# Patient Record
Sex: Female | Born: 1970 | Race: White | Hispanic: No | Marital: Single | Smoking: Current every day smoker
Health system: Southern US, Community
[De-identification: ages and names within clinical notes are randomized; demographics above are authoritative.]

## PROBLEM LIST (undated history)

## (undated) DIAGNOSIS — K859 Acute pancreatitis without necrosis or infection, unspecified: Secondary | ICD-10-CM

## (undated) DIAGNOSIS — F329 Major depressive disorder, single episode, unspecified: Secondary | ICD-10-CM

## (undated) DIAGNOSIS — Z8619 Personal history of other infectious and parasitic diseases: Secondary | ICD-10-CM

## (undated) DIAGNOSIS — J449 Chronic obstructive pulmonary disease, unspecified: Secondary | ICD-10-CM

## (undated) DIAGNOSIS — R079 Chest pain, unspecified: Secondary | ICD-10-CM

## (undated) DIAGNOSIS — M1990 Unspecified osteoarthritis, unspecified site: Secondary | ICD-10-CM

## (undated) DIAGNOSIS — R519 Headache, unspecified: Secondary | ICD-10-CM

## (undated) DIAGNOSIS — D649 Anemia, unspecified: Secondary | ICD-10-CM

## (undated) DIAGNOSIS — R51 Headache: Secondary | ICD-10-CM

## (undated) DIAGNOSIS — R0689 Other abnormalities of breathing: Secondary | ICD-10-CM

## (undated) DIAGNOSIS — J45909 Unspecified asthma, uncomplicated: Secondary | ICD-10-CM

## (undated) DIAGNOSIS — K589 Irritable bowel syndrome without diarrhea: Secondary | ICD-10-CM

## (undated) DIAGNOSIS — F419 Anxiety disorder, unspecified: Secondary | ICD-10-CM

## (undated) DIAGNOSIS — F32A Depression, unspecified: Secondary | ICD-10-CM

## (undated) DIAGNOSIS — R002 Palpitations: Secondary | ICD-10-CM

## (undated) DIAGNOSIS — R0989 Other specified symptoms and signs involving the circulatory and respiratory systems: Secondary | ICD-10-CM

## (undated) HISTORY — PX: COLONOSCOPY: SHX5424

## (undated) HISTORY — DX: Major depressive disorder, single episode, unspecified: F32.9

## (undated) HISTORY — DX: Anxiety disorder, unspecified: F41.9

## (undated) HISTORY — DX: Other abnormalities of breathing: R06.89

## (undated) HISTORY — DX: Headache: R51

## (undated) HISTORY — DX: Chronic obstructive pulmonary disease, unspecified: J44.9

## (undated) HISTORY — DX: Personal history of other infectious and parasitic diseases: Z86.19

## (undated) HISTORY — PX: SKIN GRAFT: SHX250

## (undated) HISTORY — DX: Irritable bowel syndrome, unspecified: K58.9

## (undated) HISTORY — DX: Anemia, unspecified: D64.9

## (undated) HISTORY — DX: Headache, unspecified: R51.9

## (undated) HISTORY — DX: Chest pain, unspecified: R07.9

## (undated) HISTORY — DX: Other specified symptoms and signs involving the circulatory and respiratory systems: R09.89

## (undated) HISTORY — DX: Unspecified asthma, uncomplicated: J45.909

## (undated) HISTORY — DX: Depression, unspecified: F32.A

## (undated) HISTORY — DX: Unspecified osteoarthritis, unspecified site: M19.90

## (undated) HISTORY — DX: Acute pancreatitis without necrosis or infection, unspecified: K85.90

## (undated) HISTORY — PX: CARPAL TUNNEL RELEASE: SHX101

## (undated) HISTORY — DX: Palpitations: R00.2

---

## 2009-08-28 DIAGNOSIS — F411 Generalized anxiety disorder: Secondary | ICD-10-CM | POA: Insufficient documentation

## 2009-08-28 DIAGNOSIS — H532 Diplopia: Secondary | ICD-10-CM | POA: Insufficient documentation

## 2009-08-28 DIAGNOSIS — K861 Other chronic pancreatitis: Secondary | ICD-10-CM | POA: Insufficient documentation

## 2010-02-17 DIAGNOSIS — T3165 Burns involving 60-69% of body surface with 50-59% third degree burns: Secondary | ICD-10-CM | POA: Insufficient documentation

## 2013-09-01 DIAGNOSIS — F32A Depression, unspecified: Secondary | ICD-10-CM | POA: Insufficient documentation

## 2013-09-28 DIAGNOSIS — F17200 Nicotine dependence, unspecified, uncomplicated: Secondary | ICD-10-CM | POA: Insufficient documentation

## 2013-09-28 DIAGNOSIS — F4310 Post-traumatic stress disorder, unspecified: Secondary | ICD-10-CM | POA: Insufficient documentation

## 2013-10-30 DIAGNOSIS — F1421 Cocaine dependence, in remission: Secondary | ICD-10-CM | POA: Insufficient documentation

## 2013-10-30 DIAGNOSIS — F1020 Alcohol dependence, uncomplicated: Secondary | ICD-10-CM | POA: Insufficient documentation

## 2013-10-30 DIAGNOSIS — F1420 Cocaine dependence, uncomplicated: Secondary | ICD-10-CM | POA: Insufficient documentation

## 2013-10-30 DIAGNOSIS — Z87898 Personal history of other specified conditions: Secondary | ICD-10-CM | POA: Insufficient documentation

## 2014-08-08 DIAGNOSIS — IMO0002 Reserved for concepts with insufficient information to code with codable children: Secondary | ICD-10-CM | POA: Insufficient documentation

## 2014-08-08 DIAGNOSIS — Z0000 Encounter for general adult medical examination without abnormal findings: Secondary | ICD-10-CM | POA: Insufficient documentation

## 2015-05-21 ENCOUNTER — Ambulatory Visit (INDEPENDENT_AMBULATORY_CARE_PROVIDER_SITE_OTHER): Payer: Medicaid Other | Admitting: Podiatry

## 2015-05-21 ENCOUNTER — Encounter: Payer: Self-pay | Admitting: Podiatry

## 2015-05-21 VITALS — BP 67/47 | HR 87 | Resp 18

## 2015-05-21 DIAGNOSIS — M2040 Other hammer toe(s) (acquired), unspecified foot: Secondary | ICD-10-CM | POA: Diagnosis not present

## 2015-05-21 DIAGNOSIS — L84 Corns and callosities: Secondary | ICD-10-CM

## 2015-05-21 DIAGNOSIS — Q667 Congenital pes cavus: Secondary | ICD-10-CM | POA: Diagnosis not present

## 2015-05-21 DIAGNOSIS — M216X9 Other acquired deformities of unspecified foot: Secondary | ICD-10-CM
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
## 2015-06-16 ENCOUNTER — Ambulatory Visit (INDEPENDENT_AMBULATORY_CARE_PROVIDER_SITE_OTHER): Payer: Medicaid Other | Admitting: Podiatry

## 2015-06-16 ENCOUNTER — Encounter: Payer: Self-pay | Admitting: Podiatry

## 2015-06-16 VITALS — BP 129/83 | HR 100 | Resp 18

## 2015-06-16 DIAGNOSIS — L03031 Cellulitis of right toe: Secondary | ICD-10-CM | POA: Diagnosis not present

## 2015-06-16 DIAGNOSIS — L600 Ingrowing nail: Secondary | ICD-10-CM

## 2015-06-16 DIAGNOSIS — B351 Tinea unguium: Secondary | ICD-10-CM

## 2015-06-16 MED ORDER — HYDROCODONE-ACETAMINOPHEN 10-325 MG PO TABS: 1.0000 | ORAL_TABLET | Freq: Three times a day (TID) | ORAL | Status: DC | PRN

## 2015-06-16 MED ORDER — CEPHALEXIN 500 MG PO CAPS: 500.0000 mg | ORAL_CAPSULE | Freq: Two times a day (BID) | ORAL | Status: DC

## 2015-06-16 NOTE — Progress Notes (Signed)
P atient ID: Candace Howard, female   DOB: 1971/07/28, 45 y.o.   MRN: [id number]    This patient returns to the office with severe pain developing in her gig toe right foot.  She says the toenail is discolored and has been throbbing very bad.  She is experiencing severe pain walking.  There is mild redness but no drainage noted.  She presents for evaluation and treatment.  O  Dorsalis pedis and posterior tibial pulses are palpable B/L.  TG and CR WNL.  Her right hallux toenail is yellow and unattached to her nail plate.  Mild redness at proximal nail fold.    A.  Paronychia right hallux  Onychomycosis right hallux.  P.  Excision of nail and matrix right hallux toenail under local anesthesia. Neosporin/DSD.  I was unable to get full anesthesia on this patient.  I discovered she had pus under her nail as well as a membrane which was exquisitely painful despite 6cc of 2% lidocaine. Prescibed cephalexin and vicodin 7.5/300 mg for this patient.  RTC 1 week.

## 2015-06-25 ENCOUNTER — Encounter: Payer: Self-pay | Admitting: Podiatry

## 2015-06-25 ENCOUNTER — Ambulatory Visit: Payer: Medicaid Other | Admitting: Podiatry

## 2015-06-25 ENCOUNTER — Ambulatory Visit (INDEPENDENT_AMBULATORY_CARE_PROVIDER_SITE_OTHER): Payer: Medicaid Other | Admitting: Podiatry

## 2015-06-25 VITALS — BP 134/78 | HR 86 | Resp 12

## 2015-06-25 DIAGNOSIS — Z09 Encounter for follow-up examination after completed treatment for conditions other than malignant neoplasm: Secondary | ICD-10-CM

## 2015-06-25 NOTE — Progress Notes (Signed)
Patient ID: Candace PriestSharon Tuman, female   DOB: [birth year], 44 y.o.   MRN: [id number] This patient presents to the office following nail surgery for permanent removal right big toe.  She says her toe is extremely painful still.  It was difficult removing her bandage.  She says the toe looks good to her but is still painful.    Podiatric Exam: Vascular: dorsalis pedis and posterior tibial pulses are palpable bilateral. Capillary return is immediate. Temperature gradient is WNL. Skin turgor WNL  Sensorium: Normal Semmes Weinstein monofilament test. Normal tactile sensation bilaterally. Nail Exam: Examination of her right great toe reveals significant white tissue which is painful.  There is no evidence of redness or infection noted right hallux. Ulcer Exam: There is no evidence of ulcer or pre-ulcerative changes or infection. Orthopedic Exam: Muscle tone and strength are WNL. No limitations in general ROM. No crepitus or effusions noted.   S/p nail surgery right foot  P.  There is significanr masceration due to allergy to her bandage or due to her neosporin.  The toe is healing well aside from her skin masceration .  Told her to discontinue neosporin usage and air dry. RTC 2 weeks.

## 2015-07-04 ENCOUNTER — Telehealth: Payer: Self-pay | Admitting: *Deleted

## 2015-07-04 NOTE — Telephone Encounter (Signed)
Pt called stated, "I want my Oxycodone."  I informed pt that Oxycodone had not be prescribed by Dr. Stacie AcresMayer, and Vicodin that was prescribed after her procedure was a one time rx.  I instructed pt to begin [employer] soaks and apply antibiotic ointment, and if she could tolerate Ibuprofen take it as prescribed on the OTC bottle for the discomfort and inflammation.  Pt agreed.

## 2015-07-09 ENCOUNTER — Encounter: Payer: Self-pay | Admitting: Podiatry

## 2015-07-09 ENCOUNTER — Ambulatory Visit (INDEPENDENT_AMBULATORY_CARE_PROVIDER_SITE_OTHER): Payer: Medicaid Other | Admitting: Podiatry

## 2015-07-09 VITALS — BP 122/78 | HR 87 | Resp 18

## 2015-07-09 DIAGNOSIS — Z09 Encounter for follow-up examination after completed treatment for conditions other than malignant neoplasm: Secondary | ICD-10-CM

## 2015-07-09 NOTE — Progress Notes (Signed)
Subjective:     Patient ID: Candace PriestSharon Howard, female   DOB: [birth year], 44 y.o.   MRN: [id number]  HPIThis patient returns following nail surgery for removal of big toenail right foot.  She says her toe is occasionally painful but she has no drainage and is walking better.  She presents for evaluation and treatment.   Review of Systems     Objective:   Physical Exam GENERAL APPEARANCE: Alert, conversant. Appropriately groomed. No acute distress.  VASCULAR: Pedal pulses palpable at 2/4 DP and PT bilateral.  Capillary refill time is immediate to all digits,  Proximal to distal cooling it warm to warm.  Digital hair growth is present bilateral  NEUROLOGIC: sensation is intact epicritically and protectively to 5.07 monofilament at 5/5 sites bilateral.  Light touch is intact bilateral, vibratory sensation intact bilateral, achilles tendon reflex is intact bilateral.  MUSCULOSKELETAL: acceptable muscle strength, tone and stability bilateral.  Intrinsic muscluature intact bilateral.  Rectus appearance of foot and digits noted bilateral.   DERMATOLOGIC: skin color, texture, and turgor are within normal limits.  No preulcerative lesions or ulcers  are seen, no interdigital maceration noted.  No open lesions present.  . No drainage noted. NAILS  The right hallux nail bed has no drainage.  There is hard callus present with no signs of redness or drainage or infection.    Assessment:     S/p nail surgery right foot     Plan:     ROV  Told to vaseline the nail bed.

## 2015-07-16 ENCOUNTER — Ambulatory Visit: Payer: Medicaid Other | Admitting: Podiatry

## 2017-10-13 ENCOUNTER — Ambulatory Visit: Payer: Medicaid Other | Admitting: Cardiology

## 2017-10-17 ENCOUNTER — Ambulatory Visit: Payer: Medicaid Other | Admitting: Cardiology

## 2017-10-24 ENCOUNTER — Encounter: Payer: Self-pay | Admitting: Cardiology

## 2017-10-24 ENCOUNTER — Ambulatory Visit: Payer: Medicaid Other | Admitting: Cardiology

## 2017-10-24 DIAGNOSIS — Z87891 Personal history of nicotine dependence: Secondary | ICD-10-CM | POA: Insufficient documentation

## 2017-10-24 DIAGNOSIS — F17200 Nicotine dependence, unspecified, uncomplicated: Secondary | ICD-10-CM | POA: Insufficient documentation

## 2017-10-24 DIAGNOSIS — E785 Hyperlipidemia, unspecified: Secondary | ICD-10-CM | POA: Insufficient documentation

## 2017-10-24 DIAGNOSIS — R0609 Other forms of dyspnea: Secondary | ICD-10-CM | POA: Insufficient documentation

## 2017-10-24 DIAGNOSIS — R0789 Other chest pain: Secondary | ICD-10-CM | POA: Diagnosis not present

## 2017-10-24 NOTE — Patient Instructions (Signed)
Medication Instructions:  Your physician recommends that you continue on your current medications as directed. Please refer to the Current Medication list given to you today.  Labwork: None   Testing/Procedures: Your physician has requested that you have an echocardiogram. Echocardiography is a painless test that uses sound waves to create images of your heart. It provides your doctor with information about the size and shape of your heart and how well your heart's chambers and valves are working. This procedure takes approximately one hour. There are no restrictions for this procedure.  Your physician has requested that you have a stress echocardiogram. For further information please visit [email or web address]. Please follow instruction sheet as given.  EKG today in office.   Follow-Up: Your physician recommends that you schedule a follow-up appointment in: 1 month  Any Other Special Instructions Will Be Listed Below (If Applicable).  Please note that any paperwork needing to be filled out by the provider will need to be addressed at the front desk prior to seeing the provider. Please note that any paperwork FMLA, Disability or other documents regarding health condition is subject to a $25.00 charge that must be received prior to completion of paperwork in the form of a money order or check.    If you need a refill on your cardiac medications before your next appointment, please call your pharmacy.
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
## 2017-11-01 DIAGNOSIS — L84 Corns and callosities: Secondary | ICD-10-CM | POA: Insufficient documentation

## 2017-11-09 ENCOUNTER — Other Ambulatory Visit (HOSPITAL_BASED_OUTPATIENT_CLINIC_OR_DEPARTMENT_OTHER): Payer: Medicaid Other

## 2017-11-16 ENCOUNTER — Other Ambulatory Visit (HOSPITAL_BASED_OUTPATIENT_CLINIC_OR_DEPARTMENT_OTHER): Payer: Medicaid Other

## 2017-11-21 ENCOUNTER — Other Ambulatory Visit (HOSPITAL_BASED_OUTPATIENT_CLINIC_OR_DEPARTMENT_OTHER): Payer: Medicaid Other

## 2017-11-21 ENCOUNTER — Ambulatory Visit (HOSPITAL_BASED_OUTPATIENT_CLINIC_OR_DEPARTMENT_OTHER)
Admission: RE | Admit: 2017-11-21 | Discharge: 2017-11-21 | Disposition: A | Discharge status: Home / Self Care | Payer: Medicaid Other | Source: Ambulatory Visit | Attending: Cardiology | Admitting: Cardiology

## 2017-11-21 DIAGNOSIS — R0609 Other forms of dyspnea: Secondary | ICD-10-CM | POA: Insufficient documentation

## 2017-11-21 DIAGNOSIS — R0600 Dyspnea, unspecified: Secondary | ICD-10-CM | POA: Insufficient documentation

## 2017-11-21 DIAGNOSIS — Z720 Tobacco use: Secondary | ICD-10-CM | POA: Diagnosis not present

## 2017-11-21 DIAGNOSIS — I340 Nonrheumatic mitral (valve) insufficiency: Secondary | ICD-10-CM | POA: Insufficient documentation

## 2017-11-21 DIAGNOSIS — R0789 Other chest pain: Secondary | ICD-10-CM | POA: Insufficient documentation

## 2017-11-21 DIAGNOSIS — E785 Hyperlipidemia, unspecified: Secondary | ICD-10-CM | POA: Insufficient documentation

## 2017-11-21 NOTE — Progress Notes (Signed)
Echocardiogram 2D Echocardiogram has been performed.  Candace Howard, Candace Howard 11/21/2017, 10:37 AM

## 2017-11-21 NOTE — Progress Notes (Signed)
  Echocardiogram 2D Echocardiogram has been performed.  Candace BasemanReel, Yuridia Couts M 11/21/2017, 10:43 AM

## 2017-11-23 ENCOUNTER — Ambulatory Visit: Payer: Medicaid Other | Admitting: Cardiology

## 2017-11-28 ENCOUNTER — Other Ambulatory Visit (HOSPITAL_BASED_OUTPATIENT_CLINIC_OR_DEPARTMENT_OTHER): Payer: Medicaid Other

## 2017-11-28 ENCOUNTER — Ambulatory Visit (HOSPITAL_BASED_OUTPATIENT_CLINIC_OR_DEPARTMENT_OTHER): Payer: Medicaid Other

## 2017-12-05 ENCOUNTER — Encounter: Payer: Self-pay | Admitting: Cardiology

## 2017-12-05 ENCOUNTER — Ambulatory Visit (INDEPENDENT_AMBULATORY_CARE_PROVIDER_SITE_OTHER): Payer: Medicaid Other | Admitting: Cardiology

## 2017-12-05 VITALS — BP 110/70 | HR 66 | Ht 63.5 in | Wt 150.4 lb

## 2017-12-05 DIAGNOSIS — F17200 Nicotine dependence, unspecified, uncomplicated: Secondary | ICD-10-CM | POA: Diagnosis not present

## 2017-12-05 DIAGNOSIS — R0609 Other forms of dyspnea: Secondary | ICD-10-CM | POA: Diagnosis not present

## 2017-12-05 DIAGNOSIS — R0789 Other chest pain: Secondary | ICD-10-CM | POA: Diagnosis not present

## 2017-12-05 DIAGNOSIS — E785 Hyperlipidemia, unspecified: Secondary | ICD-10-CM | POA: Diagnosis not present

## 2017-12-05 NOTE — Patient Instructions (Signed)
Medication Instructions:  Your physician recommends that you continue on your current medications as directed. Please refer to the Current Medication list given to you today.  Labwork: Your physician recommends that you have lab work today: Lipid panel to check your cholesterol  Testing/Procedures: None ordered  Follow-Up: Your physician recommends that you schedule a follow-up appointment in: 1 month with Dr. Bing MatterKrasowski  Any Other Special Instructions Will Be Listed Below (If Applicable).     If you need a refill on your cardiac medications before your next appointment, please call your pharmacy.

## 2017-12-05 NOTE — Addendum Note (Signed)
Addended by: Arville CareHUNT, Yang Rack N on: 12/05/2017 10:19 AM   Modules accepted: Orders
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
## 2017-12-06 LAB — LIPID PANEL
CHOL/HDL RATIO: 4.6 ratio — AB (ref 0.0–4.4)
Cholesterol, Total: 165 mg/dL (ref 100–199)
HDL: 36 mg/dL — ABNORMAL LOW (ref >39)
LDL Calculated: 87 mg/dL (ref 0–99)
TRIGLYCERIDES: 208 mg/dL — AB (ref 0–149)
VLDL Cholesterol Cal: 42 mg/dL — ABNORMAL HIGH (ref 5–40)

## 2017-12-08 ENCOUNTER — Other Ambulatory Visit: Payer: Self-pay

## 2017-12-08 MED ORDER — ATORVASTATIN CALCIUM 40 MG PO TABS: 40.0000 mg | ORAL_TABLET | Freq: Every day | ORAL | 6 refills | Status: DC

## 2017-12-19 ENCOUNTER — Ambulatory Visit (HOSPITAL_BASED_OUTPATIENT_CLINIC_OR_DEPARTMENT_OTHER): Payer: Medicaid Other

## 2018-01-09 ENCOUNTER — Ambulatory Visit: Payer: Medicaid Other | Admitting: Cardiology

## 2018-01-09 ENCOUNTER — Other Ambulatory Visit (HOSPITAL_BASED_OUTPATIENT_CLINIC_OR_DEPARTMENT_OTHER): Payer: Medicaid Other

## 2018-05-19 DIAGNOSIS — Z01818 Encounter for other preprocedural examination: Secondary | ICD-10-CM | POA: Diagnosis not present

## 2018-07-04 DIAGNOSIS — L03115 Cellulitis of right lower limb: Secondary | ICD-10-CM | POA: Diagnosis not present

## 2018-07-04 DIAGNOSIS — L02611 Cutaneous abscess of right foot: Secondary | ICD-10-CM

## 2018-07-05 DIAGNOSIS — L03115 Cellulitis of right lower limb: Secondary | ICD-10-CM | POA: Diagnosis not present

## 2018-07-05 DIAGNOSIS — L02611 Cutaneous abscess of right foot: Secondary | ICD-10-CM | POA: Diagnosis not present

## 2018-07-06 ENCOUNTER — Encounter: Payer: Self-pay | Admitting: Sports Medicine

## 2018-07-06 DIAGNOSIS — L03119 Cellulitis of unspecified part of limb: Secondary | ICD-10-CM

## 2018-07-06 DIAGNOSIS — L0291 Cutaneous abscess, unspecified: Secondary | ICD-10-CM

## 2018-07-06 DIAGNOSIS — L03115 Cellulitis of right lower limb: Secondary | ICD-10-CM | POA: Diagnosis not present

## 2018-07-06 DIAGNOSIS — L02611 Cutaneous abscess of right foot: Secondary | ICD-10-CM | POA: Diagnosis not present

## 2018-07-07 DIAGNOSIS — L02611 Cutaneous abscess of right foot: Secondary | ICD-10-CM | POA: Diagnosis not present

## 2018-07-07 DIAGNOSIS — L03115 Cellulitis of right lower limb: Secondary | ICD-10-CM | POA: Diagnosis not present

## 2018-07-19 ENCOUNTER — Encounter: Payer: Self-pay | Admitting: Sports Medicine

## 2018-07-19 ENCOUNTER — Ambulatory Visit (INDEPENDENT_AMBULATORY_CARE_PROVIDER_SITE_OTHER): Payer: Medicaid Other | Admitting: Sports Medicine

## 2018-07-19 ENCOUNTER — Encounter: Payer: Self-pay | Admitting: *Deleted

## 2018-07-19 VITALS — BP 109/58 | HR 74 | Temp 97.8°F | Resp 16

## 2018-07-19 DIAGNOSIS — Z9889 Other specified postprocedural states: Secondary | ICD-10-CM

## 2018-07-19 DIAGNOSIS — M79671 Pain in right foot: Secondary | ICD-10-CM

## 2018-07-19 MED ORDER — TRAMADOL HCL 50 MG PO TABS: 50.0000 mg | ORAL_TABLET | Freq: Four times a day (QID) | ORAL | 0 refills | Status: AC | PRN

## 2018-07-19 NOTE — Progress Notes (Signed)
Subjective: Candace Howard is a 47 y.o. female patient seen today in office for POV #1 (DOS 07-06-18), S/P right foot incision and drainage for abscess.  Patient admits pain at surgical site 5 out of 10 that sharp in nature admits that the special postoperative boot/which shoe is hurting her ankle states that she did have an episode on Monday night of nausea and vomiting however this has resolved and reports that she has been going weekly to the wound care center for local wound care.  Patient states that she is out of her tramadol to help with her pain but she has been trying to use her diclofenac or Voltaren which is not helping as much as the tramadol does.  Patient denies  calf pain, denies headache, chest pain, shortness of breath, nausea, vomiting, fever, or chills currently. No other issues noted.   Patient Active Problem List   Diagnosis Date Noted  . Atypical chest pain 10/24/2017  . Dyslipidemia 10/24/2017  . Smoking 10/24/2017  . Dyspnea on exertion 10/24/2017    Current Outpatient Medications on File Prior to Visit  Medication Sig Dispense Refill  . albuterol (PROAIR HFA) 108 (90 Base) MCG/ACT inhaler Inhale 2 puffs into the lungs every 6 (six) hours as needed for wheezing or shortness of breath.    Marland Kitchen. atorvastatin (LIPITOR) 40 MG tablet Take 1 tablet (40 mg total) by mouth daily. 30 tablet 6  . clonazePAM (KLONOPIN) 1 MG tablet Take 1 mg by mouth daily.    Marland Kitchen. gabapentin (NEURONTIN) 300 MG capsule Take 300 mg by mouth 3 (three) times daily.    . hydrOXYzine (ATARAX/VISTARIL) 25 MG tablet Take 25 mg by mouth 3 (three) times daily.    Marland Kitchen. omeprazole (PRILOSEC) 40 MG capsule Take 40 mg by mouth daily.    . promethazine (PHENERGAN) 25 MG tablet Take 6.25 mg by mouth as needed for nausea or vomiting.    . sertraline (ZOLOFT) 100 MG tablet Take 1 tablet by mouth daily.  0  . SUBOXONE 8-2 MG FILM place 2.5 films UNDER THE TONGUE DAILY  0  . tiotropium (SPIRIVA) 18 MCG inhalation capsule Place 18  mcg daily into inhaler and inhale.     No current facility-administered medications on file prior to visit.     No Known Allergies  Objective: There were no vitals filed for this visit.  General: No acute distress, AAOx3  Right foot: Retention Prolene sutures intact with no gapping or dehiscence at surgical site there is a granular I&D ulceration site that appears to be much improved in size compared to immediate postop there is mild surrounding soft tissue bruising, mild swelling t right footo, no erythema, no warmth, no active drainage, no signs of infection noted, Capillary fill time <3 seconds in all digits, gross sensation present via light touch to right foot.  Mild guarding with range of motion to the right foot.  No reproducible pain at the ankle however subjectively worse pain with wedge postop shoe.  No pain with calf compression.   Assessment and Plan:  Problem List Items Addressed This Visit    None    Visit Diagnoses    Right foot pain    -  Primary   Post-operative state           -Patient seen and evaluated -Applied dry sterile dressing to surgical site right foot secured with ACE wrap and stockinet  -Advised patient to make sure to keep dressings clean, dry, and intact to the right  foot and allow wound care center to assist with dressing changes of which they are currently using a calcium AlgiSite dressing to the area -Advised patient to continue with post-op shoe on right foot: Switch shoe from wedge shoe to flat postop shoe due to patient having history of pain in the ankle  -Prescribed tramadol to take as needed for pain and advised patient to continue with diclofenac as needed for mild pain and inflammation -Advised patient to limit activity to necessity  -Advised patient to ice and elevate as necessary  -Will plan for  follow-up wound check at next office visit. In the meantime, patient to call office if any issues or problems arise.   Asencion Islam, DPM

## 2018-07-25 ENCOUNTER — Telehealth: Payer: Self-pay | Admitting: Sports Medicine

## 2018-07-25 NOTE — Telephone Encounter (Signed)
I would like to get a copy of my medical records. Please call me as soon as you have them ready and I will come pick them up. You can reach me at [phone].

## 2018-07-27 ENCOUNTER — Telehealth: Payer: Self-pay | Admitting: Sports Medicine

## 2018-07-27 NOTE — Telephone Encounter (Signed)
I saw Dr. Marylene LandStover on Wednesday and my right ankle is swollen up and I don't know what from. If someone can give me a call back and tell me what I need to do, I would greatly appreciate it. My number is [phone]. Thank you.

## 2018-07-28 ENCOUNTER — Encounter: Payer: Self-pay | Admitting: Sports Medicine

## 2018-07-28 ENCOUNTER — Telehealth: Payer: Self-pay | Admitting: *Deleted

## 2018-07-28 ENCOUNTER — Ambulatory Visit: Payer: Self-pay

## 2018-07-28 ENCOUNTER — Ambulatory Visit (INDEPENDENT_AMBULATORY_CARE_PROVIDER_SITE_OTHER): Payer: Medicaid Other | Admitting: Sports Medicine

## 2018-07-28 ENCOUNTER — Telehealth: Payer: Self-pay | Admitting: Sports Medicine

## 2018-07-28 DIAGNOSIS — L03115 Cellulitis of right lower limb: Secondary | ICD-10-CM | POA: Diagnosis not present

## 2018-07-28 DIAGNOSIS — Z9889 Other specified postprocedural states: Secondary | ICD-10-CM

## 2018-07-28 DIAGNOSIS — M25579 Pain in unspecified ankle and joints of unspecified foot: Secondary | ICD-10-CM

## 2018-07-28 MED ORDER — SULFAMETHOXAZOLE-TRIMETHOPRIM 800-160 MG PO TABS: 1.0000 | ORAL_TABLET | Freq: Two times a day (BID) | ORAL | 0 refills | Status: DC

## 2018-07-28 MED ORDER — DICLOFENAC SODIUM 75 MG PO TBEC
75.0000 mg | DELAYED_RELEASE_TABLET | Freq: Two times a day (BID) | ORAL | 0 refills | Status: DC
Start: 2018-07-28 — End: 2021-09-29

## 2018-07-28 NOTE — Telephone Encounter (Signed)
I spoke with Dr. Wynema BirchStover's - M. Juan QuamParry, CMA and she stated Dr. Marylene LandStover would want to see her today. I informed pt and she stated she would get someone to drop her off there today. I told pt if she could not get to the [facility] in [place] to go directly to the ED. Pt states understanding.

## 2018-07-28 NOTE — Progress Notes (Signed)
Subjective: Candace PriestSharon Howard is a 47 y.o. female patient seen today in office for right ankle pain states that on Wednesday she thinks she may have twisted the ankle but has noted significant swelling and redness and has been painful for the last day where pressure or anything touching it or wrapped around her ankle makes it worse states that icing helps.  Patient is also status post right foot incision and drainage for abscess performed on 07/06/2018.  Patient denies any pain to the surgical site and states that her dressing was just changed earlier today at the wound care center, denies calf pain, denies headache, chest pain, shortness of breath, nausea, vomiting, fever, or chills currently. No other issues noted.   Patient Active Problem List   Diagnosis Date Noted  . Atypical chest pain 10/24/2017  . Dyslipidemia 10/24/2017  . Smoking 10/24/2017  . Dyspnea on exertion 10/24/2017    Current Outpatient Medications on File Prior to Visit  Medication Sig Dispense Refill  . albuterol (PROAIR HFA) 108 (90 Base) MCG/ACT inhaler Inhale 2 puffs into the lungs every 6 (six) hours as needed for wheezing or shortness of breath.    Marland Kitchen. atorvastatin (LIPITOR) 40 MG tablet Take 1 tablet (40 mg total) by mouth daily. 30 tablet 6  . clonazePAM (KLONOPIN) 1 MG tablet Take 1 mg by mouth daily.    Marland Kitchen. gabapentin (NEURONTIN) 300 MG capsule Take 300 mg by mouth 3 (three) times daily.    . hydrOXYzine (ATARAX/VISTARIL) 25 MG tablet Take 25 mg by mouth 3 (three) times daily.    Marland Kitchen. omeprazole (PRILOSEC) 40 MG capsule Take 40 mg by mouth daily.    . promethazine (PHENERGAN) 25 MG tablet Take 6.25 mg by mouth as needed for nausea or vomiting.    . sertraline (ZOLOFT) 100 MG tablet Take 1 tablet by mouth daily.  0  . SUBOXONE 8-2 MG FILM place 2.5 films UNDER THE TONGUE DAILY  0  . tiotropium (SPIRIVA) 18 MCG inhalation capsule Place 18 mcg daily into inhaler and inhale.     No current facility-administered medications on  file prior to visit.     No Known Allergies  Objective: There were no vitals filed for this visit.  General: No acute distress, AAOx3  Right foot/ankle: Collagen dressing in place over wound bed plantar aspect of first metatarsal right foot, no erythema, no warmth, no active drainage, no signs of infection noted, Capillary fill time <3 seconds in all digits, gross sensation present via light touch to right foot.  Mild guarding with range of motion to the right foot and ankle there is local swelling redness and warmth to the anterior and lateral aspect of the right ankle no pain or tenderness to calf no acute signs of DVT mild guarding with range of motion at the ankle.   X-rays right ankle No acute findings  Assessment and Plan:  Problem List Items Addressed This Visit    None    Visit Diagnoses    Acute ankle pain, unspecified laterality    -  Primary   Relevant Medications   diclofenac (VOLTAREN) 75 MG EC tablet   Other Relevant Orders   DG Ankle Complete Right   Post-operative state       Relevant Orders   DG Ankle Complete Right   Cellulitis of right lower extremity       Relevant Medications   sulfamethoxazole-trimethoprim (BACTRIM DS,SEPTRA DS) 800-160 MG tablet       -Patient seen and evaluated -  Applied dry sterile dressing to surgical site right foot secured with ACE wrap and stockinet  -Advised patient to make sure to keep dressings clean, dry, and intact to the right foot and allow wound care center to assist with dressing changes of which they are currently using a calcium AlgiSite dressing to the area -Advised patient to continue with post-op shoe on right foot -Prescribed  diclofenac as needed for mild pain and inflammation and advised patient to continue to ice for her ankle and also prescribed Bactrim for the redness and warmth to her leg and advised patient if worsen to go to ER -Advised patient to limit activity to necessity  -Will plan for  follow-up wound  check at next office visit. In the meantime, patient to call office if any issues or problems arise.   Asencion Islam, DPM

## 2018-07-28 NOTE — Telephone Encounter (Signed)
Pt called states her right leg is red, swollen, hot and very tender, the redness is going up the front of her leg, does Dr. Marylene LandStover want to see her now.

## 2018-07-28 NOTE — Telephone Encounter (Signed)
error 

## 2018-07-31 NOTE — Telephone Encounter (Signed)
Patient was seen in office on Friday 07/28/18

## 2018-08-02 ENCOUNTER — Encounter: Payer: Medicaid Other | Admitting: Sports Medicine

## 2018-09-14 ENCOUNTER — Ambulatory Visit: Payer: Medicaid Other | Admitting: Sports Medicine

## 2018-09-15 ENCOUNTER — Ambulatory Visit: Payer: Medicaid Other | Admitting: Sports Medicine

## 2018-09-21 ENCOUNTER — Ambulatory Visit (INDEPENDENT_AMBULATORY_CARE_PROVIDER_SITE_OTHER): Payer: Medicaid Other | Admitting: Sports Medicine

## 2018-09-21 ENCOUNTER — Telehealth: Payer: Self-pay | Admitting: *Deleted

## 2018-09-21 ENCOUNTER — Encounter: Payer: Self-pay | Admitting: Sports Medicine

## 2018-09-21 DIAGNOSIS — M79671 Pain in right foot: Secondary | ICD-10-CM

## 2018-09-21 DIAGNOSIS — M25579 Pain in unspecified ankle and joints of unspecified foot: Secondary | ICD-10-CM

## 2018-09-21 DIAGNOSIS — L03032 Cellulitis of left toe: Secondary | ICD-10-CM | POA: Diagnosis not present

## 2018-09-21 DIAGNOSIS — M7989 Other specified soft tissue disorders: Secondary | ICD-10-CM

## 2018-09-21 DIAGNOSIS — M25571 Pain in right ankle and joints of right foot: Secondary | ICD-10-CM

## 2018-09-21 DIAGNOSIS — L84 Corns and callosities: Secondary | ICD-10-CM

## 2018-09-21 DIAGNOSIS — L03115 Cellulitis of right lower limb: Secondary | ICD-10-CM

## 2018-09-21 DIAGNOSIS — M79672 Pain in left foot: Secondary | ICD-10-CM

## 2018-09-21 NOTE — Telephone Encounter (Signed)
-----   Message from Asencion Islam, [place] sent at 09/21/2018  2:41 PM EDT ----- Regarding: [facility] Ultrasound at right lower leg and ankle Soft tissue mass at ankle that is concerning for the patient
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
## 2018-09-22 NOTE — Telephone Encounter (Signed)
I informed pt of the 09/26/2018 [facility] [place] appt.

## 2018-09-22 NOTE — Telephone Encounter (Signed)
[employer] - MEDICAID APPROVED THE [place] [id number], SERVICE ORDER: [id number], AUTHORIZATION  EFFECTIVE DATE:  09/22/2018, AUTHORIZATION END DATE: 10/22/2018.

## 2018-09-22 NOTE — Telephone Encounter (Signed)
[facility] scheduled 09/26/2018 arrive 2:30pm for 3:00pm [place]. Faxed orders and [employer] authorization to [employer].

## 2018-09-29 ENCOUNTER — Encounter: Payer: Self-pay | Admitting: Sports Medicine

## 2021-07-05 ENCOUNTER — Other Ambulatory Visit: Payer: Self-pay

## 2021-07-05 ENCOUNTER — Ambulatory Visit (HOSPITAL_COMMUNITY)
Admission: EM | Admit: 2021-07-05 | Discharge: 2021-07-05 | Discharge status: Against Medical Advice | Payer: Medicaid Other

## 2021-09-29 ENCOUNTER — Ambulatory Visit: Payer: Medicaid Other | Admitting: Sports Medicine

## 2021-09-29 ENCOUNTER — Encounter: Payer: Self-pay | Admitting: Sports Medicine

## 2021-09-29 ENCOUNTER — Other Ambulatory Visit: Payer: Self-pay

## 2021-09-29 ENCOUNTER — Ambulatory Visit: Payer: Medicaid Other

## 2021-09-29 DIAGNOSIS — I739 Peripheral vascular disease, unspecified: Secondary | ICD-10-CM

## 2021-09-29 DIAGNOSIS — L97909 Non-pressure chronic ulcer of unspecified part of unspecified lower leg with unspecified severity: Secondary | ICD-10-CM | POA: Insufficient documentation

## 2021-09-29 DIAGNOSIS — I889 Nonspecific lymphadenitis, unspecified: Secondary | ICD-10-CM | POA: Insufficient documentation

## 2021-09-29 DIAGNOSIS — L03119 Cellulitis of unspecified part of limb: Secondary | ICD-10-CM | POA: Insufficient documentation

## 2021-09-29 DIAGNOSIS — L97421 Non-pressure chronic ulcer of left heel and midfoot limited to breakdown of skin: Secondary | ICD-10-CM | POA: Diagnosis not present

## 2021-09-29 DIAGNOSIS — M79672 Pain in left foot: Secondary | ICD-10-CM

## 2021-09-29 DIAGNOSIS — L0291 Cutaneous abscess, unspecified: Secondary | ICD-10-CM | POA: Insufficient documentation

## 2021-09-29 DIAGNOSIS — M19049 Primary osteoarthritis, unspecified hand: Secondary | ICD-10-CM | POA: Insufficient documentation
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
## 2021-10-07 LAB — WOUND CULTURE

## 2021-10-14 ENCOUNTER — Ambulatory Visit: Payer: Medicaid Other | Admitting: Sports Medicine

## 2021-11-17 ENCOUNTER — Telehealth: Payer: Self-pay

## 2021-11-17 ENCOUNTER — Other Ambulatory Visit: Payer: Self-pay

## 2021-11-17 ENCOUNTER — Ambulatory Visit: Payer: Medicaid Other | Admitting: Sports Medicine

## 2021-11-17 DIAGNOSIS — L97421 Non-pressure chronic ulcer of left heel and midfoot limited to breakdown of skin: Secondary | ICD-10-CM

## 2021-11-17 DIAGNOSIS — I739 Peripheral vascular disease, unspecified: Secondary | ICD-10-CM | POA: Diagnosis not present

## 2021-11-17 DIAGNOSIS — M79672 Pain in left foot: Secondary | ICD-10-CM | POA: Diagnosis not present

## 2021-11-17 MED ORDER — PROMETHAZINE HCL 25 MG PO TABS: 25.0000 mg | ORAL_TABLET | Freq: Three times a day (TID) | ORAL | 0 refills | Status: DC | PRN

## 2021-11-17 MED ORDER — AMOXICILLIN-POT CLAVULANATE 875-125 MG PO TABS: 1.0000 | ORAL_TABLET | Freq: Two times a day (BID) | ORAL | 0 refills | Status: DC

## 2021-11-17 NOTE — Telephone Encounter (Signed)
Candace Howard from [facility] called stating they would not be able to take in the pt due to staff issues
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
## 2021-11-17 NOTE — Telephone Encounter (Signed)
Tried contacting pt but phone sounded disconnected, was not able to speak to pt.

## 2021-12-09 ENCOUNTER — Ambulatory Visit: Payer: Medicaid Other | Admitting: Sports Medicine

## 2021-12-24 ENCOUNTER — Emergency Department (HOSPITAL_COMMUNITY): Payer: Medicaid Other

## 2021-12-24 ENCOUNTER — Other Ambulatory Visit: Payer: Self-pay

## 2021-12-24 ENCOUNTER — Encounter (HOSPITAL_COMMUNITY): Payer: Self-pay

## 2021-12-24 ENCOUNTER — Emergency Department (HOSPITAL_COMMUNITY)
Admission: EM | Admit: 2021-12-24 | Discharge: 2021-12-26 | Disposition: A | Discharge status: Other Acute Inpatient Hospital | Payer: Medicaid Other | Attending: Emergency Medicine | Admitting: Emergency Medicine

## 2021-12-24 DIAGNOSIS — F333 Major depressive disorder, recurrent, severe with psychotic symptoms: Secondary | ICD-10-CM | POA: Diagnosis not present

## 2021-12-24 DIAGNOSIS — F419 Anxiety disorder, unspecified: Secondary | ICD-10-CM | POA: Insufficient documentation

## 2021-12-24 DIAGNOSIS — F4310 Post-traumatic stress disorder, unspecified: Secondary | ICD-10-CM | POA: Insufficient documentation

## 2021-12-24 DIAGNOSIS — R45851 Suicidal ideations: Secondary | ICD-10-CM

## 2021-12-24 DIAGNOSIS — Z20822 Contact with and (suspected) exposure to covid-19: Secondary | ICD-10-CM | POA: Diagnosis not present

## 2021-12-24 DIAGNOSIS — Y900 Blood alcohol level of less than 20 mg/100 ml: Secondary | ICD-10-CM | POA: Diagnosis not present

## 2021-12-24 NOTE — ED Provider Triage Note (Signed)
Emergency Medicine Provider Triage Evaluation Note  Candace Howard , a 51 y.o. female  was evaluated in triage.  Pt complains of suicidal ideations and visual hallucinations.  Patient states she is been struggling mentally and has a lot on her plate.  She has not used any drugs in the last 40 days.  She has been off her risperidone for the last 3 months.  She is complaining of left ankle pain where she had a previous infection.  Review of Systems  Positive:  Negative: See above   Physical Exam  BP 131/89 (BP Location: Left Arm)    Pulse (!) 104    Temp 98 F (36.7 C) (Oral)    Resp 20    Ht 5\' 4"  (1.626 m)    Wt 64.9 kg    SpO2 100%    BMI 24.55 kg/m  Gen:   Awake, no distress   Resp:  Normal effort  MSK:   Moves extremities without difficulty  Other:    Medical Decision Making  Medically screening exam initiated at 10:48 PM.  Appropriate orders placed.  Candace Howard was informed that the remainder of the evaluation will be completed by another provider, this initial triage assessment does not replace that evaluation, and the importance of remaining in the ED until their evaluation is complete.     Candace Howard, [place] 12/24/21 2249

## 2021-12-24 NOTE — ED Triage Notes (Signed)
Pt reports suicidal ideation with no plan. Says she has not been getting her Risperdal shots in several months.

## 2021-12-25 ENCOUNTER — Other Ambulatory Visit: Payer: Self-pay | Admitting: Psychiatry

## 2021-12-25 LAB — CBC
HCT: 40.9 % (ref 36.0–46.0)
Hemoglobin: 13.2 g/dL (ref 12.0–15.0)
MCH: 31.5 pg (ref 26.0–34.0)
MCHC: 32.3 g/dL (ref 30.0–36.0)
MCV: 97.6 fL (ref 80.0–100.0)
Platelets: 269 10*3/uL (ref 150–400)
RBC: 4.19 MIL/uL (ref 3.87–5.11)
RDW: 14 % (ref 11.5–15.5)
WBC: 8.4 10*3/uL (ref 4.0–10.5)
nRBC: 0 % (ref 0.0–0.2)

## 2021-12-25 LAB — COMPREHENSIVE METABOLIC PANEL
ALT: 16 U/L (ref 0–44)
AST: 19 U/L (ref 15–41)
Albumin: 4 g/dL (ref 3.5–5.0)
Alkaline Phosphatase: 92 U/L (ref 38–126)
Anion gap: 12 (ref 5–15)
BUN: 6 mg/dL (ref 6–20)
CO2: 26 mmol/L (ref 22–32)
Calcium: 8.7 mg/dL — ABNORMAL LOW (ref 8.9–10.3)
Chloride: 96 mmol/L — ABNORMAL LOW (ref 98–111)
Creatinine, Ser: 0.75 mg/dL (ref 0.44–1.00)
GFR, Estimated: >60 mL/min (ref >60)
Glucose, Bld: 126 mg/dL — ABNORMAL HIGH (ref 70–99)
Potassium: 4.3 mmol/L (ref 3.5–5.1)
Sodium: 134 mmol/L — ABNORMAL LOW (ref 135–145)
Total Bilirubin: 0.6 mg/dL (ref 0.3–1.2)
Total Protein: 8 g/dL (ref 6.5–8.1)

## 2021-12-25 LAB — LIPID PANEL
Cholesterol: 193 mg/dL (ref 0–200)
HDL: 48 mg/dL (ref >40)
LDL Cholesterol: 130 mg/dL — ABNORMAL HIGH (ref 0–99)
Total CHOL/HDL Ratio: 4 RATIO
Triglycerides: 76 mg/dL (ref <150)
VLDL: 15 mg/dL (ref 0–40)

## 2021-12-25 LAB — ETHANOL: Alcohol, Ethyl (B): <10 mg/dL (ref <10)

## 2021-12-25 LAB — SALICYLATE LEVEL: Salicylate Lvl: <7 mg/dL — ABNORMAL LOW (ref 7.0–30.0)

## 2021-12-25 LAB — RAPID URINE DRUG SCREEN, HOSP PERFORMED
Amphetamines: NOT DETECTED
Barbiturates: NOT DETECTED
Benzodiazepines: NOT DETECTED
Cocaine: NOT DETECTED
Opiates: NOT DETECTED
Tetrahydrocannabinol: NOT DETECTED

## 2021-12-25 LAB — HEMOGLOBIN A1C
Hgb A1c MFr Bld: 5.4 % (ref 4.8–5.6)
Mean Plasma Glucose: 108.28 mg/dL

## 2021-12-25 LAB — ACETAMINOPHEN LEVEL: Acetaminophen (Tylenol), Serum: <10 ug/mL — ABNORMAL LOW (ref 10–30)

## 2021-12-25 LAB — RESP PANEL BY RT-PCR (FLU A&B, COVID) ARPGX2
Influenza A by PCR: NEGATIVE
Influenza B by PCR: NEGATIVE
SARS Coronavirus 2 by RT PCR: NEGATIVE

## 2021-12-25 LAB — TSH: TSH: 0.636 u[IU]/mL (ref 0.350–4.500)

## 2021-12-25 LAB — HCG, QUANTITATIVE, PREGNANCY: hCG, Beta Chain, Quant, S: <1 m[IU]/mL (ref <5)

## 2021-12-25 MED ORDER — NICOTINE 21 MG/24HR TD PT24
21.0000 mg | MEDICATED_PATCH | Freq: Every day | TRANSDERMAL | Status: DC
  Administered 2021-12-25: 21 mg via TRANSDERMAL
  Filled 2021-12-25: qty 1

## 2021-12-25 MED ORDER — LORAZEPAM 1 MG PO TABS
1.0000 mg | ORAL_TABLET | Freq: Once | ORAL | Status: AC
Start: 2021-12-25 — End: 2021-12-25
  Administered 2021-12-25: 1 mg via ORAL
  Filled 2021-12-25: qty 1

## 2021-12-25 MED ORDER — BENZONATATE 100 MG PO CAPS
100.0000 mg | ORAL_CAPSULE | Freq: Three times a day (TID) | ORAL | Status: DC | PRN
  Administered 2021-12-25: 100 mg via ORAL
  Filled 2021-12-25: qty 1

## 2021-12-25 MED ORDER — ALBUTEROL SULFATE HFA 108 (90 BASE) MCG/ACT IN AERS
2.0000 | INHALATION_SPRAY | Freq: Once | RESPIRATORY_TRACT | Status: AC
  Administered 2021-12-25: 2 via RESPIRATORY_TRACT
  Filled 2021-12-25: qty 6.7

## 2021-12-25 NOTE — ED Provider Notes (Signed)
Emergency Medicine Observation Re-evaluation Note  Candace Howard is a 51 y.o. female, seen on rounds today.  Pt initially presented to the ED for complaints of Suicidal Currently, the patient is calm, cooperative.  Physical Exam  BP 115/88    Pulse 93    Temp 98 F (36.7 C) (Oral)    Resp 17    Ht 5\' 4"  (1.626 m)    Wt 64.9 kg    SpO2 99%    BMI 24.55 kg/m  Physical Exam General: No acute distress Cardiac: Regular rate Lungs: No respiratory distress Psych: Calm  ED Course / MDM  EKG:   I have reviewed the labs performed to date as well as medications administered while in observation.  Recent changes in the last 24 hours include no acute changes. Blood work results still pending. Still awaiting recommendations by psychiatry team Plan  Current plan is for await psychiatry evaluation.  Candace Howard is not under involuntary commitment.     Varney Biles, MD 12/25/21 1031

## 2021-12-25 NOTE — ED Notes (Signed)
Unable to draw enough blood for I-stat beta, ordered hcg quantitative but accidentally clicked off [employer]
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
## 2021-12-25 NOTE — BH Assessment (Addendum)
@  1044, Clinician requested Teressa Senter, RN, to set up the the TTS machine for patient's initial TTS assessment.

## 2021-12-25 NOTE — Progress Notes (Signed)
Pt was accepted to [facility] 12/25/21 at 2130; Bed Assignment 401-1  Pt meets inpatient criteria per Caryn Bee, DNP,  Attending Physician will be MD Bartholomew Crews  Report can be called to: Adult unit: [phone]  Pt can arrive after 2130  Care Team notified via secure chat: [facility] Malva Limes, RN, Granville Lewis, RN, Derwood Kaplan, MD, Edd Arbour, Maxie Barb, Teressa Senter, RN, and Reinaldo Meeker, FNP. Pt must sign the voluntary consent and fax that back to [phone], original to accompany at transport.  Kelton Pillar, LCSWA 12/25/2021 @ 5:50 PM

## 2021-12-25 NOTE — ED Notes (Signed)
Multiple unsuccessful attempts on blood work. Pt wants to hold off on being poked anymore.

## 2021-12-25 NOTE — ED Notes (Signed)
Attempted to call report to [facility], facility requesting report be called after 2300.

## 2021-12-26 ENCOUNTER — Encounter (HOSPITAL_COMMUNITY): Payer: Self-pay | Admitting: Psychiatry

## 2021-12-26 ENCOUNTER — Inpatient Hospital Stay (HOSPITAL_COMMUNITY)
Admission: AD | Admit: 2021-12-26 | Discharge: 2021-12-30 | DRG: 885 | Disposition: A | Discharge status: Home / Self Care | Payer: Medicaid Other | Source: Intra-hospital | Attending: Psychiatry | Admitting: Psychiatry

## 2021-12-26 ENCOUNTER — Other Ambulatory Visit: Payer: Self-pay

## 2021-12-26 DIAGNOSIS — F329 Major depressive disorder, single episode, unspecified: Secondary | ICD-10-CM | POA: Diagnosis present

## 2021-12-26 DIAGNOSIS — Z5986 Financial insecurity: Secondary | ICD-10-CM | POA: Diagnosis not present

## 2021-12-26 DIAGNOSIS — Z9151 Personal history of suicidal behavior: Secondary | ICD-10-CM | POA: Diagnosis not present

## 2021-12-26 DIAGNOSIS — F410 Panic disorder [episodic paroxysmal anxiety] without agoraphobia: Secondary | ICD-10-CM | POA: Diagnosis present

## 2021-12-26 DIAGNOSIS — Z79899 Other long term (current) drug therapy: Secondary | ICD-10-CM

## 2021-12-26 DIAGNOSIS — Z62810 Personal history of physical and sexual abuse in childhood: Secondary | ICD-10-CM | POA: Diagnosis present

## 2021-12-26 DIAGNOSIS — J45909 Unspecified asthma, uncomplicated: Secondary | ICD-10-CM | POA: Diagnosis present

## 2021-12-26 DIAGNOSIS — G629 Polyneuropathy, unspecified: Secondary | ICD-10-CM | POA: Diagnosis present

## 2021-12-26 DIAGNOSIS — F4310 Post-traumatic stress disorder, unspecified: Secondary | ICD-10-CM | POA: Diagnosis present

## 2021-12-26 DIAGNOSIS — R519 Headache, unspecified: Secondary | ICD-10-CM | POA: Diagnosis present

## 2021-12-26 DIAGNOSIS — R32 Unspecified urinary incontinence: Secondary | ICD-10-CM | POA: Diagnosis present

## 2021-12-26 DIAGNOSIS — Z20822 Contact with and (suspected) exposure to covid-19: Secondary | ICD-10-CM | POA: Diagnosis present

## 2021-12-26 DIAGNOSIS — Z5900 Homelessness unspecified: Secondary | ICD-10-CM

## 2021-12-26 DIAGNOSIS — F17210 Nicotine dependence, cigarettes, uncomplicated: Secondary | ICD-10-CM | POA: Diagnosis present

## 2021-12-26 DIAGNOSIS — Z885 Allergy status to narcotic agent status: Secondary | ICD-10-CM | POA: Diagnosis not present

## 2021-12-26 DIAGNOSIS — F333 Major depressive disorder, recurrent, severe with psychotic symptoms: Secondary | ICD-10-CM | POA: Diagnosis present

## 2021-12-26 DIAGNOSIS — G252 Other specified forms of tremor: Secondary | ICD-10-CM | POA: Diagnosis present

## 2021-12-26 DIAGNOSIS — F1411 Cocaine abuse, in remission: Secondary | ICD-10-CM | POA: Diagnosis present

## 2021-12-26 DIAGNOSIS — R45851 Suicidal ideations: Secondary | ICD-10-CM | POA: Diagnosis present

## 2021-12-26 DIAGNOSIS — R251 Tremor, unspecified: Secondary | ICD-10-CM | POA: Diagnosis present

## 2021-12-26 DIAGNOSIS — Z818 Family history of other mental and behavioral disorders: Secondary | ICD-10-CM | POA: Diagnosis not present

## 2021-12-26 DIAGNOSIS — M1990 Unspecified osteoarthritis, unspecified site: Secondary | ICD-10-CM | POA: Diagnosis present

## 2021-12-26 DIAGNOSIS — R9431 Abnormal electrocardiogram [ECG] [EKG]: Secondary | ICD-10-CM | POA: Diagnosis present

## 2021-12-26 DIAGNOSIS — F411 Generalized anxiety disorder: Secondary | ICD-10-CM | POA: Diagnosis present

## 2021-12-26 DIAGNOSIS — Z7951 Long term (current) use of inhaled steroids: Secondary | ICD-10-CM

## 2021-12-26 DIAGNOSIS — E871 Hypo-osmolality and hyponatremia: Secondary | ICD-10-CM | POA: Diagnosis present

## 2021-12-26 DIAGNOSIS — R059 Cough, unspecified: Secondary | ICD-10-CM

## 2021-12-26 MED ORDER — ENSURE ENLIVE PO LIQD
237.0000 mL | Freq: Two times a day (BID) | ORAL | Status: DC
  Administered 2021-12-26 – 2021-12-27 (×2): 237 mL via ORAL
  Filled 2021-12-26 (×7): qty 237

## 2021-12-26 MED ORDER — BENZONATATE 100 MG PO CAPS
100.0000 mg | ORAL_CAPSULE | Freq: Three times a day (TID) | ORAL | Status: DC | PRN
  Administered 2021-12-26 – 2021-12-28 (×4): 100 mg via ORAL
  Filled 2021-12-26 (×4): qty 1

## 2021-12-26 MED ORDER — RISPERIDONE 1 MG PO TABS
1.0000 mg | ORAL_TABLET | Freq: Every day | ORAL | Status: DC
  Administered 2021-12-26 – 2021-12-29 (×4): 1 mg via ORAL
  Filled 2021-12-26 (×7): qty 1

## 2021-12-26 MED ORDER — GABAPENTIN 300 MG PO CAPS
300.0000 mg | ORAL_CAPSULE | Freq: Every day | ORAL | Status: DC
  Administered 2021-12-26: 300 mg via ORAL
  Filled 2021-12-26 (×2): qty 1

## 2021-12-26 MED ORDER — ALBUTEROL SULFATE HFA 108 (90 BASE) MCG/ACT IN AERS
2.0000 | INHALATION_SPRAY | RESPIRATORY_TRACT | Status: DC | PRN
  Administered 2021-12-27 – 2021-12-28 (×3): 2 via RESPIRATORY_TRACT

## 2021-12-26 MED ORDER — MOMETASONE FURO-FORMOTEROL FUM 100-5 MCG/ACT IN AERO
2.0000 | INHALATION_SPRAY | Freq: Two times a day (BID) | RESPIRATORY_TRACT | Status: DC
  Administered 2021-12-26 – 2021-12-30 (×9): 2 via RESPIRATORY_TRACT
  Filled 2021-12-26 (×2): qty 8.8

## 2021-12-26 MED ORDER — ALBUTEROL SULFATE (2.5 MG/3ML) 0.083% IN NEBU
2.5000 mg | INHALATION_SOLUTION | Freq: Four times a day (QID) | RESPIRATORY_TRACT | Status: DC | PRN
  Administered 2021-12-26 – 2021-12-29 (×4): 2.5 mg via RESPIRATORY_TRACT
  Filled 2021-12-26 (×4): qty 3

## 2021-12-26 MED ORDER — NICOTINE 14 MG/24HR TD PT24
14.0000 mg | MEDICATED_PATCH | Freq: Every day | TRANSDERMAL | Status: DC
  Administered 2021-12-26 – 2021-12-30 (×5): 14 mg via TRANSDERMAL
  Filled 2021-12-26 (×6): qty 1

## 2021-12-26 MED ORDER — LOPERAMIDE HCL 2 MG PO CAPS
2.0000 mg | ORAL_CAPSULE | ORAL | Status: DC | PRN
  Administered 2021-12-26: 2 mg via ORAL
  Filled 2021-12-26: qty 1

## 2021-12-26 MED ORDER — BENZONATATE 100 MG PO CAPS
100.0000 mg | ORAL_CAPSULE | Freq: Once | ORAL | Status: AC
  Administered 2021-12-26: 100 mg via ORAL
  Filled 2021-12-26 (×2): qty 1

## 2021-12-26 MED ORDER — HYDROXYZINE HCL 25 MG PO TABS
25.0000 mg | ORAL_TABLET | Freq: Three times a day (TID) | ORAL | Status: DC | PRN
  Administered 2021-12-26 – 2021-12-30 (×9): 25 mg via ORAL
  Filled 2021-12-26 (×9): qty 1

## 2021-12-26 MED ORDER — ALBUTEROL SULFATE HFA 108 (90 BASE) MCG/ACT IN AERS
2.0000 | INHALATION_SPRAY | Freq: Once | RESPIRATORY_TRACT | Status: AC
  Administered 2021-12-26: 2 via RESPIRATORY_TRACT
  Filled 2021-12-26 (×2): qty 6.7
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
## 2021-12-26 NOTE — Progress Notes (Signed)
Pt still coughing. Provider gave verbal order for one-time dose of Albuterol inhaler 2 puffs. Medication administered. Will continue to monitor.
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
## 2021-12-26 NOTE — H&P (Addendum)
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 to assist with discharge planning and identification of hospital follow-up needs prior to discharge Estimated LOS: 5-7 days Discharge Concerns: Need to establish a safety plan; Medication compliance and effectiveness Discharge Goals: Return home with outpatient referrals for mental health follow-up including medication management/psychotherapy  Observation Level/Precautions:  Elopement, suicide, assault  Laboratory: See above  Psychotherapy:  Patient  will be encouraged attend groups  Medications:  See above treatment plan  Consultations:    Discharge Concerns:    Estimated LOS:  Other:     Physician Treatment Plan for Primary Diagnosis: MDD (major depressive disorder), recurrent, severe, with psychosis ([place]) Long Term Goal(s): Improvement in symptoms so as ready for discharge  Short Term Goals: Ability to identify changes in lifestyle to reduce recurrence of condition will improve, Ability to verbalize feelings will improve, Ability to disclose and discuss suicidal ideas, Ability to demonstrate self-control will improve, Ability to identify and develop effective coping behaviors will improve, Ability to maintain clinical measurements within normal limits will improve, Compliance with prescribed medications will improve, and Ability to identify triggers associated with substance abuse/mental health issues will improve  Physician Treatment Plan for Secondary Diagnosis: Principal Problem:   MDD (major depressive disorder), recurrent, severe, with psychosis ([place])  Long Term Goal(s): Improvement in symptoms so as ready for discharge  Short Term  Goals: Ability to identify changes in lifestyle to reduce recurrence of condition will improve, Ability to verbalize feelings will improve, Ability to disclose and discuss suicidal ideas, Ability to demonstrate self-control will improve, Ability to identify and develop effective coping behaviors will improve, Ability to maintain clinical measurements within normal limits will improve, Compliance with prescribed medications will improve, and Ability to identify triggers associated with substance abuse/mental health issues will improve  I certify that inpatient services furnished can reasonably be expected to improve the patient's condition.    [name], MD 1/7/20233:27 PM

## 2021-12-26 NOTE — Progress Notes (Signed)
Patient denies SI, HI and AVH this shift. Patient has been complaining of coughing, diarrhea and stress incontinence. Patient has participated in groups and has been compliant for medications.   Offer patient medication as prescribed, engage patient in 1:1 staff talks, assess patient for safety.   Continue to monitor as planned. Patient able to contract for safety.

## 2021-12-26 NOTE — Progress Notes (Signed)
Pt could be heard coughing down the hallway. Pt having bouts of stress urinary incontinence also with the force of the coughing. Provider notified and given one-time dose of Tessalon 100 mg. This was previously given in the ED to pt along with an inhaler.  Pt given meds and also mesh panties and incontinence pads. Pt had urinary accident at med window when she began coughing again.

## 2021-12-26 NOTE — Progress Notes (Signed)
Pt admitted voluntarily to [facility] inpatient adult unit due to experiencing suicidal ideation.  Pt reports becoming homeless a few days ago.  Pt stated she went to a AA meeting and was told she should get help.  She agreed and went to [facility] for help.  Pt feels she is on an emotional war path.  She feels something is messing with her but is unable to identify what that is.  She states it is at a point it is about to drive her crazy.  Pt reports she has been unemployed since November.  Pt denies current drug and alcohol use.  Pt states she has not been compliant with medications for several months. She states she has a problem taking her medicines on her own.  Unable to verbalize way.  Pt denied SI, HI.  Pt endorses AVH.  Pt sees a young lady's head and is unable to remember her auditory hallucinations.  Fifteen minute checks initiated for patient safety.  Pt safe on unit.

## 2021-12-26 NOTE — [facility] Suicide Risk Assessment (Signed)
[facility] INPATIENT:  Family/Significant Other Suicide Prevention Education  Suicide Prevention Education:  Patient Refusal for Family/Significant Other Suicide Prevention Education: The patient Candace Howard has refused to provide written consent for family/significant other to be provided Family/Significant Other Suicide Prevention Education during admission and/or prior to discharge.  Physician notified.  SPE completed with pt, as pt refused to consent to family contact. SPI pamphlet provided to pt and pt was encouraged to share information with support network, ask questions, and talk about any concerns relating to SPE. Pt denies access to guns/firearms and verbalized understanding of information provided. Mobile Crisis information also provided to pt.    Rona Ravens LCSW 12/26/2021, 2:23 PM

## 2021-12-26 NOTE — Progress Notes (Signed)
Patient has been in her room the majority of the night. She has been coughing a lot and received a neb treatment along with tessalon pearls to help her have a productive cough. She did sit in the dayroom briefly and ate snack and watched some tv. She was given a pitcher of water and encouraged to drink plenty of fluids. Support given and safety maintained on unit with 15 min checks.

## 2021-12-26 NOTE — Progress Notes (Signed)
Adult Psychoeducational Group Note  Date:  12/26/2021 Time:  8:26 PM  Group Topic/Focus:  Wrap-Up Group:   The focus of this group is to help patients review their daily goal of treatment and discuss progress on daily workbooks.  Participation Level:  Did Not Attend  Participation Quality:   Did Not Attend  Affect:   Did Not Attend  Cognitive:   Did Not Attend  Insight: None  Engagement in Group:   Did Not Attend  Modes of Intervention:   Did Not Attend  Additional Comments:  Pt did not attend evening wrap up group tonight.  Felipa Furnace 12/26/2021, 8:26 PM

## 2021-12-26 NOTE — Group Note (Unsigned)
Date:  12/26/2021 °Time:  9:45 AM ° °Group Topic/Focus:  °Orientation:   The focus of this group is to educate the patient on the purpose and policies of crisis stabilization and provide a format to answer questions about their admission.  The group details unit policies and expectations of patients while admitted. ° ° ° ° °Participation Level:  {[facility] PARTICIPATION LEVEL:22264} ° °Participation Quality:  {[facility] PARTICIPATION QUALITY:22265} ° °Affect:  {B[facility] AFFECT:22266} ° °Cognitive:  {[facility] COGNITIVE:22267} ° °Insight: {BHH Insight2:20797} ° °Engagement in Group:  {B[facility] ENGAGEMENT IN GROUP:22268} ° °Modes of Intervention:  {[facility] MODES OF INTERVENTION:22269} ° °Additional Comments:  *** ° °Candace Howard °12/26/2021, 9:45 AM ° °

## 2021-12-26 NOTE — [facility] Group Notes (Signed)
Goals Group 12/26/21    Group Focus: affirmation, clarity of thought, and goals/reality orientation Treatment Modality:  Psychoeducation Interventions utilized were assignment, group exercise, and support Purpose: To be able to understand and verbalize the reason for their admission to the hospital. To understand that the medication helps with their chemical imbalance but they also need to work on their choices in life. To be challenged to develop a list of 30 positives about themselves. Also introduce the concept that "feelings" are not reality.  Participation Level:  Active  Participation Quality:  Appropriate  Affect:  Appropriate  Cognitive:  Appropriate  Insight:  Improving  Engagement in Group:  Engaged  Additional Comments:  Rates energy at a 2/10  Candace Howard A
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
## 2021-12-26 NOTE — Tx Team (Addendum)
Initial Treatment Plan 12/26/2021 2:49 AM Candace Howard [id number]    PATIENT STRESSORS: Medication non-compliance homeless   PATIENT STRENGTHS: Ability for insight  Average or above average intelligence  Communication skills  General fund of knowledge  Motivation for treatment/growth    PATIENT IDENTIFIED PROBLEMS: depression  Suicidal ideation  Medication non-compliace                 DISCHARGE CRITERIA:  Ability to meet basic life and health needs Improved stabilization in mood, thinking, and/or behavior Motivation to continue treatment in a less acute level of care Need for constant or close observation no longer present Reduction of life-threatening or endangering symptoms to within safe limits Verbal commitment to aftercare and medication compliance  PRELIMINARY DISCHARGE PLAN: Outpatient therapy Return to previous living arrangement  PATIENT/FAMILY INVOLVEMENT: This treatment plan has been presented to and reviewed with the patient, Candace Howard, and/or family member.  The patient and family have been given the opportunity to ask questions and make suggestions.  Hoover Browns, RN 12/26/2021, 2:49 AM

## 2021-12-26 NOTE — [facility] Group Notes (Addendum)
Psychoeducational Group Note    Date:12/26/21 Time: 1300-1400    Purpose of Group: . The group focus' on teaching patients on how to identify their needs and their Life Skills:  A group where two lists are made. What people need and what are things that we do that are unhealthy. The lists are developed by the patients and it is explained that we often do the actions that are not healthy to get our list of needs met.  Goal:: to develop the coping skills needed to get their needs met  Participation Level:  Active  Participation Quality:  Appropriate  Affect:  Appropriate  Cognitive:  Oriented  Insight:  Improving  Engagement in Group:  Engaged  Additional Comments:  Rates her energy at a 3/10. Participated in the group.  Paulino Rily

## 2021-12-27 ENCOUNTER — Inpatient Hospital Stay (HOSPITAL_COMMUNITY): Payer: Medicaid Other

## 2021-12-27 LAB — BASIC METABOLIC PANEL
Anion gap: 9 (ref 5–15)
BUN: 11 mg/dL (ref 6–20)
CO2: 26 mmol/L (ref 22–32)
Calcium: 8.3 mg/dL — ABNORMAL LOW (ref 8.9–10.3)
Chloride: 103 mmol/L (ref 98–111)
Creatinine, Ser: 0.7 mg/dL (ref 0.44–1.00)
GFR, Estimated: >60 mL/min (ref >60)
Glucose, Bld: 140 mg/dL — ABNORMAL HIGH (ref 70–99)
Potassium: 4 mmol/L (ref 3.5–5.1)
Sodium: 138 mmol/L (ref 135–145)

## 2021-12-27 LAB — LIPID PANEL
Cholesterol: 182 mg/dL (ref 0–200)
HDL: 49 mg/dL (ref >40)
LDL Cholesterol: 110 mg/dL — ABNORMAL HIGH (ref 0–99)
Total CHOL/HDL Ratio: 3.7 RATIO
Triglycerides: 114 mg/dL (ref <150)
VLDL: 23 mg/dL (ref 0–40)

## 2021-12-27 MED ORDER — GABAPENTIN 300 MG PO CAPS
300.0000 mg | ORAL_CAPSULE | Freq: Three times a day (TID) | ORAL | Status: DC
  Administered 2021-12-27 – 2021-12-30 (×10): 300 mg via ORAL
  Filled 2021-12-27 (×12): qty 1

## 2021-12-27 MED ORDER — ESCITALOPRAM OXALATE 10 MG PO TABS
10.0000 mg | ORAL_TABLET | Freq: Every day | ORAL | Status: DC
  Administered 2021-12-27 – 2021-12-30 (×4): 10 mg via ORAL
  Filled 2021-12-27 (×5): qty 1

## 2021-12-27 MED ORDER — BOOST / RESOURCE BREEZE PO LIQD CUSTOM
1.0000 | Freq: Two times a day (BID) | ORAL | Status: DC
  Administered 2021-12-27 – 2021-12-30 (×6): 1 via ORAL
  Filled 2021-12-27 (×8): qty 1

## 2021-12-27 NOTE — Progress Notes (Addendum)
[facility], [place] MD Progress Note  12/27/2021 2:22 PM Candace Howard  MRN:  [id number]  Subjective:  Candace Howard is a 51 year old female with psychiatric history of depression and self reported history of bipolar disorder, PTSD, and anxiety and medical history of asthma and neuropathy who initially presented to [name] ED for complaints of suicidal ideation without a plan.  Chart review from last 24 hours-The patient's chart was reviewed and nursing notes were reviewed. Patient discussed in progression rounds with treatment team. MAR was reviewed and Pt is complaint with scheduled medications and required PRN medications Tessalon x2, Atarax x2, albuterol nebulizer x1 yesterday.  Evaluation on the unit today - Pt is seen and examined today. Pt states her mood is "ok".  She states she had her ups and downs but group therapy helped her. Pt slept well last night. Pt states her appetite is stable. Currently, Pt denies any suicidal ideation, homicidal ideation and, visual and auditory hallucinations. She endorses paranoia.  She states that she is worried about her cat, car, and lot of other things.  She states that she is not able to relax because of all these worries.  Discussed increasing gabapentin to 3 times daily to help with residual anxiety.  She verbalizes understanding.  She denies any first rank symptoms and ideas of reference.  She states she has tried Wellbutrin in the past.  Discussed that Wellbutrin can increase anxiety in already anxious patients.  After discussing risks and benefits, patient agrees to starting Lexapro to help with depression and anxiety.     Patient reports some foot pain due to bad callus.  Examination of the foot does not show any redness, discharge or ulcer.  She states her cough has improved but her breathing is shallow.  Discussed that we ordered chest X-ray which would be done today.  She agrees with the plan.  Pt denies any headache, nausea, vomiting, dizziness, chest pain, SOB,  abdominal pain, diarrhea, and constipation. Pt denies any medication side effects and has been tolerating it well. Pt denies any concerns.  She states she is thinking about going to [facility] but not sure if they would accept her.  Discussed that some facilities will not accept patients with pending legal charges. She denies high risk sexual behaviors prior to admission but given past substance abuse history was willing for STD testing while inpatient.    Principal Problem: MDD (major depressive disorder), recurrent, severe, with psychosis ([place]) Diagnosis: Principal Problem:   MDD (major depressive disorder), recurrent, severe, with psychosis ([place])  Total Time Spent in Direct Patient Care:  I personally spent 30 minutes on the unit in direct patient care. The direct patient care time included face-to-face time with the patient, reviewing the patient's chart, communicating with other professionals, and coordinating care. Greater than 50% of this time was spent in counseling or coordinating care with the patient regarding goals of hospitalization, psycho-education, and discharge planning needs.  Past Psychiatric History: see H&P  Past Medical History:  Past Medical History:  Diagnosis Date   Anemia    Chest pain    Depression    Difficulty breathing    Frequent headaches    IBS (irritable bowel syndrome)    Palpitations    Poor circulation    History reviewed. No pertinent surgical history. Family History:  Family History  Problem Relation Age of Onset   Heart disease Father    Hypertension Father    Diabetes Father    Diabetes Paternal Grandmother  COPD Mother    Glaucoma Mother    Family Psychiatric  History: see H&P  Social History:  Social History   Substance and Sexual Activity  Alcohol Use No   Alcohol/week: 0.0 standard drinks     Social History   Substance and Sexual Activity  Drug Use No    Social History   Socioeconomic History   Marital status: Single     Spouse name: Not on file   Number of children: Not on file   Years of education: Not on file   Highest education level: Not on file  Occupational History   Not on file  Tobacco Use   Smoking status: Every Day    Packs/day: 1.00    Years: 35.00    Pack years: 35.00    Types: Cigarettes   Smokeless tobacco: Never  Vaping Use   Vaping Use: Never used  Substance and Sexual Activity   Alcohol use: No    Alcohol/week: 0.0 standard drinks   Drug use: No   Sexual activity: Not Currently  Other Topics Concern   Not on file  Social History Narrative   Not on file   Social Determinants of Health   Financial Resource Strain: Not on file  Food Insecurity: Not on file  Transportation Needs: Not on file  Physical Activity: Not on file  Stress: Not on file  Social Connections: Not on file    Sleep: Good  Appetite:  Good  Current Medications: Current Facility-Administered Medications  Medication Dose Route Frequency Provider Last Rate Last Admin   albuterol (PROVENTIL) (2.5 MG/3ML) 0.083% nebulizer solution 2.5 mg  2.5 mg Nebulization Q6H PRN Nelda Marseille, Alysen Smylie E, MD   2.5 mg at 12/26/21 2143   albuterol (VENTOLIN HFA) 108 (90 Base) MCG/ACT inhaler 2 puff  2 puff Inhalation Q4H PRN Harlow Asa, MD   2 puff at 12/27/21 0811   benzonatate (TESSALON) capsule 100 mg  100 mg Oral TID PRN Harlow Asa, MD   100 mg at 12/27/21 0813   escitalopram (LEXAPRO) tablet 10 mg  10 mg Oral Daily [name], MD   10 mg at 12/27/21 1305   feeding supplement (BOOST / RESOURCE BREEZE) liquid 1 Container  1 Container Oral BID BM Jnai Snellgrove E, MD       gabapentin (NEURONTIN) capsule 300 mg  300 mg Oral TID [name], MD   300 mg at 12/27/21 1305   hydrOXYzine (ATARAX) tablet 25 mg  25 mg Oral TID PRN [name], MD   25 mg at 12/27/21 0810   loperamide (IMODIUM) capsule 2 mg  2 mg Oral PRN [name], MD   2 mg at 12/26/21 1659   mometasone-formoterol (DULERA) 100-5 MCG/ACT  inhaler 2 puff  2 puff Inhalation BID [name], MD   2 puff at 12/27/21 0812   nicotine (NICODERM CQ - dosed in mg/24 hours) patch 14 mg  14 mg Transdermal Daily [name], [name], NP   14 mg at 12/27/21 [id number]   risperiDONE (RISPERDAL) tablet 1 mg  1 mg Oral QHS [name], MD   1 mg at 12/26/21 2109    Lab Results:  Results for orders placed or performed during the hospital encounter of 12/26/21 (from the past 48 hour(s))  Basic metabolic panel     Status: Abnormal   Collection Time: 12/27/21  6:40 AM  Result Value Ref Range   Sodium 138 135 - 145 mmol/L   Potassium 4.0 3.5 - 5.1 mmol/L  Chloride 103 98 - 111 mmol/L   CO2 26 22 - 32 mmol/L   Glucose, Bld 140 (H) 70 - 99 mg/dL    Comment: Glucose reference range applies only to samples taken after fasting for at least 8 hours.   BUN 11 6 - 20 mg/dL   Creatinine, Ser 0.70 0.44 - 1.00 mg/dL   Calcium 8.3 (L) 8.9 - 10.3 mg/dL   GFR, Estimated >60 >60 mL/min    Comment: (NOTE) Calculated using the CKD-EPI Creatinine Equation (2021)    Anion gap 9 5 - 15    Comment: Performed at [facility], [place] [address], [place], [place] [address]  Lipid panel     Status: Abnormal   Collection Time: 12/27/21  6:40 AM  Result Value Ref Range   Cholesterol 182 0 - 200 mg/dL   Triglycerides 114 <150 mg/dL   HDL 49 >40 mg/dL   Total CHOL/HDL Ratio 3.7 RATIO   VLDL 23 0 - 40 mg/dL   LDL Cholesterol 110 (H) 0 - 99 mg/dL    Comment:        Total Cholesterol/HDL:CHD Risk Coronary Heart Disease Risk Table                     Men   Women  1/2 Average Risk   3.4   3.3  Average Risk       5.0   4.4  2 X Average Risk   9.6   7.1  3 X Average Risk  23.4   11.0        Use the calculated Patient Ratio above and the CHD Risk Table to determine the patient's CHD Risk.        ATP III CLASSIFICATION (LDL):  <100     mg/dL   Optimal  100-129  mg/dL   Near or Above                    Optimal  130-159  mg/dL   Borderline  160-189   mg/dL   High  >190     mg/dL   Very High Performed at [facility] [address], [place], [place] [address]     Blood Alcohol level:  Lab Results  Component Value Date   ETH <10 [id number]    Metabolic Disorder Labs: Lab Results  Component Value Date   HGBA1C 5.4 12/25/2021   MPG 108.28 12/25/2021   No results found for: PROLACTIN Lab Results  Component Value Date   CHOL 182 12/27/2021   TRIG 114 12/27/2021   HDL 49 12/27/2021   CHOLHDL 3.7 12/27/2021   VLDL 23 12/27/2021   LDLCALC 110 (H) 12/27/2021   LDLCALC 130 (H) 12/25/2021    Physical Findings:  Musculoskeletal: Strength & Muscle Tone: within normal limits Gait & Station: normal Patient leans: N/A  Psychiatric Specialty Exam:  Presentation  General Appearance: Disheveled  Eye Contact:Good  Speech:Clear and Coherent  Speech Volume:Normal   Mood and Affect  Mood:Anxious; Dysphoric  Affect:Depressed   Thought Process  Thought Processes:circumstantial and at times tangential  Descriptions of Associations:Intact  Orientation:Full (Time, Place and Person)  Thought Content:Rumination; Paranoid Ideation - denies AVH, ideas of reference or first rank symptoms, denies SI or HI  Hallucinations:Hallucinations: None  Ideas of Reference:Paranoia  Suicidal Thoughts:Suicidal Thoughts: No  Homicidal Thoughts:Homicidal Thoughts: No   Sensorium  Memory:Recent Fair; Remote Fair; Immediate Poor  Judgment:Poor  Insight:shallow   Executive Functions  Concentration:Fair  Attention Span:Fair  [place]  Language:Good   Psychomotor Activity  Psychomotor Activity:Psychomotor Activity: Normal   Assets  Assets:Desire for Improvement; Communication Skills   Sleep  Total time unrecorded  Physical Exam: Physical Exam Vitals reviewed.  HENT:     Head: Normocephalic.  Neurological:     General: No focal deficit present.     Mental  Status: She is alert.   Review of Systems  Constitutional:  Negative for fever.  Respiratory:  Positive for cough. Negative for shortness of breath.   Cardiovascular:  Negative for chest pain.  Gastrointestinal:  Negative for abdominal pain, nausea and vomiting.  Neurological:  Negative for dizziness and headaches.  Psychiatric/Behavioral:  Positive for depression and hallucinations. Negative for suicidal ideas. The patient is nervous/anxious. The patient does not have insomnia.   Blood pressure 98/72, pulse 77, temperature 98.5 F (36.9 C), temperature source Oral, resp. rate 18, height 5\' 3"  (1.6 m), weight 63 kg, last menstrual period 10/20/2021, SpO2 99 %. Body mass index is 24.59 kg/m.   Treatment Plan Summary:   Labs reviewed  New labs.  BMP within normal limits except glucose 140, calcium 8.3 Lipid panel normal limits except  LDL 110  Safety and Monitoring -- Observation Level : q15 minute checks   MDD, recurrent, severe with psychotic features (r/o schizoaffective d/o, r/o bipolar depressed severe with psychotic features) -Continue risperidone 1 mg nightly.  We will titrate if needed during this hospitalization. (R/b/se/a to med reviewed and she consents to med trial)  - Lipid panel WNL except for LDL 110, A1c 5.4, QTC 452ms -Start Lexapro 10 mg daily.(R/b/a/se discussed and patient agrees with medication trial). - Attempt to obtain collateral from records or family with her consent  Anxiety Neuropathy Increase gabapentin to 300 mg 3 times daily for anxiety and neuropathy.  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   Mild Hyponatremia  - resolved - Recheck BMP shows Na+ 138 on 12/27/21  Diarrhea -Continue Imodium 4 mg   Chronic urinary incontinence -urinalysis to rule out UTI-pending.   Abnormal EKG (r/o inferior and anterior infarct age indeterminate) - Repeat EKG pending - patient currently asymptomatic  Screening for STD 's -Order hepatitis panel, RPR, HIV, GC/chlamydia.   PRN's  -Continue Tylenol 650 mg every 6 hours as needed for pain or fever -Continue Hydroxyzine 25 mg TID PRN for Anxiety.  [name], MD PGy2 12/27/2021, 2:22 PM

## 2021-12-27 NOTE — Progress Notes (Signed)
°   12/27/21 0745  Psych Admission Type (Psych Patients Only)  Admission Status Voluntary  Psychosocial Assessment  Patient Complaints Anxiety  Eye Contact Fair  Facial Expression Anxious  Affect Depressed  Speech Logical/coherent  Interaction Assertive  Motor Activity Slow  Appearance/Hygiene Disheveled;In hospital gown  Behavior Characteristics Agitated;Anxious  Mood Anxious  Thought Process  Coherency WDL  Content WDL  Delusions None reported or observed  Perception WDL  Hallucination None reported or observed  Judgment WDL  Confusion None  Danger to Self  Current suicidal ideation? Denies  Danger to Others  Danger to Others None reported or observed

## 2021-12-27 NOTE — Progress Notes (Signed)
Adult Psychoeducational Group Note  Date:  12/27/2021 Time:  10:48 PM  Group Topic/Focus:  Wrap-Up Group:   The focus of this group is to help patients review their daily goal of treatment and discuss progress on daily workbooks.  Participation Level:  Active  Participation Quality:  Appropriate  Affect:  Appropriate  Cognitive:  Appropriate  Insight: Appropriate  Engagement in Group:  Engaged  Modes of Intervention:  Discussion  Additional Comments:  Pt stated her goal for today was to focus on her treatment plan. Pt stated she accomplished her goal today. Pt stated she talked with her doctor but did not get a chance to speak with her social worker about her care today. Pt rated her overall day a 7 out of 10. Pt stated she was able to contact her son today which improved her overall day. Pt stated she felt better about herself tonight. Pt stated she was able to attend all groups held today. Pt stated she was able to attend all meals. Pt stated she took all medications provided today. Pt stated her appetite was pretty good today. Pt rated her sleep last night was poor. Pt stated the goal tonight was to get some rest. Pt stated she had some physical pain tonight. Pt stated she had some mild pain in her left foot. Pt rated the mild pain in  her left foot a 3 on the pain level scale. Pt nurse was updated on the situation. Pt deny visual hallucinations and auditory issues tonight.  Pt denies thoughts of harming herself or others. Pt stated she would alert staff if anything changed.  Candy Sledge 12/27/2021, 10:48 PM

## 2021-12-27 NOTE — Group Note (Signed)
°  [facility] LCSW Group Therapy Note  Date/Time:  12/27/2021 10:00AM-11:00AM  Type of Therapy and Topic:  Group Therapy:  Self-Care after Hospitalization  Participation Level:  Active   Description of Group This process group involved patients discussing how they plan to take care of themselves in a better manner when they get home from the hospital.  The group started with patients listing one healthy and one unhealthy way they took care of themselves prior to hospitalization.  A discussion ensued about the differences in healthy and unhealthy coping skills.  Group members shared ideas about making changes when they return home so that they can stay well and in recovery.  The white board was used to list ideas so that patients can continue to see these ideas throughout the day.  Therapeutic Goals Patient will identify and describe one healthy and one unhealthy coping technique used prior to hospitalization Patient will participate in generating ideas about healthy self-care options when they return to the community Patients will be supportive of one another and receive said support from others Patient will identify one healthy self-care activity to add to his/her post-hospitalization life that can help in recovery  Summary of Patient Progress:  The patient expressed that prior to hospitalization some healthy self-care activity she engaged in was no longer using drugs, while unhealthy self-care activity included sinking in negative thoughts allowing them to dictate her life.  Patient's participation in group was beneficial.   Patient identified positive self talk as another self-care activity to add in her pursuit of recovery post-discharge.   Therapeutic Modalities Brief Solution-Focused Therapy Motivational Interviewing Psychoeducation      Veva Holes, Theresia Majors 12/27/2021  1:35 PM

## 2021-12-27 NOTE — [facility] Group Notes (Signed)
Adult Psychoeducational Group Not Date:  12/27/2021 Time:  0900-1045 Group Topic/Focus: PROGRESSIVE RELAXATION. A group where deep breathing is taught and tensing and relaxation muscle groups is used. Imagery is used as well.  Pts are asked to imagine 3 pillars that hold them up when they are not able to hold themselves up.  Participation Level:  Active  Participation Quality:  Appropriate  Affect:  Appropriate  Cognitive:  Oriented  Insight: Improving  Engagement in Group:  Engaged  Modes of Intervention:  Activity, Discussion, Education, and Support  Additional Comments:  Pt rates her energy at a 4/10. States what holds her up is [employer], her son, God and her firends  Paulino Rily

## 2021-12-27 NOTE — Progress Notes (Signed)
°   12/27/21 2112  Psych Admission Type (Psych Patients Only)  Admission Status Voluntary  Psychosocial Assessment  Patient Complaints Anxiety  Eye Contact Fair  Facial Expression Anxious;Animated  Affect Depressed  Speech Logical/coherent  Interaction Assertive  Motor Activity Slow  Appearance/Hygiene Disheveled;In hospital gown  Behavior Characteristics Cooperative;Appropriate to situation  Mood Anxious  Thought Process  Coherency WDL  Content WDL  Delusions None reported or observed  Perception WDL  Hallucination None reported or observed  Judgment Impaired  Confusion None  Danger to Self  Current suicidal ideation? Denies  Danger to Others  Danger to Others None reported or observed

## 2021-12-27 NOTE — [facility] Group Notes (Signed)
Psychoeducational Group Note  Date:  12/27/2021 Time:  1300-1400   Group Topic/Focus: This is a continuation of the group from Saturday. Pt's have been asked to formulate a list of 30 positives about themselves. This list is to be read 2 times a day for 30 days, looking in a mirror. Changing patterns of negative self talk. Also discussed is the fact that there have been some people who hurt [place] in the past. We keep that memory alive within [place]. Ways to cope with this are discused   Participation Level:  Active  Participation Quality:  Appropriate  Affect:  Appropriate  Cognitive:  Oriented  Insight: Improving  Engagement in Group:  Engaged  Modes of Intervention:  Activity, Discussion, Education, and Support  Additional Comments:  Pt rates her energy at a 5/10. Participated in the groups fully   Dione Housekeeper

## 2021-12-28 ENCOUNTER — Encounter (HOSPITAL_COMMUNITY): Payer: Self-pay

## 2021-12-28 LAB — HEPATITIS PANEL, ACUTE
HCV Ab: REACTIVE — AB
Hep A IgM: NONREACTIVE
Hep B C IgM: NONREACTIVE
Hepatitis B Surface Ag: NONREACTIVE

## 2021-12-28 LAB — HIV ANTIBODY (ROUTINE TESTING W REFLEX): HIV Screen 4th Generation wRfx: NONREACTIVE

## 2021-12-28 LAB — RPR: RPR Ser Ql: NONREACTIVE

## 2021-12-28 MED ORDER — HYDROXYZINE HCL 25 MG PO TABS
25.0000 mg | ORAL_TABLET | Freq: Once | ORAL | Status: AC
  Administered 2021-12-28: 25 mg via ORAL
  Filled 2021-12-28 (×2): qty 1

## 2021-12-28 NOTE — Group Note (Signed)
Recreation Therapy Group Note   Group Topic:Stress Management  Group Date: 12/28/2021 Start Time: 0930 End Time: 1000 Facilitators: Victorino Sparrow, LRT,CTRS Location: [address] Dayroom   Goal Area(s) Addresses:  Patient will actively participate in stress management techniques presented during session.  Patient will successfully identify benefit of practicing stress management post d/c.   Group Description: Guided Imagery. LRT provided education, instruction, and demonstration on practice of visualization via guided imagery. Patient was asked to participate in the technique introduced during session. LRT debriefed including topics of mindfulness, stress management and specific scenarios each patient could use these techniques. Patients were given suggestions of ways to access scripts post d/c and encouraged to explore Youtube and other apps available on smartphones, tablets, and computers.   Affect/Mood: Appropriate   Participation Level: Active   Participation Quality: Independent   Behavior: Appropriate   Speech/Thought Process: Focused   Insight: Good   Judgement: Good   Modes of Intervention: Script   Patient Response to Interventions:  Engaged   Education Outcome:  Acknowledges education and In group clarification offered    Clinical Observations/Individualized Feedback: Pt attended and participated in group despite numerous interruptions by staff.    Plan: Continue to engage patient in RT group sessions 2-3x/week.   Victorino Sparrow, LRT,CTRS 12/28/2021 12:28 PM

## 2021-12-28 NOTE — Progress Notes (Signed)
NUTRITION ASSESSMENT RD working remotely.   Pt identified as at risk on the Malnutrition Screen Tool  INTERVENTION: - continue Boost Breeze BID, each supplement provides 250 kcal and 9 grams of protein.  NUTRITION DIAGNOSIS: Unintentional weight loss related to sub-optimal intake as evidenced by pt report.   Goal: Pt to meet >/= 90% of their estimated nutrition needs.  Monitor:  PO intake  Assessment:  Patient with psych hx of depression, bipolar disorder, PTSD, and anxiety. She was admitted for MDD and reported SI.   Patient reported to ED staff that she recently became homeless. She reported poor appetite.   Weight on 1/7 was 139 lb and PTA the most recently documented weight was 150 lb on 12/05/17.  Boost Breeze was ordered BID on 1/8 and she has accepted this supplement 100% of the time offered.    51 y.o. female  Height: Ht Readings from Last 1 Encounters:  12/26/21 5\' 3"  (1.6 m)    Weight: Wt Readings from Last 1 Encounters:  12/26/21 63 kg    Weight Hx: Wt Readings from Last 10 Encounters:  12/26/21 63 kg  12/24/21 64.9 kg  12/05/17 68.2 kg  10/24/17 71.8 kg    BMI:  Body mass index is 24.59 kg/m. Pt meets criteria for normal weight/borderline overweight based on current BMI.  Estimated Nutritional Needs: Kcal: 25-30 kcal/kg Protein: > 1 gram protein/kg Fluid: 1 ml/kcal  Diet Order:  Diet Order             Diet regular Room service appropriate? Yes; Fluid consistency: Thin  Diet effective now                  Pt is also offered choice of unit snacks mid-morning and mid-afternoon.  Pt is eating as desired.   Lab results and medications reviewed.     13/05/18, MS, RD, LDN Inpatient Clinical Dietitian RD pager # available in [facility]  After hours/weekend pager # available in [facility]

## 2021-12-28 NOTE — Plan of Care (Signed)
°  Problem: Activity: Goal: Interest or engagement in leisure activities will improve Outcome: Progressing Goal: Imbalance in normal sleep/wake cycle will improve Outcome: Progressing   Problem: Coping: Goal: Coping ability will improve Outcome: Progressing   

## 2021-12-28 NOTE — BH IP Treatment Plan (Signed)
Interdisciplinary Treatment and Diagnostic Plan Update  12/28/2021 Time of Session: 10:45am Candace Howard MRN: [id number]  Principal Diagnosis: MDD (major depressive disorder), recurrent, severe, with psychosis ([place])  Secondary Diagnoses: Principal Problem:   MDD (major depressive disorder), recurrent, severe, with psychosis ([name])   Current Medications:  Current Facility-Administered Medications  Medication Dose Route Frequency Provider Last Rate Last Admin   albuterol (PROVENTIL) (2.5 MG/3ML) 0.083% nebulizer solution 2.5 mg  2.5 mg Nebulization Q6H PRN Nelda Marseille, Amy E, MD   2.5 mg at 12/27/21 2226   albuterol (VENTOLIN HFA) 108 (90 Base) MCG/ACT inhaler 2 puff  2 puff Inhalation Q4H PRN Harlow Asa, MD   2 puff at 12/28/21 0731   benzonatate (TESSALON) capsule 100 mg  100 mg Oral TID PRN Harlow Asa, MD   100 mg at 12/27/21 0813   escitalopram (LEXAPRO) tablet 10 mg  10 mg Oral Daily [name], MD   10 mg at 12/28/21 0731   feeding supplement (BOOST / RESOURCE BREEZE) liquid 1 Container  1 Container Oral BID BM Harlow Asa, MD   1 Container at 12/28/21 1013   gabapentin (NEURONTIN) capsule 300 mg  300 mg Oral TID [name], MD   300 mg at 12/28/21 0731   hydrOXYzine (ATARAX) tablet 25 mg  25 mg Oral TID PRN [name], MD   25 mg at 12/27/21 2112   loperamide (IMODIUM) capsule 2 mg  2 mg Oral PRN [name], MD   2 mg at 12/26/21 1659   mometasone-formoterol (DULERA) 100-5 MCG/ACT inhaler 2 puff  2 puff Inhalation BID [name], MD   2 puff at 12/28/21 0732   nicotine (NICODERM CQ - dosed in mg/24 hours) patch 14 mg  14 mg Transdermal Daily [name], [name], NP   14 mg at 12/28/21 0731   risperiDONE (RISPERDAL) tablet 1 mg  1 mg Oral QHS [name], MD   1 mg at 12/27/21 2112   PTA Medications: Medications Prior to Admission  Medication Sig Dispense Refill Last Dose   albuterol (VENTOLIN HFA) 108 (90 Base) MCG/ACT inhaler Inhale 2 puffs into the lungs  every 6 (six) hours as needed for wheezing or shortness of breath. (Patient not taking: Reported on 12/25/2021)      amoxicillin-clavulanate (AUGMENTIN) 875-125 MG tablet Take 1 tablet by mouth 2 (two) times daily. 28 tablet 0    budesonide-formoterol (SYMBICORT) 80-4.5 MCG/ACT inhaler 2 puffs daily.      gabapentin (NEURONTIN) 300 MG capsule Take 300 mg by mouth 3 (three) times daily.      lurasidone (LATUDA) 40 MG TABS tablet Take 40 mg by mouth daily with breakfast.      promethazine (PHENERGAN) 25 MG tablet Take 1 tablet (25 mg total) by mouth every 8 (eight) hours as needed for nausea or vomiting. 20 tablet 0     Patient Stressors:    Patient Strengths: Ability for insight  Average or above average intelligence  Communication skills  General fund of knowledge  Motivation for treatment/growth   Treatment Modalities: Medication Management, Group therapy, Case management,  1 to 1 session with clinician, Psychoeducation, Recreational therapy.   Physician Treatment Plan for Primary Diagnosis: MDD (major depressive disorder), recurrent, severe, with psychosis ([place]) Long Term Goal(s): Improvement in symptoms so as ready for discharge   Short Term Goals: Ability to identify changes in lifestyle to reduce recurrence of condition will improve Ability to verbalize feelings will improve Ability to disclose and discuss suicidal ideas Ability to  demonstrate self-control will improve Ability to identify and develop effective coping behaviors will improve Ability to maintain clinical measurements within normal limits will improve Compliance with prescribed medications will improve Ability to identify triggers associated with substance abuse/mental health issues will improve  Medication Management: Evaluate patient's response, side effects, and tolerance of medication regimen.  Therapeutic Interventions: 1 to 1 sessions, Unit Group sessions and Medication administration.  Evaluation of  Outcomes: Not Met  Physician Treatment Plan for Secondary Diagnosis: Principal Problem:   MDD (major depressive disorder), recurrent, severe, with psychosis ([name])  Long Term Goal(s): Improvement in symptoms so as ready for discharge   Short Term Goals: Ability to identify changes in lifestyle to reduce recurrence of condition will improve Ability to verbalize feelings will improve Ability to disclose and discuss suicidal ideas Ability to demonstrate self-control will improve Ability to identify and develop effective coping behaviors will improve Ability to maintain clinical measurements within normal limits will improve Compliance with prescribed medications will improve Ability to identify triggers associated with substance abuse/mental health issues will improve     Medication Management: Evaluate patient's response, side effects, and tolerance of medication regimen.  Therapeutic Interventions: 1 to 1 sessions, Unit Group sessions and Medication administration.  Evaluation of Outcomes: Not Met   RN Treatment Plan for Primary Diagnosis: MDD (major depressive disorder), recurrent, severe, with psychosis ([name]) Long Term Goal(s): Knowledge of disease and therapeutic regimen to maintain health will improve  Short Term Goals: Ability to remain free from injury will improve, Ability to verbalize frustration and anger appropriately will improve, Ability to demonstrate self-control, Ability to verbalize feelings will improve, Ability to identify and develop effective coping behaviors will improve, and Compliance with prescribed medications will improve  Medication Management: RN will administer medications as ordered by provider, will assess and evaluate patient's response and provide education to patient for prescribed medication. RN will report any adverse and/or side effects to prescribing provider.  Therapeutic Interventions: 1 on 1 counseling sessions, Psychoeducation, Medication  administration, Evaluate responses to treatment, Monitor vital signs and CBGs as ordered, Perform/monitor CIWA, COWS, AIMS and Fall Risk screenings as ordered, Perform wound care treatments as ordered.  Evaluation of Outcomes: Not Met   LCSW Treatment Plan for Primary Diagnosis: MDD (major depressive disorder), recurrent, severe, with psychosis ([place]) Long Term Goal(s): Safe transition to appropriate next level of care at discharge, Engage patient in therapeutic group addressing interpersonal concerns.  Short Term Goals: Engage patient in aftercare planning with referrals and resources, Increase social support, Increase ability to appropriately verbalize feelings, Increase emotional regulation, Identify triggers associated with mental health/substance abuse issues, and Increase skills for wellness and recovery  Therapeutic Interventions: Assess for all discharge needs, 1 to 1 time with Social worker, Explore available resources and support systems, Assess for adequacy in community support network, Educate family and significant other(s) on suicide prevention, Complete Psychosocial Assessment, Interpersonal group therapy.  Evaluation of Outcomes: Not Met   Progress in Treatment: Attending groups: Yes. Participating in groups: Yes. Taking medication as prescribed: Yes. Toleration medication: Yes. Family/Significant other contact made: No, will contact:  pt declined consents Patient understands diagnosis: Yes. Discussing patient identified problems/goals with staff: Yes. Medical problems stabilized or resolved: Yes. Denies suicidal/homicidal ideation: Yes. Issues/concerns per patient self-inventory: No.  New problem(s) identified: No, Describe:  none  New Short Term/Long Term Goal(s): detox, medication management for mood stabilization; elimination of SI thoughts; development of comprehensive mental wellness/sobriety plan  Patient Goals:  Did not attend  Discharge Plan  or Barriers:  Patient recently admitted. CSW will continue to follow and assess for appropriate referrals and possible discharge planning.    Reason for Continuation of Hospitalization: Anxiety Depression Medication stabilization Suicidal ideation  Estimated Length of Stay: 3-5 days   Scribe for Treatment Team: Vassie Moselle, LCSW 12/28/2021 10:48 AM

## 2021-12-28 NOTE — [facility] Counselor (Signed)
CSW spoke with the Pt who states that she is not sure what will happen with her housing when she leaves.  She states that she still has a home that she is paying rent for but she is concerned that she will lose her Disability Benefits in the future and will be unable to pay her rent.  She states that she is concerned about losing her Disability benefits because she was working and did not tell the Disability agency about it.  She states that she was also spending a lot of money on pills and got behind on her bills.  The Pt reports that she has been in contact with her landlord and is attempting to work out payment arrangements so she can stay in her home.  The Pt accepted the [employer] and a homeless packet from the CSW.  The Pt reports that if she loses her Disability Benefits then she will also lose her Medicaid in June of 2023.  CSW informed the Pt that she would schedule her appointments for therapy, medication management, and PCP at agencies that accept individuals with both insurance and no insurance.  The Pt states that she will need these appointment in the [place] area because she lives closer to [place] then she does to [place].  CSW will arrange these appointments prior to discharge.

## 2021-12-28 NOTE — Progress Notes (Addendum)
[facility] MD Progress Note  12/28/2021 2:33 PM Candace Howard  MRN:  [id number]  Subjective:  Candace Howard is a 51 year old female with psychiatric history of depression and self reported history of bipolar disorder, PTSD, and anxiety and medical history of asthma and neuropathy who initially presented to [name] ED for complaints of suicidal ideation without a plan.  The patient's chart was reviewed and nursing notes were reviewed. Over the past 24 hrs, there were no documented behavioral issues, no PRN medications given for agitation.The patient's case was discussed in multidisciplinary team meeting.   Evaluation on the unit today  On assessment today the patient exhibits a relatively logical thought process and is superficially goal-directed.  She reports feeling "anxiety" and feeling "weird".  She is ruminative about her psychosocial stressors that contributed to her admission. She reports not hearing any auditory hallucinations but does report some paranoia, saying that people are after me, specifically "Satan".  She is found looking away at other points of conversation in pensive manner. She becomes particularly tearful and her voice raises when she begins discussing her disability check.  She is reportedly under investigation for disability fraud, given that she was working at the same time she was receiving disability checks.  She denies suicidal and homicidal thoughts.  She denies first rank symptoms or ideas of reference. She voices no physical complaints and reports stable sleep and appetite.She states her cough is improved and she denies current CP or SOB during exam.  Principal Problem: MDD (major depressive disorder), recurrent, severe, with psychosis (Ramirez[place]Perez) Diagnosis: Principal Problem:   MDD (major depressive disorder), recurrent, severe, with psychosis ([place])  Total Time Spent in Direct Patient Care:  I personally spent 30 minutes on the unit in direct patient care. The direct patient  care time included face-to-face time with the patient, reviewing the patient's chart, communicating with other professionals, and coordinating care. Greater than 50% of this time was spent in counseling or coordinating care with the patient regarding goals of hospitalization, psycho-education, and discharge planning needs.  Past Psychiatric History: see H&P  Past Medical History:  Past Medical History:  Diagnosis Date   Anemia    Chest pain    Depression    Difficulty breathing    Frequent headaches    IBS (irritable bowel syndrome)    Palpitations    Poor circulation    Family History:  Family History  Problem Relation Age of Onset   Heart disease Father    Hypertension Father    Diabetes Father    Diabetes Paternal Grandmother    COPD Mother    Glaucoma Mother    Family Psychiatric  History: see H&P  Social History:  Social History   Substance and Sexual Activity  Alcohol Use No   Alcohol/week: 0.0 standard drinks     Social History   Substance and Sexual Activity  Drug Use No    Social History   Socioeconomic History   Marital status: Single    Spouse name: Not on file   Number of children: Not on file   Years of education: Not on file   Highest education level: Not on file  Occupational History   Not on file  Tobacco Use   Smoking status: Every Day    Packs/day: 1.00    Years: 35.00    Pack years: 35.00    Types: Cigarettes   Smokeless tobacco: Never  Vaping Use   Vaping Use: Never used  Substance and Sexual Activity  Alcohol use: No    Alcohol/week: 0.0 standard drinks   Drug use: No   Sexual activity: Not Currently  Other Topics Concern   Not on file  Social History Narrative   Not on file   Social Determinants of Health   Financial Resource Strain: Not on file  Food Insecurity: Not on file  Transportation Needs: Not on file  Physical Activity: Not on file  Stress: Not on file  Social Connections: Not on file    Sleep:  Good  Appetite:  Good  Current Medications: Current Facility-Administered Medications  Medication Dose Route Frequency Provider Last Rate Last Admin   albuterol (PROVENTIL) (2.5 MG/3ML) 0.083% nebulizer solution 2.5 mg  2.5 mg Nebulization Q6H PRN Nelda Marseille, Itzayanna Kaster E, MD   2.5 mg at 12/27/21 2226   albuterol (VENTOLIN HFA) 108 (90 Base) MCG/ACT inhaler 2 puff  2 puff Inhalation Q4H PRN Harlow Asa, MD   2 puff at 12/28/21 0731   benzonatate (TESSALON) capsule 100 mg  100 mg Oral TID PRN Harlow Asa, MD   100 mg at 12/27/21 0813   escitalopram (LEXAPRO) tablet 10 mg  10 mg Oral Daily [name], MD   10 mg at 12/28/21 0731   feeding supplement (BOOST / RESOURCE BREEZE) liquid 1 Container  1 Container Oral BID BM Harlow Asa, MD   1 Container at 12/28/21 1358   gabapentin (NEURONTIN) capsule 300 mg  300 mg Oral TID [name], MD   300 mg at 12/28/21 1142   hydrOXYzine (ATARAX) tablet 25 mg  25 mg Oral TID PRN [name], MD   25 mg at 12/27/21 2112   loperamide (IMODIUM) capsule 2 mg  2 mg Oral PRN [name], MD   2 mg at 12/26/21 1659   mometasone-formoterol (DULERA) 100-5 MCG/ACT inhaler 2 puff  2 puff Inhalation BID [name], MD   2 puff at 12/28/21 0732   nicotine (NICODERM CQ - dosed in mg/24 hours) patch 14 mg  14 mg Transdermal Daily [name], [name], NP   14 mg at 12/28/21 0731   risperiDONE (RISPERDAL) tablet 1 mg  1 mg Oral QHS [name], MD   1 mg at 12/27/21 2112    Lab Results:  Results for orders placed or performed during the hospital encounter of 12/26/21 (from the past 48 hour(s))  Basic metabolic panel     Status: Abnormal   Collection Time: 12/27/21  6:40 AM  Result Value Ref Range   Sodium 138 135 - 145 mmol/L   Potassium 4.0 3.5 - 5.1 mmol/L   Chloride 103 98 - 111 mmol/L   CO2 26 22 - 32 mmol/L   Glucose, Bld 140 (H) 70 - 99 mg/dL    Comment: Glucose reference range applies only to samples taken after fasting for at least 8 hours.    BUN 11 6 - 20 mg/dL   Creatinine, Ser 0.70 0.44 - 1.00 mg/dL   Calcium 8.3 (L) 8.9 - 10.3 mg/dL   GFR, Estimated >60 >60 mL/min    Comment: (NOTE) Calculated using the CKD-EPI Creatinine Equation (2021)    Anion gap 9 5 - 15    Comment: Performed at [facility], [place] [address], [place], [place] [address]  Lipid panel     Status: Abnormal   Collection Time: 12/27/21  6:40 AM  Result Value Ref Range   Cholesterol 182 0 - 200 mg/dL   Triglycerides 114 <150 mg/dL   HDL 49 >  40 mg/dL   Total CHOL/HDL Ratio 3.7 RATIO   VLDL 23 0 - 40 mg/dL   LDL Cholesterol 110 (H) 0 - 99 mg/dL    Comment:        Total Cholesterol/HDL:CHD Risk Coronary Heart Disease Risk Table                     Men   Women  1/2 Average Risk   3.4   3.3  Average Risk       5.0   4.4  2 X Average Risk   9.6   7.1  3 X Average Risk  23.4   11.0        Use the calculated Patient Ratio above and the CHD Risk Table to determine the patient's CHD Risk.        ATP III CLASSIFICATION (LDL):  <100     mg/dL   Optimal  100-129  mg/dL   Near or Above                    Optimal  130-159  mg/dL   Borderline  160-189  mg/dL   High  >190     mg/dL   Very High Performed at [facility] [address] [place], [place] [address]   HIV Antibody (routine testing w rflx)     Status: None   Collection Time: 12/27/21  6:23 PM  Result Value Ref Range   HIV Screen 4th Generation wRfx Non Reactive Non Reactive    Comment: Performed at [facility], [place] [address], [place], [place] [address]  Hepatitis panel, acute     Status: Abnormal   Collection Time: 12/27/21  6:23 PM  Result Value Ref Range   Hepatitis B Surface Ag NON REACTIVE NON REACTIVE   HCV Ab Reactive (A) NON REACTIVE    Comment: (NOTE) The CDC recommends that a Reactive HCV antibody result be followed up  with a HCV Nucleic Acid Amplification test.     Hep A IgM NON REACTIVE NON REACTIVE   Hep B C IgM NON REACTIVE NON  REACTIVE    Comment: Performed at [facility], [place] [address], [place], [place] [address]  RPR     Status: None   Collection Time: 12/27/21  6:23 PM  Result Value Ref Range   RPR Ser Ql NON REACTIVE NON REACTIVE    Comment: Performed at [facility], [place] [address], [place], [place] [address]    Blood Alcohol level:  Lab Results  Component Value Date   ETH <10 99991111    Metabolic Disorder Labs: Lab Results  Component Value Date   HGBA1C 5.4 12/25/2021   MPG 108.28 12/25/2021   No results found for: PROLACTIN Lab Results  Component Value Date   CHOL 182 12/27/2021   TRIG 114 12/27/2021   HDL 49 12/27/2021   CHOLHDL 3.7 12/27/2021   VLDL 23 12/27/2021   LDLCALC 110 (H) 12/27/2021   LDLCALC 130 (H) 12/25/2021    Physical Findings:  Musculoskeletal: Strength & Muscle Tone: within normal limits Gait & Station: normal Patient leans: N/A  Psychiatric Specialty Exam:  Presentation  General Appearance: casually dressed, fair hygiene  Eye Contact:  poor Speech: mumbling quality, normal rate Speech Volume: elevates when upset  Mood and Affect  Mood:Anxious; Dysphoric  Affect:Depressed   Thought Process  Thought Processes: relatively logical and superficially goal directed  Descriptions of Associations:Intact  Orientation:Full (  Time, Place and Person)  Thought Content:Ruminations; Paranoid Ideation - denies AVH, ideas of reference or first rank symptoms, denies SI or HI  Hallucinations:Hallucinations: None  Ideas of Reference:Denied  Suicidal Thoughts:Suicidal Thoughts: No  Homicidal Thoughts:Homicidal Thoughts: No   Sensorium  Memory:Recent Fair; Remote Fair; Immediate Poor  Judgment:Poor  Insight:shallow   Executive Functions  Concentration:Fair  Attention Span:Fair  [place]  Language:Good   Psychomotor Activity  Psychomotor Activity:Psychomotor Activity: Normal   Assets  Assets:Desire  for Improvement; Communication Skills   Sleep  Total time unrecorded   Physical Exam Vitals reviewed.  HENT:     Head: Normocephalic.  Pulmonary:     Effort: Pulmonary effort is normal.  Neurological:     General: No focal deficit present.     Mental Status: She is alert.   Review of Systems  Constitutional:  Negative for fever.  Respiratory:  Positive for cough. Negative for shortness of breath.   Cardiovascular:  Negative for chest pain.  Gastrointestinal:  Negative for diarrhea, nausea and vomiting.  Patient reports chronic chest pain that is unchanged in character Blood pressure 117/82, pulse 74, temperature 98.3 F (36.8 C), temperature source Oral, resp. rate 18, height 5\' 3"  (1.6 m), weight 63 kg, last menstrual period 10/20/2021, SpO2 97 %. Body mass index is 24.59 kg/m.   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 Asthma/cough Chronic urinary incontinence Abnormal EKG Hyponatremia - resolved Diarrhea - resolved   Safety and Monitoring -- VOLUNTARY admission to inpatient psychiatric unit for safety, stabilization and treatment -- Daily contact with patient to assess and evaluate symptoms and progress in treatment -- Patient's case to be discussed in multi-disciplinary team meeting -- Observation Level : q15 minute checks -- Vital signs:  q12 hours -- Precautions: suicide   Psychiatric Diagnoses:  MDD, recurrent, severe with psychotic features (r/o schizoaffective d/o, r/o bipolar depressed severe with psychotic features) -Continue risperidone 1 mg nightly for paranoia and AH.  We will titrate if needed during this hospitalization. 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   Mild Hyponatremia  - resolved - Recheck BMP shows Na+ 138 on 12/27/21  Diarrhea - resolved -Continue Imodium 4 mg   Chronic urinary incontinence -urinalysis to rule out UTI, pending   Abnormal EKG (r/o inferior and anterior infarct age indeterminate) - Repeat EKG re-ordered  Screening for STD 's -HCV Ab positive -Follow up HCV RNA quant RPR and HIV negative GC/chlamydia pending   PRN's  -Continue Tylenol 650 mg every 6 hours as needed for pain or fever -Continue Hydroxyzine 25  mg TID PRN for Anxiety.  Corky Sox, MD PGY-1

## 2021-12-28 NOTE — Group Note (Signed)
[facility] LCSW Group Therapy Note ° ° °Group Date: 12/28/2021 °Start Time: 1300 °End Time: 1400 ° °Type of Group and Topic: Psychoeducational Group: Discharge Planning ° °Participation Level: Active ° °Description of Group ° °Discharge planning group reviews patient's anticipated discharge plans and assists patients to anticipate and address any barriers to wellness/recovery in the community. Suicide prevention education is reviewed with patients in group. ° °Therapeutic Goals ° °1. Patients will state their anticipated discharge plan and mental health aftercare ° °2. Patients will identify potential barriers to wellness in the community setting ° °3. Patients will engage in problem solving, solution focused discussion of ways to anticipate and address barriers to wellness/recovery ° °Summary of Patient Progress: The Pt attended group and remained there the entire time.  The Pt discussed their Anxiety about discharging from the hospital, concerns they have about returning home, and what they can do to calm their Anxiety and create positive changes in their life.  The Pt was respectful of their peers and remained on topic during the discussion.  ° °Therapeutic Modalities:   °Cognitive Behavioral Therapy °Brief Therapy °Feelings Identification  ° ° °Joao Mccurdy M Eshani Maestre, LCSWA °

## 2021-12-28 NOTE — Progress Notes (Signed)
Progress note  Pt found in bed; compliant with medication administration. Pt presented with sob of this morning. Medication was provided which alleviated this. Pt presents anxious, sad, sullen, and worried. Pt was pleasant though. Pt's hygiene seemed to improve throughout the day, changing from scrubs to their own clothes. Pt still expresses worrying with outside stressors. Pt denies si/hi/ah/vh and verbally agrees to approach staff if these become apparent or before harming themselves/others while at [facility].  A: Pt provided support and encouragement. Pt given medication per protocol and standing orders. Q13m safety checks implemented and continued.  R: Pt safe on the unit. Will continue to monitor.

## 2021-12-28 NOTE — Progress Notes (Signed)
°   12/28/21 2120  Psych Admission Type (Psych Patients Only)  Admission Status Voluntary  Psychosocial Assessment  Patient Complaints Anxiety;Worrying  Eye Contact Fair  Facial Expression Pained  Affect Preoccupied;Appropriate to circumstance  Speech Logical/coherent  Interaction Assertive  Motor Activity Slow  Appearance/Hygiene In hospital gown  Behavior Characteristics Cooperative;Appropriate to situation  Mood Anxious;Pleasant  Thought Process  Coherency Concrete thinking  Content WDL  Delusions None reported or observed  Perception WDL  Hallucination None reported or observed  Judgment Poor  Confusion None  Danger to Self  Current suicidal ideation? Denies  Danger to Others  Danger to Others None reported or observed

## 2021-12-28 NOTE — Progress Notes (Signed)
Pt. Attended the evening A.A. meeting and was appropriate. She shared some of her struggles with sobriety with the group.

## 2021-12-28 NOTE — Progress Notes (Signed)
Pt was approached and educated on EKG. Pt declined at this time because of anxiety surrounding mounting costs and current anxiety level. X1 PRN anxiety medication order obtained and given. Pt resting now. Will continue to monitor.

## 2021-12-28 NOTE — [facility] Counselor (Signed)
CSW provided the Pt with a packet that contains housing and shelter resources, free and reduced price food resources, clothing resources, a Actor, Suicide Prevention information, and a list of [address] in the [place], [place], and [place].

## 2021-12-29 DIAGNOSIS — G629 Polyneuropathy, unspecified: Secondary | ICD-10-CM | POA: Insufficient documentation

## 2021-12-29 DIAGNOSIS — R251 Tremor, unspecified: Secondary | ICD-10-CM | POA: Diagnosis present

## 2021-12-29 NOTE — Progress Notes (Signed)
Progress note  Pt found in bed; compliant with medication administration. Pt still presents anxious. Pt didn't have c/o of sob today. Pt has been seen in groups and resting in between. Pt expresses blaming self with their life and choices in the past. Pt is pleasant. Pt denies si/hi/ah/vh and verbally agrees to approach staff if these become apparent or before harming themselves/others while at [facility].  A: Pt provided support and encouragement. Pt given medication per protocol and standing orders. Q20m safety checks implemented and continued.  R: Pt safe on the unit. Will continue to monitor.
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
## 2021-12-29 NOTE — Group Note (Signed)
Recreation Therapy Group Note   Group Topic:Animal Assisted Therapy   Group Date: 12/29/2021 Start Time: 1430 End Time: 1510 Facilitators: Victorino Sparrow, LRT,CTRS Location: [address] Dayroom   Animal-Assisted Activity (AAA) Program Checklist/Progress Note Patient Eligibility Criteria Checklist & Daily Group note for Rec Tx Intervention  AAA/T Program Assumption of Risk Form signed by Patient/ or Parent Legal Guardian YES  Patient understands their participation is voluntary YES  Group Description: Patients provided opportunity to interact with trained and credentialed [employer] Therapy dog and the community volunteer/dog handler. Patients practiced appropriate animal interaction and were educated on dog safety outside of the hospital in common community settings. Patients were allowed to use dog toys and other items to practice commands, engage the dog in play, and/or complete routine aspects of animal care.   Education: Contractor, Pensions consultant, Communication & Social Skills    Affect/Mood: N/A   Participation Level: Did not attend    Clinical Observations/Individualized Feedback:     Plan: Continue to engage patient in RT group sessions 2-3x/week.   Victorino Sparrow, Glennis Brink 12/29/2021 3:57 PM

## 2021-12-29 NOTE — [facility] Group Notes (Signed)
[facility] Group Notes:  (Nursing/MHT/Case Management/Adjunct)  Date:  12/29/2021  Time:  4:19 PM  Type of Therapy:  Group Therapy  Participation Level:  Active  Participation Quality:  Appropriate  Affect:  Appropriate  Cognitive:  Appropriate  Insight:  Appropriate  Engagement in Group:  Engaged  Modes of Intervention:  Discussion  Summary of Progress/Problems: Patient was able to verbalize her need to learn how to use her coping skills in order to have a better sense of well-being.   Reymundo Poll 12/29/2021, 4:19 PM

## 2021-12-29 NOTE — Plan of Care (Signed)
°  Problem: Health Behavior/Discharge Planning: Goal: Ability to make decisions will improve Outcome: Progressing Goal: Compliance with therapeutic regimen will improve Outcome: Progressing   Problem: Role Relationship: Goal: Will demonstrate positive changes in social behaviors and relationships Outcome: Progressing   

## 2021-12-29 NOTE — Progress Notes (Signed)
°   12/29/21 2147  Psych Admission Type (Psych Patients Only)  Admission Status Voluntary  Psychosocial Assessment  Patient Complaints Anxiety;Depression  Eye Contact Fair  Facial Expression Pained  Affect Anxious;Depressed  Speech Logical/coherent  Interaction Assertive  Motor Activity Slow  Appearance/Hygiene In scrubs  Behavior Characteristics Cooperative;Appropriate to situation  Mood Depressed;Anxious  Thought Process  Coherency Concrete thinking  Content Blaming self  Delusions None reported or observed  Perception WDL  Hallucination None reported or observed  Judgment Poor  Confusion None  Danger to Self  Current suicidal ideation? Denies  Danger to Others  Danger to Others None reported or observed

## 2021-12-30 DIAGNOSIS — F333 Major depressive disorder, recurrent, severe with psychotic symptoms: Principal | ICD-10-CM

## 2021-12-30 MED ORDER — HYDROXYZINE HCL 25 MG PO TABS: 25.0000 mg | ORAL_TABLET | Freq: Three times a day (TID) | ORAL | 0 refills | Status: DC | PRN

## 2021-12-30 MED ORDER — NICOTINE 14 MG/24HR TD PT24: 14.0000 mg | MEDICATED_PATCH | Freq: Every day | TRANSDERMAL | 0 refills | Status: DC

## 2021-12-30 MED ORDER — RISPERIDONE 1 MG PO TABS: 1.0000 mg | ORAL_TABLET | Freq: Every day | ORAL | 1 refills | Status: DC

## 2021-12-30 MED ORDER — ESCITALOPRAM OXALATE 10 MG PO TABS: 10.0000 mg | ORAL_TABLET | Freq: Every day | ORAL | 1 refills | Status: DC

## 2021-12-30 NOTE — Progress Notes (Signed)
°  [facility] Adult Case Management Discharge Plan :  Will you be returning to the same living situation after discharge:  Yes,  Home  At discharge, do you have transportation home?: Yes,  Safe Transport to own car Do you have the ability to pay for your medications: Yes,  Medicaid   Release of information consent forms completed and in the chart;  Patient's signature needed at discharge.  Patient to Follow up at:  Follow-up Information     [facility] Follow up.   Specialty: Urgent Care Why: You may go to this provider for therapy and medication management services during walk in hours:  Monday through Wednesday, from 7:45 am to 11:00 am.  Services are provided on a first come, first served basis. Contact information: [address] [place] [address] [phone]        [facility]. Go on 01/20/2022.   Why: You have an appointment to establish care for primary care services on 01/20/22 at 10:45 am.  This appointment will be held in person. Contact information: [address] [place] [address] [phone]        [facility]. Schedule an appointment as soon as possible for a visit.   Contact information: [address] [place], [address] [place] [address] [phone]                Next level of care provider has access to [facility] Link:yes  Safety Planning and Suicide Prevention discussed: Yes,  with patient      Has patient been referred to the Quitline?: Patient refused referral  Patient has been referred for addiction treatment: N/A  Aram Beecham, LCSWA 12/30/2021, 10:30 AM
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
## 2021-12-30 NOTE — Group Note (Signed)
Date:  12/30/2021 Time:  9:42 AM  Group Topic/Focus:  Orientation:   The focus of this group is to educate the patient on the purpose and policies of crisis stabilization and provide a format to answer questions about their admission.  The group details unit policies and expectations of patients while admitted.    Participation Level:  Active  Participation Quality:  Appropriate  Affect:  Appropriate  Cognitive:  Appropriate  Insight: Appropriate  Engagement in Group:  Engaged  Modes of Intervention:  Discussion  Additional Comments:  Pt was engaged in group.  Jaquita Rector 12/30/2021, 9:42 AM

## 2021-12-30 NOTE — Progress Notes (Signed)
Adult Psychoeducational Group Note  Date:  12/30/2021 Time:  12:26 AM  Group Topic/Focus:  Wrap-Up Group:   The focus of this group is to help patients review their daily goal of treatment and discuss progress on daily workbooks.  Participation Level:  Active  Participation Quality:  Appropriate  Affect:  Appropriate  Cognitive:  Appropriate  Insight: Appropriate  Engagement in Group:  Engaged  Modes of Intervention:  Discussion  Additional Comments:  patient said her day was 8. Her goal to think positive she somewhat achieved her goal her coping skills praying. Patient said she need help someone to care for her.  Candace Howard Long 12/30/2021, 12:26 AM

## 2021-12-30 NOTE — Group Note (Unsigned)
Date:  12/30/2021 °Time:  9:34 AM ° °Group Topic/Focus:  °Orientation:   The focus of this group is to educate the patient on the purpose and policies of crisis stabilization and provide a format to answer questions about their admission.  The group details unit policies and expectations of patients while admitted. ° ° ° ° °Participation Level:  {[facility] PARTICIPATION LEVEL:22264} ° °Participation Quality:  {[facility] PARTICIPATION QUALITY:22265} ° °Affect:  {[facility] AFFECT:22266} ° °Cognitive:  {[facility] COGNITIVE:22267} ° °Insight: {BHH Insight2:20797} ° °Engagement in Group:  {[facility] ENGAGEMENT IN GROUP:22268} ° °Modes of Intervention:  {[facility] MODES OF INTERVENTION:22269} ° °Additional Comments:  *** ° °Candace Howard °12/30/2021, 9:34 AM ° °

## 2021-12-30 NOTE — Progress Notes (Signed)
D: Pt A & O X 4. Denies SI, HI, AVH and pain at this time. D/C home as ordered. Picked up in lobby by Benedetto Goad to transport to [employer] for her vehicle. A: D/C instructions reviewed with pt including electronic prescriptions,  and follow up appointments; compliance encouraged. All belongings from locker 6 returned to pt at time of departure. Scheduled and PRN medications given with verbal education and effects monitored. Safety checks maintained without incident till time of d/c.  R: Pt receptive to care. Compliant with medications when offered. Denies adverse drug reactions when assessed. Verbalized understanding related to d/c instructions. Signed belonging sheet in agreement with items received from locker. Ambulatory with a steady gait. Appears to be in no physical distress at time of departure.
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
## 2021-12-31 LAB — HCV RNA QUANT
HCV Quantitative Log: 5.143 log10 IU/mL (ref >1.70)
HCV Quantitative: 139000 IU/mL (ref >50)

## 2022-01-01 NOTE — Progress Notes (Signed)
Hep C RNA of 139K. Attempted to contact patient x1 today, with no success. Will continue to try.   Carlyn Reichert, MD PGY-1

## 2022-01-04 NOTE — Progress Notes (Signed)
Another attempt made to reach patient at cell phone listed in chart, with no success.   Carlyn Reichert, MD PGY-1

## 2022-01-05 NOTE — Plan of Care (Signed)
I called the patient on 01/05/22 and left a message with her son for her to return my call. The patient called back on 01/05/22, and I made her aware of her positive Hep C viral quant. I discussed the need to see her PCP for treatment/management of Hep C and discussed the need for blood borne precautions and safe sex practices given the contagious nature of the disease. The Health Dept form was filled out and to be faxed by [facility] notifying them of her positive Hep C status.   Bartholomew Crews, MD, Celene Skeen

## 2022-01-11 ENCOUNTER — Other Ambulatory Visit: Payer: Self-pay

## 2022-01-11 ENCOUNTER — Encounter (HOSPITAL_COMMUNITY): Payer: Self-pay | Admitting: Licensed Clinical Social Worker

## 2022-01-11 ENCOUNTER — Ambulatory Visit (INDEPENDENT_AMBULATORY_CARE_PROVIDER_SITE_OTHER): Payer: Medicaid Other | Admitting: Licensed Clinical Social Worker

## 2022-01-11 ENCOUNTER — Ambulatory Visit (INDEPENDENT_AMBULATORY_CARE_PROVIDER_SITE_OTHER): Payer: Medicaid Other | Admitting: Psychiatry

## 2022-01-11 ENCOUNTER — Encounter (HOSPITAL_COMMUNITY): Payer: Self-pay | Admitting: Psychiatry

## 2022-01-11 VITALS — BP 113/76 | HR 79 | Ht 63.0 in | Wt 146.0 lb

## 2022-01-11 DIAGNOSIS — F411 Generalized anxiety disorder: Secondary | ICD-10-CM | POA: Diagnosis not present

## 2022-01-11 DIAGNOSIS — F17200 Nicotine dependence, unspecified, uncomplicated: Secondary | ICD-10-CM

## 2022-01-11 DIAGNOSIS — F3160 Bipolar disorder, current episode mixed, unspecified: Secondary | ICD-10-CM

## 2022-01-11 DIAGNOSIS — F259 Schizoaffective disorder, unspecified: Secondary | ICD-10-CM | POA: Insufficient documentation

## 2022-01-11 MED ORDER — HYDROXYZINE HCL 25 MG PO TABS
25.0000 mg | ORAL_TABLET | Freq: Three times a day (TID) | ORAL | 3 refills | Status: DC | PRN
  Filled 2022-01-11: qty 90, 30d supply, fill #0

## 2022-01-11 MED ORDER — RISPERIDONE 2 MG PO TABS
2.0000 mg | ORAL_TABLET | Freq: Every day | ORAL | 3 refills | Status: DC
  Filled 2022-01-11 – 2022-01-20 (×2): qty 30, 30d supply, fill #0
  Filled 2022-02-28: qty 30, 30d supply, fill #1
  Filled 2022-03-28: qty 30, 30d supply, fill #2

## 2022-01-11 MED ORDER — ESCITALOPRAM OXALATE 10 MG PO TABS
10.0000 mg | ORAL_TABLET | Freq: Every day | ORAL | 3 refills | Status: DC
  Filled 2022-01-11 – 2022-01-20 (×2): qty 30, 30d supply, fill #0
  Filled 2022-02-28: qty 30, 30d supply, fill #1
  Filled 2022-03-28: qty 30, 30d supply, fill #2

## 2022-01-11 MED ORDER — GABAPENTIN 400 MG PO CAPS
400.0000 mg | ORAL_CAPSULE | Freq: Three times a day (TID) | ORAL | 3 refills | Status: DC
  Filled 2022-01-11: qty 90, 30d supply, fill #0
  Filled 2022-02-15: qty 90, 30d supply, fill #1
  Filled 2022-03-28: qty 90, 30d supply, fill #2

## 2022-01-11 MED ORDER — NICOTINE 14 MG/24HR TD PT24
14.0000 mg | MEDICATED_PATCH | Freq: Every day | TRANSDERMAL | 3 refills | Status: DC
  Filled 2022-01-11 – 2022-03-03 (×2): qty 28, 28d supply, fill #0
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
## 2022-01-20 ENCOUNTER — Telehealth: Payer: Self-pay | Admitting: Physician Assistant

## 2022-01-20 ENCOUNTER — Ambulatory Visit: Payer: Medicaid Other | Attending: Physician Assistant | Admitting: Physician Assistant

## 2022-01-20 ENCOUNTER — Other Ambulatory Visit: Payer: Self-pay

## 2022-01-20 ENCOUNTER — Encounter: Payer: Self-pay | Admitting: Physician Assistant

## 2022-01-20 ENCOUNTER — Telehealth: Payer: Self-pay

## 2022-01-20 VITALS — BP 122/78 | HR 75 | Resp 16 | Wt 151.4 lb

## 2022-01-20 DIAGNOSIS — M79673 Pain in unspecified foot: Secondary | ICD-10-CM | POA: Insufficient documentation

## 2022-01-20 DIAGNOSIS — Z09 Encounter for follow-up examination after completed treatment for conditions other than malignant neoplasm: Secondary | ICD-10-CM

## 2022-01-20 DIAGNOSIS — K146 Glossodynia: Secondary | ICD-10-CM | POA: Diagnosis not present

## 2022-01-20 DIAGNOSIS — R251 Tremor, unspecified: Secondary | ICD-10-CM | POA: Diagnosis not present

## 2022-01-20 DIAGNOSIS — Z7182 Exercise counseling: Secondary | ICD-10-CM | POA: Diagnosis not present

## 2022-01-20 DIAGNOSIS — E871 Hypo-osmolality and hyponatremia: Secondary | ICD-10-CM | POA: Insufficient documentation

## 2022-01-20 DIAGNOSIS — M549 Dorsalgia, unspecified: Secondary | ICD-10-CM | POA: Insufficient documentation

## 2022-01-20 DIAGNOSIS — F1021 Alcohol dependence, in remission: Secondary | ICD-10-CM

## 2022-01-20 DIAGNOSIS — B1920 Unspecified viral hepatitis C without hepatic coma: Secondary | ICD-10-CM | POA: Diagnosis not present

## 2022-01-20 DIAGNOSIS — G252 Other specified forms of tremor: Secondary | ICD-10-CM | POA: Insufficient documentation

## 2022-01-20 NOTE — Telephone Encounter (Signed)
Returned pt call to see what follow up questions she has. Pt states she feels that she is going through menopause. Made pt aware that we will need to get blood work for that to confirm pt states she has an appt on 5/9 and will wait till then

## 2022-01-20 NOTE — Progress Notes (Signed)
Patient ID: Candace Howard, female   DOB: [birth year], 51 y.o.   MRN: [id number]    Candace Howard, is a 51 y.o. female  [id number]  [id number]  DOB - Feb 19, 1971  Chief Complaint  Patient presents with   Hospitalization Follow-up   Back Pain   Foot Pain    left   tongue pain       Subjective:   Candace Howard is a 51 y.o. female here today for a follow up visit and to establish care.   After hospitalization 1/7-1/10/2022 for SI and labile/unstable moods.  She is doing much better.  Says she is picking up prescriptions today.  Had psych and SW f/up since discharge.  No SI/HI.  She says she is attending AA and NA and has a lot of support.  Her son is with her today.  No new issues or concerns.  She has not scheduled f/up with ID regarding +Hep C titer or neurology regarding tremor  Hospital Course:   On admission to the psychiatric hospital, the above patient was noted to be tangential and circumstantial in her thought process as well as depressed and labile in her mood. She experienced suicidal thoughts and auditory hallucinations in the emergency department before admission. She had been receiving risperdal consta for some time previously (though not recently). She was started on Risperdal 1 mg QHS in addition to Lexapro 10 mg for depressive symptoms. Her previous gabapentin 300 mg TID was restarted for anxiety and neuropathy. She tolerated these medications well. On discharge she denied experiencing auditory hallucinations for several days and had denied SI consistently. She still exhibited some mood lability, but her thought process was more logical and linear.    It was noticed that the patient had subconscious R leg kicking that disappeared when her attention was focused on it. She reports this has been going on for a year. Given that the patient has experienced bowel and bladder incontinence over the past year and was noted to have a fine intention tremor, there was some concern for  a nonemergent  underlying neurological condition. She was reffered to outptient neurology.   Principal Problem: MDD (major depressive disorder), recurrent, severe, with psychosis ([place]) Discharge Diagnoses: Principal Problem:   MDD (major depressive disorder), recurrent, severe, with psychosis ([place]) Active Problems:   Anxiety state   Tremor  Past Psychiatric History: Reported history of bipolar, depression, PTSD, anxiety She was on Latuda and risperidone.  Also received Perseris LAI.  She has been off medication for many months and missed appointments. History of suicidal attempt long time ago by overdosing. She has been hospitalized for psychiatric reasons multiple times when she was young and was last hospitalized at [facility] 10 years ago.  Patient has No headache, No chest pain, No abdominal pain - No Nausea, No new weakness tingling or numbness, No Cough - SOB.  No problems updated.  ALLERGIES: Allergies  Allergen Reactions   Hydrocodone     Other reaction(s): Unknown/See Comments "Feel really high"    PAST MEDICAL HISTORY: Past Medical History:  Diagnosis Date   Anemia    Chest pain    Depression    Difficulty breathing    Frequent headaches    IBS (irritable bowel syndrome)    Palpitations    Poor circulation     MEDICATIONS AT HOME: Prior to Admission medications   Medication Sig Start Date End Date Taking? Authorizing Provider  budesonide-formoterol (SYMBICORT) 80-4.5 MCG/ACT inhaler 2 puffs daily. 06/15/21   [provider]  escitalopram (LEXAPRO) 10 MG tablet Take 1 tablet (10 mg total) by mouth daily. 01/11/22   Salley Slaughter, NP  gabapentin (NEURONTIN) 400 MG capsule Take 1 capsule (400 mg total) by mouth 3 (three) times daily. 01/11/22   Salley Slaughter, NP  hydrOXYzine (ATARAX) 25 MG tablet Take 1 tablet (25 mg total) by mouth 3 (three) times daily as needed for anxiety. 01/11/22   Eulis Canner E, NP  nicotine (NICODERM CQ - DOSED IN  MG/24 HOURS) 14 mg/24hr patch Place 1 patch (14 mg total) onto the skin daily. 01/11/22   Salley Slaughter, NP  risperiDONE (RISPERDAL) 2 MG tablet Take 1 tablet (2 mg total) by mouth at bedtime. 01/11/22   Salley Slaughter, NP    ROS: Neg HEENT Neg resp Neg cardiac Neg GI Neg GU Neg MS   Objective:   Vitals:   01/20/22 1050  BP: 122/78  Pulse: 75  Resp: 16  SpO2: 97%  Weight: 151 lb 6.4 oz (68.7 kg)   Exam General appearance : Awake, alert, not in any distress. Speech Clear. Not toxic looking HEENT: Atraumatic and Normocephalic Neck: Supple, no JVD. No cervical lymphadenopathy.  Chest: Good air entry bilaterally, CTAB.  No rales/rhonchi/wheezing CVS: S1 S2 regular, no murmurs.  Extremities: B/L Lower Ext shows no edema, both legs are warm to touch Neurology: Awake alert, and oriented X 3, CN II-XII intact, Non focal Skin: No Rash  Data Review Lab Results  Component Value Date   HGBA1C 5.4 12/25/2021    Assessment & Plan   1. Hepatitis C virus infection without hepatic coma, unspecified chronicity Normal LFT - Ambulatory referral to Infectious Disease  2. Hyponatremia Drink adequate water - Basic metabolic panel  3. Tremor - Ambulatory referral to Neurology  4. Alcohol dependence in remission [facility]) with memory deficits [place].org is the website for narcotics anonymous [place] (website) or 915-536-0166 is the information for alcoholics anonymous Both are free and immediately available for help with alcohol and drug use - Ambulatory referral to Neurology   5. Hospital discharge follow-up Will have our LCSW reach out to check for gaps in care     Patient have been counseled extensively about nutrition and exercise. Other issues discussed during this visit include: low cholesterol diet, weight control and daily exercise, foot care, annual eye examinations at Ophthalmology, importance of adherence with medications and regular follow-up. We also  discussed long term complications of uncontrolled diabetes and hypertension.   Return for 2 months to establish care with one of our PCP.  The patient was given clear instructions to go to ER or return to medical center if symptoms don't improve, worsen or new problems develop. The patient verbalized understanding. The patient was told to call to get lab results if they haven't heard anything in the next week.      Freeman Caldron, PA-C [facility], [place]   01/20/2022, 11:09 AM

## 2022-01-20 NOTE — Telephone Encounter (Signed)
Copied from CRM [id number]. Topic: General - Other >> Jan 20, 2022  3:51 PM Pawlus, Maxine Glenn A wrote: Reason for CRM: Pt called in with some follow up questions regarding her visit today, pt stated she wanted to address a few more things and requested a call back.

## 2022-01-21 LAB — BASIC METABOLIC PANEL
BUN/Creatinine Ratio: 9 (ref 9–23)
BUN: 6 mg/dL (ref 6–24)
CO2: 22 mmol/L (ref 20–29)
Calcium: 8.8 mg/dL (ref 8.7–10.2)
Chloride: 102 mmol/L (ref 96–106)
Creatinine, Ser: 0.67 mg/dL (ref 0.57–1.00)
Glucose: 122 mg/dL — ABNORMAL HIGH (ref 70–99)
Potassium: 4.4 mmol/L (ref 3.5–5.2)
Sodium: 143 mmol/L (ref 134–144)
eGFR: 108 mL/min/{1.73_m2} (ref >59)

## 2022-01-26 DIAGNOSIS — B182 Chronic viral hepatitis C: Secondary | ICD-10-CM | POA: Insufficient documentation

## 2022-01-28 ENCOUNTER — Telehealth: Payer: Self-pay

## 2022-01-28 ENCOUNTER — Other Ambulatory Visit: Payer: Self-pay

## 2022-01-28 ENCOUNTER — Encounter: Payer: Self-pay | Admitting: Internal Medicine

## 2022-01-28 ENCOUNTER — Other Ambulatory Visit (HOSPITAL_COMMUNITY): Payer: Self-pay

## 2022-01-28 ENCOUNTER — Ambulatory Visit (INDEPENDENT_AMBULATORY_CARE_PROVIDER_SITE_OTHER): Payer: Medicaid Other | Admitting: Internal Medicine

## 2022-01-28 DIAGNOSIS — B182 Chronic viral hepatitis C: Secondary | ICD-10-CM
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
## 2022-01-28 NOTE — Telephone Encounter (Signed)
[facility] Patient Advocate Encounter  Insurance verification completed.    The patient is insured through  Medicaid  and has a 4.00 copay.  Medication will need a PA.  We will continue to follow to see if copay assistance is needed.  Ethon Wymer, CPhT [facility] Patient [facility] Phone: [phone] Fax:  [phone]  

## 2022-01-28 NOTE — Patient Instructions (Addendum)
Emergency Assistance  [employer] [address] [place], [place] Phone: [phone] Website: CompleteWords.pl  [facility] [address], [address], [place], [place] Phone: [phone] Website: http://greensborohousingcoalition.com  [facility] [address] [place], [place] [address] Phone: [phone] Website: http://greensborourbanministry.org  [place] [employer] - [employer] 2-1-1 any time 24 hours a day, 365 days a year to link to vital services in your community. This service is free and multilingual. Phone: 211 Website: http://www.nc211.[facility] Department of Social Services [address] [place], [place] [address] Phone: [phone] Website: http://[email or web address]/government/socservices

## 2022-02-03 ENCOUNTER — Telehealth: Payer: Self-pay | Admitting: Neurology

## 2022-02-03 ENCOUNTER — Encounter: Payer: Self-pay | Admitting: Neurology

## 2022-02-03 ENCOUNTER — Ambulatory Visit: Payer: Medicaid Other | Admitting: Neurology

## 2022-02-03 VITALS — BP 102/66 | HR 82 | Ht 63.0 in | Wt 152.4 lb

## 2022-02-03 DIAGNOSIS — R251 Tremor, unspecified: Secondary | ICD-10-CM | POA: Diagnosis not present

## 2022-02-03 LAB — LIVER FIBROSIS, FIBROTEST-ACTITEST
ALT: 45 U/L — ABNORMAL HIGH (ref 6–29)
Alpha-2-Macroglobulin: 187 mg/dL (ref 106–279)
Apolipoprotein A1: 170 mg/dL (ref 101–198)
Bilirubin: 0.3 mg/dL (ref 0.2–1.2)
Fibrosis Score: 0.05
GGT: 22 U/L (ref 3–55)
Haptoglobin: 254 mg/dL — ABNORMAL HIGH (ref 43–212)
Necroinflammat ACT Score: 0.19
Reference ID: 4229990

## 2022-02-03 LAB — PROTIME-INR
INR: 1
Prothrombin Time: 10 s (ref 9.0–11.5)

## 2022-02-03 LAB — HEPATITIS C GENOTYPE

## 2022-02-03 NOTE — Progress Notes (Signed)
Subjective:    Patient ID: Candace Howard is a 51 y.o. female.  HPI    Candace Foley, MD, PhD [facility] [address], [address] [address] [place], [place] [address]   Dear Candace Howard,   I saw your patient, Candace Howard, upon your kind request in my neurologic clinic today for initial consultation of her tremor.  The patient is accompanied by her son, Candace Howard, today. As you know Candace Howard is a 51 year old right-handed woman with an underlying medical history of irritable bowel syndrome, palpitations, asthma, smoking, alcohol and substance use disorder (none in 3 months, per patient), hepatitis C, anemia, and mildly overweight state, who reports that she was noted to have a hand tremor when she was hospitalized at behavioral health last month.  She reports that she herself had not noticed any tremor.  She has occasional leg movements but reports that she feels restless and stressed and has control over her leg movements when she really puts her mind to it.  She has not been bothered by her hand tremor, denies any loss of consciousness or involuntary shaking or convulsion.  She reports that within the past month her tremor has actually improved.  I reviewed your office note from 01/20/2022.  I reviewed her discharge summary from 12/30/2021 as well. She admits that she does not drink a whole lot of water.  She reports drinking quite a bit of caffeine in the form of coffee, about 4 to 5 cups/day and soda, approximately 4 to 5 cans/day.  She is working on smoking cessation but finds it very difficult, she does endorse stress.  She lives alone.  She has 1 son.  She also reports intermittent blurry vision and difficulty concentrating, needing help with simple paperwork, feels too overwhelming.  Her Past Medical History Is Significant For: Past Medical History:  Diagnosis Date   Anemia    Chest pain    Depression    Difficulty breathing    Frequent headaches    IBS (irritable  bowel syndrome)    Palpitations    Poor circulation     Her Past Surgical History Is Significant For: History reviewed. No pertinent surgical history.  Her Family History Is Significant For: Family History  Problem Relation Age of Onset   Heart disease Father    Hypertension Father    Diabetes Father    Diabetes Paternal Grandmother    COPD Mother    Glaucoma Mother     Her Social History Is Significant For: Social History   Socioeconomic History   Marital status: Single    Spouse name: Not on file   Number of children: Not on file   Years of education: Not on file   Highest education level: Not on file  Occupational History   Not on file  Tobacco Use   Smoking status: Every Day    Packs/day: 1.00    Years: 35.00    Pack years: 35.00    Types: Cigarettes   Smokeless tobacco: Never  Vaping Use   Vaping Use: Never used  Substance and Sexual Activity   Alcohol use: Not Currently    Comment: 2 months sober.   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   Sexual activity: Not Currently  Other Topics Concern   Not on file  Social History Narrative   Not on file   Social Determinants of Health   Financial Resource Strain: High Risk   Difficulty  of Paying Living Expenses: Hard  Food Insecurity: No Food Insecurity   Worried About Running Out of Food in the Last Year: Never true   Ran Out of Food in the Last Year: Never true  Transportation Needs: No Transportation Needs   Lack of Transportation (Medical): No   Lack of Transportation (Non-Medical): No  Physical Activity: Inactive   Days of Exercise per Week: 0 days   Minutes of Exercise per Session: 0 min  Stress: Stress Concern Present   Feeling of Stress : Very much  Social Connections: Socially Isolated   Frequency of Communication with Friends and Family: More than three times a week   Frequency of Social Gatherings with Friends and Family: Three times a week   Attends Religious  Services: Never   Active Member of Clubs or Organizations: No   Attends BankerClub or Organization Meetings: Never   Marital Status: Never married    Her Allergies Are:  Allergies  Allergen Reactions   Hydrocodone     Other reaction(s): Unknown/See Comments "Feel really high"  :   Her Current Medications Are:  Outpatient Encounter Medications as of 02/03/2022  Medication Sig   albuterol (ACCUNEB) 0.63 MG/3ML nebulizer solution Take 1 ampule by nebulization. Patient unsure of dose   ALBUTEROL IN Inhale into the lungs.   budesonide-formoterol (SYMBICORT) 80-4.5 MCG/ACT inhaler 2 puffs daily.   escitalopram (LEXAPRO) 10 MG tablet Take 1 tablet (10 mg total) by mouth daily.   gabapentin (NEURONTIN) 400 MG capsule Take 1 capsule (400 mg total) by mouth 3 (three) times daily.   hydrOXYzine (ATARAX) 25 MG tablet Take 1 tablet (25 mg total) by mouth 3 (three) times daily as needed for anxiety.   nicotine (NICODERM CQ - DOSED IN MG/24 HOURS) 14 mg/24hr patch Place 1 patch (14 mg total) onto the skin daily.   risperiDONE (RISPERDAL) 2 MG tablet Take 1 tablet (2 mg total) by mouth at bedtime.   No facility-administered encounter medications on file as of 02/03/2022.  :   Review of Systems:  Out of a complete 14 point review of systems, all are reviewed and negative with the exception of these symptoms as listed below:   Review of Systems  Neurological:        Pt is here for tremors . Pt states she is having tremors in  both hands . Pt states she is having blurry vision in her eyes for 20 minutes. Pt states she is having headaches also.    Objective:  Neurological Exam  Physical Exam Physical Examination:   Vitals:   02/03/22 1104  BP: 102/66  Pulse: 82    General Examination: The patient is a very pleasant 51 y.o. female in no acute distress. She appears deconditioned.  She is mildly anxious.   HEENT: Normocephalic, atraumatic, pupils are equal, round and reactive to light,  extraocular tracking is good without limitation to gaze excursion or nystagmus noted. Hearing is grossly intact. Face is symmetric with normal facial animation. Speech is clear with no dysarthria noted. There is no hypophonia. There is no lip, neck/head, jaw or voice tremor. Neck is supple with full range of passive and active motion. There are no carotid bruits on auscultation. Oropharynx exam reveals: Moderate mouth dryness, dentures on top and several missing teeth on the bottom, she reports that her partial dentures for the bottom have broken.  Tongue protrudes centrally and palate elevates symmetrically.  Chest: Clear to auscultation without wheezing, rhonchi or crackles noted.  Heart: S1+S2+0,  regular and normal without murmurs, rubs or gallops noted.   Abdomen: Soft, non-tender and non-distended with normal bowel sounds appreciated on auscultation.  Extremities: There is no pitting edema in the distal lower extremities bilaterally.   Skin: Warm and dry with multiple areas of skin grafting in both arms and feet.    Musculoskeletal: exam reveals no obvious joint deformities, with the exception of arthritic changes in both hands.   Neurologically:  Mental status: The patient is awake, alert and oriented in all 4 spheres. Her immediate and remote memory, attention, language skills and fund of knowledge are appropriate. There is no evidence of aphasia, agnosia, apraxia or anomia. Speech is clear with normal prosody and enunciation. Thought process is linear. Mood is normal and affect is normal.  Cranial nerves II - XII are as described above under HEENT exam.  Motor exam: Normal bulk, strength and tone is noted. There is no resting tremor.  She has a very slight bilateral upper extremity postural tremor, no significant action tremor, no intention tremor.  On Archimedes spiral drawing she has mild insecurity with both hands, no obvious trembling.  Handwriting is legible, not tremulous, not  micrographic.   Romberg is negative. Reflexes are 1+ in the upper extremities and trace in the knees, absent in both ankles.  Toes are downgoing bilaterally.  Fine motor skills and coordination examination: Normal finger taps and hand movements, normal rapid alternating patting without decrement.  She has normal foot taps without decrement in amplitude.   Cerebellar testing: No dysmetria or intention tremor. There is no truncal or gait ataxia. Normal finger-to-nose and heel-to-shin bilaterally. Sensory exam: intact to light touch in the upper and lower extremities.  Gait, station and balance: She stands without difficulty and denies vertiginous symptoms or lightheadedness.  She walks without difficulty, no walking aid.  Preserved arm swing noted, no shuffling.   Assessment and Plan:  In summary, Concettina Leth is a very pleasant 51 y.o.-year old female with an underlying medical history of irritable bowel syndrome, palpitations, asthma, smoking, alcohol and substance use disorder (none in 3 months, per patient), hepatitis C, anemia, and mildly overweight state, who presents for evaluation of her bilateral hand tremor.  On examination, she has no obvious parkinsonism, overall a very mild postural tremor of both hands.  She reports that her tremor has improved in the recent past.  She was advised that tremors can be caused by multiple underlying issues including triggers like caffeine, stress, certain medications including psychotropic medications.  She is advised to continue to monitor her tremor.  I did not suggest any new medications for her today but did ask her to work on further lifestyle modification.  She is encouraged to reduce her caffeine intake and increase her water intake.  She is advised to continue to work on stress reduction and follow-up with her providers.  To rule out a structural cause for her tremor, I suggested we proceed with a brain MRI with and without contrast.  She is agreeable to  this.  She is advised to continue to work on smoking cessation.  So long as her brain MRI is without alarming findings and with stable or chronic findings, she can follow-up in this clinic as needed and is encouraged to follow-up with you as scheduled and her other providers.  She is scheduled to see infectious diseases for hepatitis C next month.  I answered all their questions today and the patient and her son were in agreement. Thank you very  much for allowing me to participate in the care of this nice patient. If I can be of any further assistance to you please do not hesitate to call me at [phone].  Sincerely,   Candace Foley, MD, PhD

## 2022-02-03 NOTE — Patient Instructions (Addendum)
You have a rather mild tremor of both hands.  I do not see any signs or symptoms of parkinson's like disease or what we call parkinsonism.   For your tremor, I would not recommend any new medication for fear of side effects (especially sleepiness) or medication interactions, especially, since your tremor improved in the last month.   Please remember, that any kind of tremor may be exacerbated by anxiety, anger, nervousness, excitement, dehydration, sleep deprivation, by caffeine, and low blood sugar values or blood sugar fluctuations. Some medications can exacerbate tremors, this includes risperdal and lexapro.   Please reduce your caffeine intake gradually and increase your water intake.   As discussed, we will do a brain scan, called MRI and call you with the test results. We will have to schedule you for this on a separate date. This test requires authorization from your insurance, and we will take care of the insurance process. So long as your brain MRI shows stable and non-acute changes and no structural cause for your tremor, you can follow up in this clinic as needed.

## 2022-02-03 NOTE — Telephone Encounter (Signed)
medicaid order sent to [facility], [facility] they will reach out to the patient to schedule.  

## 2022-02-05 NOTE — Telephone Encounter (Signed)
I spoke with pt and scheduled appt for 02/22/22.

## 2022-02-11 ENCOUNTER — Ambulatory Visit: Payer: Medicaid Other | Admitting: Sports Medicine

## 2022-02-14 ENCOUNTER — Other Ambulatory Visit: Payer: Medicaid Other

## 2022-02-15 ENCOUNTER — Other Ambulatory Visit: Payer: Self-pay

## 2022-02-16 ENCOUNTER — Other Ambulatory Visit: Payer: Self-pay

## 2022-02-22 ENCOUNTER — Institutional Professional Consult (permissible substitution): Payer: Medicaid Other | Admitting: Clinical

## 2022-02-23 ENCOUNTER — Ambulatory Visit: Payer: Medicaid Other | Admitting: Neurology

## 2022-02-25 ENCOUNTER — Ambulatory Visit (INDEPENDENT_AMBULATORY_CARE_PROVIDER_SITE_OTHER): Payer: Medicaid Other

## 2022-02-25 ENCOUNTER — Telehealth: Payer: Self-pay

## 2022-02-25 ENCOUNTER — Encounter: Payer: Self-pay | Admitting: Internal Medicine

## 2022-02-25 ENCOUNTER — Other Ambulatory Visit: Payer: Self-pay

## 2022-02-25 ENCOUNTER — Other Ambulatory Visit (HOSPITAL_COMMUNITY): Payer: Self-pay

## 2022-02-25 ENCOUNTER — Ambulatory Visit: Payer: Medicaid Other | Admitting: Sports Medicine

## 2022-02-25 ENCOUNTER — Other Ambulatory Visit: Payer: Self-pay | Admitting: Pharmacist

## 2022-02-25 ENCOUNTER — Ambulatory Visit: Payer: Medicaid Other | Admitting: Internal Medicine

## 2022-02-25 DIAGNOSIS — B359 Dermatophytosis, unspecified: Secondary | ICD-10-CM

## 2022-02-25 DIAGNOSIS — I739 Peripheral vascular disease, unspecified: Secondary | ICD-10-CM

## 2022-02-25 DIAGNOSIS — L97421 Non-pressure chronic ulcer of left heel and midfoot limited to breakdown of skin: Secondary | ICD-10-CM

## 2022-02-25 DIAGNOSIS — B182 Chronic viral hepatitis C: Secondary | ICD-10-CM | POA: Diagnosis present

## 2022-02-25 DIAGNOSIS — M2042 Other hammer toe(s) (acquired), left foot: Secondary | ICD-10-CM | POA: Diagnosis not present

## 2022-02-25 DIAGNOSIS — M79672 Pain in left foot: Secondary | ICD-10-CM

## 2022-02-25 DIAGNOSIS — L84 Corns and callosities: Secondary | ICD-10-CM

## 2022-02-25 DIAGNOSIS — M79675 Pain in left toe(s): Secondary | ICD-10-CM

## 2022-02-25 MED ORDER — CLOTRIMAZOLE 1 % EX SOLN: 1.0000 "application " | Freq: Two times a day (BID) | CUTANEOUS | 5 refills | Status: DC

## 2022-02-25 MED ORDER — SOFOSBUVIR-VELPATASVIR 400-100 MG PO TABS
1.0000 | ORAL_TABLET | Freq: Every day | ORAL | 2 refills | Status: DC
  Filled 2022-02-25: qty 56, 56d supply, fill #0
  Filled 2022-02-26: qty 28, 28d supply, fill #0
  Filled 2022-03-18: qty 28, 28d supply, fill #1
  Filled 2022-04-13: qty 28, 28d supply, fill #2

## 2022-02-25 MED ORDER — MONTELUKAST SODIUM 10 MG PO TABS
ORAL_TABLET | ORAL | 3 refills | Status: DC
  Filled 2022-02-25: qty 30, 30d supply, fill #0
  Filled 2022-03-28: qty 30, 30d supply, fill #1

## 2022-02-25 MED ORDER — IPRATROPIUM-ALBUTEROL 0.5-2.5 (3) MG/3ML IN SOLN
RESPIRATORY_TRACT | 0 refills | Status: DC
  Filled 2022-02-25: qty 120, 30d supply, fill #0

## 2022-02-25 NOTE — Progress Notes (Signed)
In error - Epclusa rx already sent to [facility] by Dr. Orvan Falconer ?

## 2022-02-25 NOTE — Assessment & Plan Note (Signed)
She is ready to start therapy.  I will put her on Epclusa 1 daily she has met with our pharmacy staff and been given instructions about how to take it and adherence tips.  We have given her 2 pillboxes to use to organize all of her medications.  I asked her to set her cell phone alarm to remind her to take Epclusa each evening.  She will follow-up in 5 weeks. ?

## 2022-02-25 NOTE — Telephone Encounter (Signed)
[facility] Patient Advocate Encounter ?  ?Received notification from [facility] Medicaid that prior authorization for Eplusa is required. ?  ?PA submitted on 02/25/22 ?Key [id number] W ?Status is pending ?   ?[facility] will continue to follow. ? ? ?Clearance Coots, CPhT ?Specialty Pharmacy Patient Advocate ?[facility] ?Phone: [phone] ?Fax:  [phone]  ?

## 2022-02-25 NOTE — Telephone Encounter (Signed)
[facility] Patient Advocate Encounter ? ?Prior Authorization for Epclusa (Generic) has been approved.   ? ?PA# [id number] ?Effective dates: 02/25/22 through 05/20/22 ? ?Patients co-pay is $4.00.  ? ?Prescription can be filled at [facility] ? ?[facility] will continue to follow. ? ?Clearance Coots, CPhT ?Specialty Pharmacy Patient Advocate ?[facility] ?Phone: [phone] ?Fax:  [phone]  ?
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
## 2022-02-26 ENCOUNTER — Telehealth: Payer: Self-pay

## 2022-02-26 ENCOUNTER — Other Ambulatory Visit (HOSPITAL_COMMUNITY): Payer: Self-pay

## 2022-02-26 ENCOUNTER — Other Ambulatory Visit: Payer: Self-pay

## 2022-02-26 NOTE — Telephone Encounter (Signed)
Patient was contacted to counsel on Epclusa for HCV. Educated to take one tablet daily, with or without food, but taking with food may help reduce stomach upset. Reiterated the importance of adhering to regimen and showing up to future appointments in order to ensure resolution of infection. Patient aware this medicine may cause fatigue or headache. Per patient, she has not yet received her medication from [facility], but she did receive a notification that a prescription was ready for her. Requested patient contact [facility] if there are any issues obtaining medication.  ? ?Valeda Malm, Pharm.D. ?PGY-1 Pharmacy Resident ?02/26/2022 10:46 AM ?

## 2022-02-26 NOTE — Telephone Encounter (Signed)
Fleet Contras went ahead and counseled patient on Epclusa this morning (see telephone note). Thanks! Marchelle Folks

## 2022-03-01 ENCOUNTER — Other Ambulatory Visit: Payer: Self-pay

## 2022-03-01 ENCOUNTER — Telehealth: Payer: Self-pay

## 2022-03-01 ENCOUNTER — Telehealth: Payer: Self-pay | Admitting: *Deleted

## 2022-03-01 NOTE — Telephone Encounter (Signed)
Candace Howard w/ [facility] is requesting a prior authorization for Vas [place] ABIwith w/o ABI,VAS [place] lower extremity venous reflux. Please contact him for scheduling when [phone]. ?

## 2022-03-01 NOTE — Telephone Encounter (Signed)
[facility] Patient Advocate Encounter ? ?Patient's medications have been couriered to [facility] from [facility] and will be picked up 02/26/22. ? ?Harlis Champoux , CPhT ?Specialty Pharmacy Patient Advocate ?[facility] ?Phone: [phone] ?Fax:  [phone]  ?

## 2022-03-02 ENCOUNTER — Other Ambulatory Visit: Payer: Self-pay

## 2022-03-02 ENCOUNTER — Telehealth: Payer: Self-pay

## 2022-03-02 MED ORDER — ALBUTEROL SULFATE (2.5 MG/3ML) 0.083% IN NEBU
INHALATION_SOLUTION | RESPIRATORY_TRACT | 3 refills | Status: DC
  Filled 2022-03-02: qty 270, 23d supply, fill #0
  Filled 2022-12-31: qty 270, 23d supply, fill #1

## 2022-03-02 MED ORDER — BUDESONIDE-FORMOTEROL FUMARATE 160-4.5 MCG/ACT IN AERO
INHALATION_SPRAY | RESPIRATORY_TRACT | 3 refills | Status: DC
  Filled 2022-03-02: qty 10.2, 30d supply, fill #0
  Filled 2022-06-17: qty 10.2, 30d supply, fill #1
  Filled 2022-10-14: qty 10.2, 30d supply, fill #2
  Filled 2022-12-31: qty 10.2, 30d supply, fill #3

## 2022-03-02 MED ORDER — DOXYCYCLINE HYCLATE 100 MG PO CAPS
ORAL_CAPSULE | ORAL | 0 refills | Status: DC
  Filled 2022-03-02: qty 20, 10d supply, fill #0

## 2022-03-02 MED ORDER — PREDNISONE 20 MG PO TABS
ORAL_TABLET | ORAL | 0 refills | Status: DC
  Filled 2022-03-02: qty 10, 5d supply, fill #0

## 2022-03-02 NOTE — Telephone Encounter (Signed)
error 

## 2022-03-03 ENCOUNTER — Other Ambulatory Visit: Payer: Self-pay

## 2022-03-03 ENCOUNTER — Ambulatory Visit
Admission: RE | Admit: 2022-03-03 | Discharge: 2022-03-03 | Disposition: A | Discharge status: Home / Self Care | Payer: Medicaid Other | Source: Ambulatory Visit | Attending: Neurology | Admitting: Neurology

## 2022-03-03 ENCOUNTER — Other Ambulatory Visit: Payer: Self-pay | Admitting: Neurology

## 2022-03-03 DIAGNOSIS — R251 Tremor, unspecified: Secondary | ICD-10-CM | POA: Diagnosis not present

## 2022-03-04 ENCOUNTER — Other Ambulatory Visit: Payer: Self-pay

## 2022-03-04 ENCOUNTER — Telehealth (HOSPITAL_COMMUNITY): Payer: Self-pay | Admitting: Psychiatry

## 2022-03-04 NOTE — Telephone Encounter (Signed)
Patient changing psychologist and therapy services due to residency [facility]. Pt provided with resources. Pt signed ROI for records to be sent to new provider.   Pt will call with name of provider for [place] to send records.    Please bridge medications until she sees new psychiatrist.

## 2022-03-09 ENCOUNTER — Telehealth: Payer: Self-pay | Admitting: *Deleted

## 2022-03-09 NOTE — Telephone Encounter (Signed)
Spoke with patient gave MRI results . Pt expressed understanding and thanked me for calling  

## 2022-03-09 NOTE — Telephone Encounter (Signed)
Called pt & LVM with office number asking for call back.  

## 2022-03-09 NOTE — Telephone Encounter (Signed)
-----   Message from Saima Athar, MD sent at 03/08/2022 10:45 AM EDT ----- ?Please call patient and advise her that her recent brain MRI without contrast did not show any obvious cause for her tremors.  I originally ordered a brain MRI with contrast but apparently they were not able to access any veins.  At this juncture, we can have her follow-up in this clinic as needed. ?

## 2022-03-09 NOTE — Telephone Encounter (Signed)
-----   Message from Huston Foley, MD sent at 03/08/2022 10:45 AM EDT ----- ?Please call patient and advise her that her recent brain MRI without contrast did not show any obvious cause for her tremors.  I originally ordered a brain MRI with contrast but apparently they were not able to access any veins.  At this juncture, we can have her follow-up in this clinic as needed. ?

## 2022-03-15 ENCOUNTER — Other Ambulatory Visit (HOSPITAL_COMMUNITY): Payer: Self-pay

## 2022-03-16 ENCOUNTER — Other Ambulatory Visit: Payer: Self-pay

## 2022-03-16 ENCOUNTER — Ambulatory Visit (HOSPITAL_COMMUNITY): Payer: Self-pay | Admitting: Licensed Clinical Social Worker

## 2022-03-16 MED ORDER — OMEPRAZOLE 40 MG PO CPDR
DELAYED_RELEASE_CAPSULE | ORAL | 3 refills | Status: DC
  Filled 2022-03-16: qty 30, 30d supply, fill #0

## 2022-03-16 MED ORDER — FLUTICASONE PROPIONATE 50 MCG/ACT NA SUSP
NASAL | 3 refills | Status: DC
  Filled 2022-03-16: qty 16, 30d supply, fill #0

## 2022-03-16 MED ORDER — PROMETHAZINE-DM 6.25-15 MG/5ML PO SYRP
ORAL_SOLUTION | ORAL | 0 refills | Status: DC
  Filled 2022-03-16: qty 140, 14d supply, fill #0

## 2022-03-18 ENCOUNTER — Other Ambulatory Visit (HOSPITAL_COMMUNITY): Payer: Self-pay

## 2022-03-19 ENCOUNTER — Other Ambulatory Visit: Payer: Self-pay

## 2022-03-19 MED ORDER — PREDNISONE 10 MG PO TABS
ORAL_TABLET | ORAL | 0 refills | Status: DC
  Filled 2022-03-19: qty 30, 12d supply, fill #0

## 2022-03-22 ENCOUNTER — Other Ambulatory Visit (HOSPITAL_COMMUNITY): Payer: Self-pay

## 2022-03-23 ENCOUNTER — Telehealth: Payer: Self-pay | Admitting: *Deleted

## 2022-03-23 NOTE — Telephone Encounter (Signed)
This patient studies [id number] does not required a prior authorization, ref#[id number],contact: Ashley L. ?Contacted Para March w/ [facility] to schedule appointment. ?

## 2022-03-24 ENCOUNTER — Encounter (HOSPITAL_COMMUNITY): Payer: Self-pay | Admitting: Psychiatry

## 2022-03-24 ENCOUNTER — Telehealth (HOSPITAL_COMMUNITY): Payer: Self-pay | Admitting: Psychiatry

## 2022-03-29 ENCOUNTER — Ambulatory Visit (INDEPENDENT_AMBULATORY_CARE_PROVIDER_SITE_OTHER)
Admission: RE | Admit: 2022-03-29 | Discharge: 2022-03-29 | Disposition: A | Discharge status: Home / Self Care | Payer: Medicaid Other | Source: Ambulatory Visit | Attending: Sports Medicine | Admitting: Sports Medicine

## 2022-03-29 ENCOUNTER — Ambulatory Visit (HOSPITAL_COMMUNITY)
Admission: RE | Admit: 2022-03-29 | Discharge: 2022-03-29 | Disposition: A | Discharge status: Home / Self Care | Payer: Medicaid Other | Source: Ambulatory Visit | Attending: Sports Medicine | Admitting: Sports Medicine

## 2022-03-29 ENCOUNTER — Other Ambulatory Visit: Payer: Self-pay

## 2022-03-29 DIAGNOSIS — I739 Peripheral vascular disease, unspecified: Secondary | ICD-10-CM | POA: Insufficient documentation

## 2022-03-29 MED ORDER — IMIQUIMOD 5 % EX CREA
TOPICAL_CREAM | CUTANEOUS | 1 refills | Status: DC
  Filled 2022-03-29: qty 12, 28d supply, fill #0

## 2022-03-29 MED ORDER — FLUCONAZOLE 150 MG PO TABS
ORAL_TABLET | ORAL | 0 refills | Status: DC
  Filled 2022-03-29: qty 3, 9d supply, fill #0

## 2022-03-30 ENCOUNTER — Other Ambulatory Visit: Payer: Self-pay

## 2022-03-30 ENCOUNTER — Ambulatory Visit: Payer: Self-pay

## 2022-03-30 ENCOUNTER — Ambulatory Visit (HOSPITAL_COMMUNITY): Payer: Self-pay | Admitting: Licensed Clinical Social Worker

## 2022-03-30 NOTE — Telephone Encounter (Signed)
?  Chief Complaint: Difficulty breathing - followed by pulm. For recent pneumonia. ?Symptoms: Fatigue, Craving sugar, blurry vision, frequent urination, night sweats, hunger ?Frequency:  since prednisone was started for SOB ?Pertinent Negatives: Patient denies  ?Disposition: [] ED /[] Urgent Care (no appt availability in office) / [] Appointment(In office/virtual)/ []  [facility]/ [] Home Care/ [] Refused Recommended Disposition /[] [facility]/ [x]  Follow-up with PCP ?Additional Notes: Pt has been on a course of prednisone and antibiotics for pneumonia. Pt states that SOB is resolving. January of this year A1C was. Per pt A1C from HD yesterday was 7.2.  Pt has new pt appointment  on 04/27/2022, but would like to be seen earlier. She is a bit worried about elevated A1C. ? ? ? ?Reason for Disposition ? Nursing judgment ? ?Protocols used: No Guideline or Reference Available-[facility] ? ?

## 2022-03-30 NOTE — Telephone Encounter (Signed)
Called pt back - unable to get an earlier appt with [facility] - Scheduled with Maurene Capes for tomorrow morning.  ?  ?

## 2022-03-30 NOTE — Progress Notes (Signed)
Called and spoke with patient giving results/ dr Wynema Birch recommendations of the Vascular study,verbalized understanding and said that she will call back and let us know if she would like the recommendations to vascular specialist.

## 2022-03-31 ENCOUNTER — Ambulatory Visit: Payer: Medicaid Other | Admitting: Physician Assistant

## 2022-04-01 ENCOUNTER — Telehealth: Payer: Self-pay | Admitting: Sports Medicine

## 2022-04-01 ENCOUNTER — Other Ambulatory Visit: Payer: Self-pay | Admitting: Sports Medicine

## 2022-04-01 ENCOUNTER — Ambulatory Visit: Payer: Medicaid Other | Admitting: Internal Medicine

## 2022-04-01 ENCOUNTER — Ambulatory Visit (INDEPENDENT_AMBULATORY_CARE_PROVIDER_SITE_OTHER): Payer: Medicaid Other | Admitting: Physician Assistant

## 2022-04-01 ENCOUNTER — Other Ambulatory Visit: Payer: Self-pay

## 2022-04-01 ENCOUNTER — Encounter: Payer: Self-pay | Admitting: Physician Assistant

## 2022-04-01 ENCOUNTER — Encounter: Payer: Self-pay | Admitting: Internal Medicine

## 2022-04-01 VITALS — BP 133/79 | HR 88 | Temp 98.0°F | Resp 18 | Ht 64.0 in | Wt 161.0 lb

## 2022-04-01 DIAGNOSIS — Z716 Tobacco abuse counseling: Secondary | ICD-10-CM | POA: Diagnosis not present

## 2022-04-01 DIAGNOSIS — B182 Chronic viral hepatitis C: Secondary | ICD-10-CM

## 2022-04-01 DIAGNOSIS — I739 Peripheral vascular disease, unspecified: Secondary | ICD-10-CM

## 2022-04-01 DIAGNOSIS — E119 Type 2 diabetes mellitus without complications: Secondary | ICD-10-CM

## 2022-04-01 MED ORDER — ACCU-CHEK GUIDE VI STRP
1.0000 | ORAL_STRIP | Freq: Two times a day (BID) | 12 refills | Status: DC
  Filled 2022-04-01: qty 100, 50d supply, fill #0

## 2022-04-01 MED ORDER — ACCU-CHEK SOFTCLIX LANCETS MISC
12 refills | Status: AC
  Filled 2022-04-01: qty 100, 50d supply, fill #0
  Filled 2022-04-01: qty 100, fill #0

## 2022-04-01 MED ORDER — METFORMIN HCL ER 500 MG PO TB24
500.0000 mg | ORAL_TABLET | Freq: Every day | ORAL | 1 refills | Status: DC
  Filled 2022-04-01: qty 30, 30d supply, fill #0

## 2022-04-01 MED ORDER — ACCU-CHEK GUIDE W/DEVICE KIT
1.0000 [IU] | PACK | Freq: Every day | 0 refills | Status: DC
  Filled 2022-04-01: qty 1, 30d supply, fill #0
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
## 2022-04-01 NOTE — Telephone Encounter (Signed)
Pt states she called the vein and vascular office this morning and was told that the order was not in for her to make an appointment. Can someone check in to this for her? She did not get a name as to who she spoke with.  ? ?Please advise. ?

## 2022-04-01 NOTE — Progress Notes (Signed)
Patient has eaten today and taken medication today. ?Patient denies pain at this time. ?[facility]. ? ?

## 2022-04-01 NOTE — Progress Notes (Signed)
? ?Established Patient Office Visit ? ?Subjective:  ?Patient ID: Candace Howard, female    DOB: 1971/05/10  Age: 51 y.o. MRN: [id number] ? ?CC:  ?Chief Complaint  ?Patient presents with  ? Diabetes  ? ? ?HPI ?Candace Howard reports that she [facility] at the beginning of the week and was told that her A1c was 7.1.  States that she has not previously been treated for diabetes in the past.  States that she was placed on steroids for several weeks of the last month and is unsure if the elevation in A1c is due to that.  States that she has gained 10 pounds since becoming sober. ? ?States that she has been taking risperidone for the past year. ? ?States that she is working with her behavioral health provider and is currently using nicotine patch to work on stopping smoking. ? ?States that she was smoking 30 to 40 cigarettes a day, states that she is currently smoking around 10. ? ?States that [facility] with Dr. Joya Gaskins ? ? ?Past Medical History:  ?Diagnosis Date  ? Anemia   ? Chest pain   ? Depression   ? Difficulty breathing   ? Frequent headaches   ? IBS (irritable bowel syndrome)   ? Palpitations   ? Poor circulation   ? ? ?History reviewed. No pertinent surgical history. ? ?Family History  ?Problem Relation Age of Onset  ? Heart disease Father   ? Hypertension Father   ? Diabetes Father   ? Diabetes Paternal Grandmother   ? COPD Mother   ? Glaucoma Mother   ? ? ?Social History  ? ?Socioeconomic History  ? Marital status: Single  ?  Spouse name: Not on file  ? Number of children: Not on file  ? Years of education: Not on file  ? Highest education level: Not on file  ?Occupational History  ? Not on file  ?Tobacco Use  ? Smoking status: Every Day  ?  Packs/day: 1.00  ?  Years: 35.00  ?  Pack years: 35.00  ?  Types: Cigarettes  ? Smokeless tobacco: Never  ?Vaping Use  ? Vaping Use: Never used  ?Substance and  Sexual Activity  ? Alcohol use: Not Currently  ?  Comment: 2 months sober.  ? Drug use: Not Currently  ?  Comment: 2 months sober from crack cocaine.  ? Sexual activity: Not Currently  ?Other Topics Concern  ? Not on file  ?Social History Narrative  ? Not on file  ? ?Social Determinants of Health  ? ?Financial Resource Strain: High Risk  ? Difficulty of Paying Living Expenses: Hard  ?Food Insecurity: No Food Insecurity  ? Worried About Charity fundraiser in the Last Year: Never true  ? Ran Out of Food in the Last Year: Never true  ?Transportation Needs: No Transportation Needs  ? Lack of Transportation (Medical): No  ? Lack of Transportation (Non-Medical): No  ?Physical Activity: Inactive  ? Days of Exercise per Week: 0 days  ? Minutes of Exercise per Session: 0 min  ?Stress: Stress Concern Present  ? Feeling of Stress : Very much  ?Social Connections: Socially Isolated  ? Frequency of Communication with Friends and Family: More than three times a week  ? Frequency of Social Gatherings with Friends and Family: Three times a week  ? Attends Religious Services: Never  ? Active  Member of Clubs or Organizations: No  ? Attends Archivist Meetings: Never  ? Marital Status: Never married  ?Intimate Partner Violence: Not At Risk  ? Fear of Current or Ex-Partner: No  ? Emotionally Abused: No  ? Physically Abused: No  ? Sexually Abused: No  ? ? ?Outpatient Medications Prior to Visit  ?Medication Sig Dispense Refill  ? albuterol (ACCUNEB) 0.63 MG/3ML nebulizer solution Take 1 ampule by nebulization. Patient unsure of dose    ? albuterol (PROVENTIL) (2.5 MG/3ML) 0.083% nebulizer solution use 20ml (1 vial) every 6 hours as needed 360 mL 3  ? ALBUTEROL IN Inhale into the lungs.    ? budesonide-formoterol (SYMBICORT) 160-4.5 MCG/ACT inhaler Take 2 inhalations 2 times a day 10.2 g 3  ? budesonide-formoterol (SYMBICORT) 80-4.5 MCG/ACT inhaler 2 puffs daily.    ? clotrimazole (LOTRIMIN) 1 % external solution Apply 1  application. topically 2 (two) times daily. In between toes 60 mL 5  ? escitalopram (LEXAPRO) 10 MG tablet Take 1 tablet (10 mg total) by mouth daily. 30 tablet 3  ? fluticasone (FLONASE) 50 MCG/ACT nasal spray Take 1 spray each nostril 2 times a day 16 g 3  ? gabapentin (NEURONTIN) 400 MG capsule Take 1 capsule (400 mg total) by mouth 3 (three) times daily. 90 capsule 3  ? hydrOXYzine (ATARAX) 25 MG tablet Take 1 tablet (25 mg total) by mouth 3 (three) times daily as needed for anxiety. 90 tablet 3  ? imiquimod (ALDARA) 5 % cream Apply 1 application three times a week externally 12 each 1  ? ipratropium-albuterol (DUONEB) 0.5-2.5 (3) MG/3ML SOLN 2 inhaler 2 times a day 120 mL 0  ? montelukast (SINGULAIR) 10 MG tablet Take 1 tablet by mouth tablet once a day 30 tablet 3  ? nicotine (NICODERM CQ - DOSED IN MG/24 HOURS) 14 mg/24hr patch Place 1 patch (14 mg total) onto the skin daily. 28 patch 3  ? omeprazole (PRILOSEC) 40 MG capsule Take 1 capsule by mouth at bedtime 30 capsule 3  ? risperiDONE (RISPERDAL) 2 MG tablet Take 1 tablet (2 mg total) by mouth at bedtime. 30 tablet 3  ? Sofosbuvir-Velpatasvir (EPCLUSA) 400-100 MG TABS Take 1 tablet by mouth daily. 30 tablet 2  ? promethazine-dextromethorphan (PROMETHAZINE-DM) 6.25-15 MG/5ML syrup Take 5 milliliters by mouth 2 times a day as needed for coughing (Patient not taking: Reported on 04/01/2022) 140 mL 0  ? doxycycline (VIBRAMYCIN) 100 MG capsule Take 1 capsule by mouth 2 times a day (Patient not taking: Reported on 04/01/2022) 20 capsule 0  ? fluconazole (DIFLUCAN) 150 MG tablet Take 1 tablet by mouth every 72 hours 3 tablet 0  ? predniSONE (DELTASONE) 10 MG tablet Take as directed: 4 tablets for 3 days and then 3 tablets for 3 days and then 2 tablets for 3 days and then 1 tab for 3days, then stop (Patient not taking: Reported on 04/01/2022) 30 tablet 0  ? predniSONE (DELTASONE) 20 MG tablet Take 2 tablets by mouth once a day (Patient not taking: Reported on  04/01/2022) 10 tablet 0  ? ?No facility-administered medications prior to visit.  ? ? ?Allergies  ?Allergen Reactions  ? Hydrocodone   ?  Other reaction(s): Unknown/See Comments ?"Feel really high"  ? ? ?ROS ?Review of Systems  ?Constitutional: Negative.   ?HENT: Negative.    ?Eyes: Negative.   ?Respiratory:  Negative for shortness of breath.   ?Cardiovascular:  Negative for chest pain.  ?Gastrointestinal: Negative.   ?Endocrine: Negative.   ?  Genitourinary: Negative.   ?Musculoskeletal: Negative.   ?Skin: Negative.   ?Allergic/Immunologic: Negative.   ?Neurological: Negative.   ?Hematological: Negative.   ?Psychiatric/Behavioral: Negative.    ? ?  ?Objective:  ?  ?Physical Exam ?Vitals and nursing note reviewed.  ?Constitutional:   ?   Appearance: Normal appearance.  ?HENT:  ?   Head: Normocephalic and atraumatic.  ?   Right Ear: External ear normal.  ?   Left Ear: External ear normal.  ?   Nose: Nose normal.  ?   Mouth/Throat:  ?   Mouth: Mucous membranes are moist.  ?   Pharynx: Oropharynx is clear.  ?Eyes:  ?   Extraocular Movements: Extraocular movements intact.  ?   Conjunctiva/sclera: Conjunctivae normal.  ?   Pupils: Pupils are equal, round, and reactive to light.  ?Cardiovascular:  ?   Rate and Rhythm: Normal rate and regular rhythm.  ?   Pulses: Normal pulses.  ?   Heart sounds: Normal heart sounds.  ?Pulmonary:  ?   Effort: Pulmonary effort is normal.  ?   Breath sounds: Normal breath sounds.  ?Musculoskeletal:     ?   General: Normal range of motion.  ?   Cervical back: Normal range of motion and neck supple.  ?Skin: ?   General: Skin is warm and dry.  ?Neurological:  ?   General: No focal deficit present.  ?   Mental Status: She is alert and oriented to person, place, and time.  ?Psychiatric:     ?   Mood and Affect: Mood normal.     ?   Behavior: Behavior normal.     ?   Thought Content: Thought content normal.     ?   Judgment: Judgment normal.  ? ? ?BP 133/79 (BP Location: Left Arm, Patient Position:  Sitting, Cuff Size: Normal)   Pulse 88   Temp 98 ?F (36.7 ?C) (Oral)   Resp 18   Ht 5\' 4"  (1.626 m)   Wt 161 lb (73 kg)   LMP 03/15/2022   SpO2 96%   BMI 27.64 kg/m?  ?Wt Readings from Last 3 Encounters:  ?04/01/22 161

## 2022-04-01 NOTE — Patient Instructions (Signed)
You are going to start using metformin once daily in the morning with breakfast.  I encourage you to check your blood sugar levels at home, keep a written log and have available for all office visits. ? ?Please let us know if using else we can do for you ? ?Roney Jaffe, PA-C ?Physician Assistant ?[facility] ?[email or web address] ? ? ?Diabetes Mellitus and Standards of Medical Care ?Living with and managing diabetes (diabetes mellitus) can be complicated. Your diabetes treatment may be managed by a team of health care providers, including: ?A physician who specializes in diabetes (endocrinologist). You might also have visits with a nurse practitioner or physician assistant. ?Nurses. ?A registered dietitian. ?A certified diabetes care and education specialist. ?An exercise specialist. ?A pharmacist. ?An eye doctor. ?A foot specialist (podiatrist). ?A dental care provider. ?A primary care provider. ?A mental health care provider. ?How to manage your diabetes ?You can do many things to successfully manage your diabetes. Your health care providers will follow guidelines to help you get the best quality of care. Here are general guidelines for your diabetes management plan. Your health care providers may give you more specific instructions. ?Physical exams ?When you are diagnosed with diabetes, and each year after that, your health care provider will ask about your medical and family history. You will have a physical exam, which may include: ?Measuring your height, weight, and body mass index (BMI). ?Checking your blood pressure. This will be done at every routine medical visit. Your target blood pressure may vary depending on your medical conditions, your age, and other factors. ?A thyroid exam. ?A skin exam. ?Screening for nerve damage (peripheral neuropathy). This may include checking the pulse in your legs and feet and the level of sensation in your hands and  feet. ?A foot exam to inspect the structure and skin of your feet, including checking for cuts, bruises, redness, blisters, sores, or other problems. ?Screening for blood vessel (vascular) problems. This may include checking the pulse in your legs and feet and checking your temperature. ?Blood tests ?Depending on your treatment plan and your personal needs, you may have the following tests: ?Hemoglobin A1C (HbA1C). This test provides information about blood sugar (glucose) control over the previous 2-3 months. It is used to adjust your treatment plan, if needed. This test will be done: ?At least 2 times a year, if you are meeting your treatment goals. ?4 times a year, if you are not meeting your treatment goals or if your goals have changed. ?Lipid testing, including total cholesterol, LDL and HDL cholesterol, and triglyceride levels. ?The goal for LDL is less than 100 mg/dL (5.5 mmol/L). If you are at high risk for complications, the goal is less than 70 mg/dL (3.9 mmol/L). ?The goal for HDL is 40 mg/dL (2.2 mmol/L) or higher for men, and 50 mg/dL (2.8 mmol/L) or higher for women. An HDL cholesterol of 60 mg/dL (3.3 mmol/L) or higher gives some protection against heart disease. ?The goal for triglycerides is less than 150 mg/dL (8.3 mmol/L). ?Liver function tests. ?Kidney function tests. ?Thyroid function tests. ? ?Dental and eye exams ? ?Visit your dentist two times a year. ?If you have type 1 diabetes, your health care provider may recommend an eye exam within 5 years after you are diagnosed, and then once a year after your first exam. ?For children with type 1 diabetes, the health care provider may recommend an eye exam when your child is age 8 or older and has had diabetes for  3-5 years. After the first exam, your child should get an eye exam once a year. ?If you have type 2 diabetes, your health care provider may recommend an eye exam as soon as you are diagnosed, and then every 1-2 years after your first  exam. ?Immunizations ?A yearly flu (influenza) vaccine is recommended annually for everyone 6 months or older. This is especially important if you have diabetes. ?The pneumonia (pneumococcal) vaccine is recommended for everyone 2 years or older who has diabetes. If you are age 4 or older, you may get the pneumonia vaccine as a series of two separate shots. ?The hepatitis B vaccine is recommended for adults shortly after being diagnosed with diabetes. ?Adults and children with diabetes should receive all other vaccines according to age-specific recommendations from the Centers for Disease Control and Prevention (CDC). ?Mental and emotional health ?Screening for symptoms of eating disorders, anxiety, and depression is recommended at the time of diagnosis and after as needed. If your screening shows that you have symptoms, you may need more evaluation. You may work with a mental health care provider. ?Follow these instructions at home: ?Treatment plan ?You will monitor your blood glucose levels and may give yourself insulin. Your treatment plan will be reviewed at every medical visit. You and your health care provider will discuss: ?How you are taking your medicines, including insulin. ?Any side effects you have. ?Your blood glucose level target goals. ?How often you monitor your blood glucose level. ?Lifestyle habits, such as activity level and tobacco, alcohol, and substance use. ?Education ?Your health care provider will assess how well you are monitoring your blood glucose levels and whether you are taking your insulin and medicines correctly. He or she may refer you to: ?A certified diabetes care and education specialist to manage your diabetes throughout your life, starting at diagnosis. ?A registered dietitian who can create and review your personal nutrition plan. ?An exercise specialist who can discuss your activity level and exercise plan. ?General instructions ?Take over-the-counter and prescription  medicines only as told by your health care provider. ?Keep all follow-up visits. This is important. ?Where to find support ?There are many diabetes support networks, including: ?American Diabetes Association (ADA): diabetes.org ?Defeat Diabetes Foundation: defeatdiabetes.org ?Where to find more information ?American Diabetes Association (ADA): www.diabetes.org ?Association of Diabetes Care & Education Specialists (ADCES): diabeteseducator.org ?International Diabetes Federation (IDF): http://hill.biz/ ?Summary ?Managing diabetes (diabetes mellitus) can be complicated. Your diabetes treatment may be managed by a team of health care providers. ?Your health care providers follow guidelines to help you get the best quality care. ?You should have physical exams, blood tests, blood pressure monitoring, immunizations, and screening tests regularly. Stay updated on how to manage your diabetes. ?Your health care providers may also give you more specific instructions based on your individual health. ?This information is not intended to replace advice given to you by your health care provider. Make sure you discuss any questions you have with your health care provider. ?Document Revised: 06/12/2020 Document Reviewed: 06/12/2020 ?Elsevier Patient Education ? 2022 Elsevier Inc. ? ?

## 2022-04-01 NOTE — Assessment & Plan Note (Signed)
She has chronic hepatitis C, genotype 1a with a fibrosis score of F1.  She is doing very well with her first month of Epclusa.  I will repeat her hepatitis C viral load today and see her back in 1 month.  We are planning on 3 months of therapy with Epclusa. ?

## 2022-04-01 NOTE — Telephone Encounter (Signed)
Pt called asking to schedule an appt with vein and vascular that she had received a message.  ? ?Upon checking she was referred and she would need to call them to schedule an appt. I gave pt number to vein and vascular. ?

## 2022-04-01 NOTE — Progress Notes (Signed)
Referral placed for vein and vascular ?

## 2022-04-02 NOTE — Telephone Encounter (Signed)
Called and spoke to patient. She is aware of referral to vein specialist and will wait for their call In 1-2 weeks to setup appointment. ?

## 2022-04-03 DIAGNOSIS — E119 Type 2 diabetes mellitus without complications: Secondary | ICD-10-CM | POA: Insufficient documentation

## 2022-04-04 LAB — COMPREHENSIVE METABOLIC PANEL
AG Ratio: 1.4 (calc) (ref 1.0–2.5)
ALT: 14 U/L (ref 6–29)
AST: 13 U/L (ref 10–35)
Albumin: 4 g/dL (ref 3.6–5.1)
Alkaline phosphatase (APISO): 79 U/L (ref 31–125)
BUN: 12 mg/dL (ref 7–25)
CO2: 29 mmol/L (ref 20–32)
Calcium: 8.6 mg/dL (ref 8.6–10.2)
Chloride: 100 mmol/L (ref 98–110)
Creat: 0.77 mg/dL (ref 0.50–0.99)
Globulin: 2.9 g/dL (calc) (ref 1.9–3.7)
Glucose, Bld: 195 mg/dL — ABNORMAL HIGH (ref 65–99)
Potassium: 4.1 mmol/L (ref 3.5–5.3)
Sodium: 137 mmol/L (ref 135–146)
Total Bilirubin: 0.4 mg/dL (ref 0.2–1.2)
Total Protein: 6.9 g/dL (ref 6.1–8.1)

## 2022-04-04 LAB — HEPATITIS C RNA QUANTITATIVE
HCV Quantitative Log: <1.18 log IU/mL
HCV RNA, PCR, QN: <15 IU/mL

## 2022-04-04 LAB — HEPATITIS B SURFACE ANTIBODY,QUALITATIVE: Hep B S Ab: REACTIVE — AB

## 2022-04-13 ENCOUNTER — Other Ambulatory Visit (HOSPITAL_COMMUNITY): Payer: Self-pay

## 2022-04-15 ENCOUNTER — Other Ambulatory Visit (HOSPITAL_COMMUNITY): Payer: Self-pay

## 2022-04-27 ENCOUNTER — Ambulatory Visit: Payer: Medicaid Other | Attending: Critical Care Medicine | Admitting: Critical Care Medicine

## 2022-04-27 ENCOUNTER — Encounter: Payer: Self-pay | Admitting: Critical Care Medicine

## 2022-04-27 ENCOUNTER — Other Ambulatory Visit: Payer: Self-pay

## 2022-04-27 VITALS — BP 128/89 | HR 69 | Wt 162.0 lb

## 2022-04-27 DIAGNOSIS — Z79899 Other long term (current) drug therapy: Secondary | ICD-10-CM | POA: Insufficient documentation

## 2022-04-27 DIAGNOSIS — J441 Chronic obstructive pulmonary disease with (acute) exacerbation: Secondary | ICD-10-CM | POA: Insufficient documentation

## 2022-04-27 DIAGNOSIS — F17210 Nicotine dependence, cigarettes, uncomplicated: Secondary | ICD-10-CM

## 2022-04-27 DIAGNOSIS — Z87898 Personal history of other specified conditions: Secondary | ICD-10-CM

## 2022-04-27 DIAGNOSIS — Z23 Encounter for immunization: Secondary | ICD-10-CM | POA: Diagnosis not present

## 2022-04-27 DIAGNOSIS — Z7951 Long term (current) use of inhaled steroids: Secondary | ICD-10-CM | POA: Diagnosis not present

## 2022-04-27 DIAGNOSIS — Z1211 Encounter for screening for malignant neoplasm of colon: Secondary | ICD-10-CM

## 2022-04-27 DIAGNOSIS — F1021 Alcohol dependence, in remission: Secondary | ICD-10-CM | POA: Insufficient documentation

## 2022-04-27 DIAGNOSIS — F411 Generalized anxiety disorder: Secondary | ICD-10-CM | POA: Diagnosis not present

## 2022-04-27 DIAGNOSIS — T3165 Burns involving 60-69% of body surface with 50-59% third degree burns: Secondary | ICD-10-CM

## 2022-04-27 DIAGNOSIS — Z122 Encounter for screening for malignant neoplasm of respiratory organs: Secondary | ICD-10-CM | POA: Insufficient documentation

## 2022-04-27 DIAGNOSIS — F333 Major depressive disorder, recurrent, severe with psychotic symptoms: Secondary | ICD-10-CM

## 2022-04-27 DIAGNOSIS — B182 Chronic viral hepatitis C: Secondary | ICD-10-CM | POA: Diagnosis not present

## 2022-04-27 DIAGNOSIS — F3160 Bipolar disorder, current episode mixed, unspecified: Secondary | ICD-10-CM

## 2022-04-27 DIAGNOSIS — E119 Type 2 diabetes mellitus without complications: Secondary | ICD-10-CM | POA: Diagnosis present

## 2022-04-27 DIAGNOSIS — Z716 Tobacco abuse counseling: Secondary | ICD-10-CM | POA: Insufficient documentation

## 2022-04-27 DIAGNOSIS — K860 Alcohol-induced chronic pancreatitis: Secondary | ICD-10-CM | POA: Diagnosis not present

## 2022-04-27 DIAGNOSIS — K861 Other chronic pancreatitis: Secondary | ICD-10-CM | POA: Diagnosis not present

## 2022-04-27 DIAGNOSIS — G629 Polyneuropathy, unspecified: Secondary | ICD-10-CM

## 2022-04-27 DIAGNOSIS — F17200 Nicotine dependence, unspecified, uncomplicated: Secondary | ICD-10-CM

## 2022-04-27 DIAGNOSIS — F1421 Cocaine dependence, in remission: Secondary | ICD-10-CM | POA: Diagnosis not present

## 2022-04-27 DIAGNOSIS — Z7984 Long term (current) use of oral hypoglycemic drugs: Secondary | ICD-10-CM | POA: Insufficient documentation

## 2022-04-27 DIAGNOSIS — E785 Hyperlipidemia, unspecified: Secondary | ICD-10-CM

## 2022-04-27 DIAGNOSIS — F314 Bipolar disorder, current episode depressed, severe, without psychotic features: Secondary | ICD-10-CM | POA: Insufficient documentation

## 2022-04-27 LAB — GLUCOSE, POCT (MANUAL RESULT ENTRY): POC Glucose: 109 mg/dl — AB (ref 70–99)

## 2022-04-27 MED ORDER — ESCITALOPRAM OXALATE 10 MG PO TABS
10.0000 mg | ORAL_TABLET | Freq: Every day | ORAL | 3 refills | Status: DC
  Filled 2022-04-27: qty 30, 30d supply, fill #0
  Filled 2022-06-17: qty 30, 30d supply, fill #1
  Filled 2022-07-20: qty 30, 30d supply, fill #2
  Filled 2022-08-26: qty 30, 30d supply, fill #3

## 2022-04-27 MED ORDER — NICOTINE POLACRILEX 4 MG MT LOZG
LOZENGE | OROMUCOSAL | 4 refills | Status: DC
  Filled 2022-04-27: qty 72, 24d supply, fill #0

## 2022-04-27 MED ORDER — MONTELUKAST SODIUM 10 MG PO TABS
ORAL_TABLET | ORAL | 3 refills | Status: DC
  Filled 2022-04-27: qty 30, 30d supply, fill #0
  Filled 2022-06-17: qty 30, 30d supply, fill #1
  Filled 2022-07-20: qty 30, 30d supply, fill #2
  Filled 2022-08-15: qty 30, 30d supply, fill #3

## 2022-04-27 MED ORDER — HYDROXYZINE HCL 25 MG PO TABS
25.0000 mg | ORAL_TABLET | Freq: Three times a day (TID) | ORAL | 3 refills | Status: DC | PRN
  Filled 2022-04-27: qty 90, 30d supply, fill #0

## 2022-04-27 MED ORDER — OMEPRAZOLE 40 MG PO CPDR
DELAYED_RELEASE_CAPSULE | ORAL | 3 refills | Status: DC
  Filled 2022-04-27: qty 30, 30d supply, fill #0
  Filled 2022-06-17: qty 30, 30d supply, fill #1
  Filled 2022-07-20: qty 30, 30d supply, fill #2
  Filled 2022-09-24: qty 30, 30d supply, fill #3

## 2022-04-27 MED ORDER — IPRATROPIUM-ALBUTEROL 0.5-2.5 (3) MG/3ML IN SOLN
3.0000 mL | Freq: Four times a day (QID) | RESPIRATORY_TRACT | 0 refills | Status: DC | PRN
  Filled 2022-04-27: qty 90, 8d supply, fill #0

## 2022-04-27 MED ORDER — FLUTICASONE PROPIONATE 50 MCG/ACT NA SUSP
NASAL | 3 refills | Status: DC
Start: 2022-04-27 — End: 2023-02-02
  Filled 2022-04-27: qty 16, 25d supply, fill #0

## 2022-04-27 MED ORDER — METFORMIN HCL ER 500 MG PO TB24
500.0000 mg | ORAL_TABLET | Freq: Every day | ORAL | 1 refills | Status: DC
  Filled 2022-04-27: qty 90, 90d supply, fill #0
  Filled 2022-08-26: qty 90, 90d supply, fill #1
  Filled 2022-09-03: qty 90, 90d supply, fill #0

## 2022-04-27 MED ORDER — GABAPENTIN 400 MG PO CAPS
400.0000 mg | ORAL_CAPSULE | Freq: Three times a day (TID) | ORAL | 3 refills | Status: DC
  Filled 2022-04-27: qty 90, 30d supply, fill #0
  Filled 2022-07-06: qty 90, 30d supply, fill #1
  Filled 2022-09-03: qty 90, 30d supply, fill #0
  Filled 2022-11-02: qty 90, 30d supply, fill #1
  Filled 2022-12-29: qty 90, 30d supply, fill #2

## 2022-04-27 MED ORDER — RISPERIDONE 2 MG PO TABS
2.0000 mg | ORAL_TABLET | Freq: Every day | ORAL | 3 refills | Status: DC
  Filled 2022-04-27: qty 30, 30d supply, fill #0
  Filled 2022-06-09 – 2022-06-17 (×2): qty 30, 30d supply, fill #1
  Filled 2022-07-20: qty 30, 30d supply, fill #2
  Filled 2022-08-15: qty 30, 30d supply, fill #3

## 2022-04-27 MED ORDER — NICOTINE 21 MG/24HR TD PT24
21.0000 mg | MEDICATED_PATCH | Freq: Every day | TRANSDERMAL | 0 refills | Status: DC
  Filled 2022-04-27: qty 28, 28d supply, fill #0

## 2022-04-27 NOTE — Assessment & Plan Note (Signed)
Stable on risperidone continue same we will refill ?

## 2022-04-27 NOTE — Assessment & Plan Note (Signed)
[facility] ?

## 2022-04-27 NOTE — Assessment & Plan Note (Signed)
? ? ??   Current smoking consumption amount: 1 pack a day ? ?? Dicsussion on advise to quit smoking and smoking impacts: Lung impacts ? ?? Patient's willingness to quit: Wants to quit ? ?? Methods to quit smoking discussed: Behavioral modification and nicotine replacement ? ?? Medication management of smoking session drugs discussed: Using both nicotine patch and gum ? ?? Resources provided:  AVS  ? ?? Setting quit date not established ? ?? Follow-up arranged 2 months ? ? ?Time spent counseling the patient: 5 minutes ? ? ?

## 2022-04-27 NOTE — Assessment & Plan Note (Signed)
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 ?Smoking cessation is key ? ?Patient has a pulmonologist I have asked the patient to inquire about the possibility of using combination LAMA LABA and an inhaled steroid ?

## 2022-04-27 NOTE — Assessment & Plan Note (Signed)
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 ?We will continue gabapentin as prescribed ?
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
## 2022-04-27 NOTE — Assessment & Plan Note (Signed)
High risk for lung cancer will order low-dose CT scanning of lung ?

## 2022-04-27 NOTE — Assessment & Plan Note (Signed)
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

## 2022-04-27 NOTE — Progress Notes (Signed)
? ?New Patient Office Visit ? ?Subjective   ? ?Patient ID: Candace Howard, female    DOB: [birth year]  Age: 51 y.o. MRN: [id number] ? ?CC:  ?Chief Complaint  ?Patient presents with  ? Establish Care  ? ? 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  She also has active tobacco use history of asthma.  Patient also has history of diabetes.  Patient was seen previously by our PA in our mobile medicine unit as noted below. ?Saw [facility] 03/2022: ? ?Candace Howard reports that she [facility] seen [facility] at the beginning of the week and was told that her A1c was 7.1.  States that she has not previously been treated for diabetes in the past.  States that she was placed on steroids for several weeks of the last month and is unsure if the elevation in A1c is due to that.  States that she has gained 10 pounds since becoming sober. ? ?States that she has been taking risperidone for the past year. ? ?States that she is working with her behavioral health provider and is currently using nicotine patch to work on stopping smoking. ? ?States that she was smoking 30 to 40 cigarettes a day, states that she is currently smoking around 10. ? ?States that she does [facility] with Dr. Joya Gaskins ?  ?Patient now here in our clinic to establish care.  She does live in [place] in [place] and [place].  She does have bipolar and severe depression.  Her PHQ-9 is 3 and GAD-7 is 3 today. ? ?Patient does have type 2 diabetes is on the metformin.  Blood sugars have been reasonably controlled.  She was smoking 2 packs a day of cigarettes now is down to 1 pack a day.  She still has a dry cough productive cough.  She follows with a pulmonary doctor in [place].  She is due a pneumonia vaccine and tetanus vaccine she will receive both.  She is  on the  risperidone and Lexapro is taking both. ? ?The patient had a colonoscopy and a Pap smear recently done. ?Outpatient Encounter Medications as of 04/27/2022  ?Medication Sig  ? Accu-Chek Softclix Lancets lancets Use as instructed  ? albuterol (PROVENTIL) (2.5 MG/3ML) 0.083% nebulizer solution use 5m (1 vial) every 6 hours as needed  ? ALBUTEROL IN Inhale into the lungs.  ? Blood Glucose Monitoring Suppl (ACCU-CHEK GUIDE) w/Device KIT use as directed once daily  ? budesonide-formoterol (SYMBICORT) 160-4.5 MCG/ACT inhaler Take 2 inhalations 2 times a day  ? clotrimazole (LOTRIMIN) 1 % external solution Apply 1 application. topically 2 (two) times daily. In between toes  ? glucose blood (ACCU-CHEK GUIDE) test strip use  2 (two) times daily as instructed  ? imiquimod (ALDARA) 5 % cream Apply 1 application three times a week externally  ? nicotine polacrilex (NICORETTE MINI) 4 MG lozenge Use three times a day to quit smoking  ? Sofosbuvir-Velpatasvir (EPCLUSA) 400-100 MG TABS Take 1 tablet by mouth daily.  ? [DISCONTINUED] albuterol (ACCUNEB) 0.63 MG/3ML nebulizer solution Take 1 ampule by nebulization. Patient unsure of dose  ? [DISCONTINUED] budesonide-formoterol (SYMBICORT) 80-4.5 MCG/ACT inhaler 2 puffs daily.  ? [DISCONTINUED] escitalopram (LEXAPRO) 10 MG tablet Take 1 tablet (10 mg total) by mouth daily.  ? [DISCONTINUED] fluticasone (FLONASE) 50 MCG/ACT  nasal spray Take 1 spray each nostril 2 times a day  ? [DISCONTINUED] gabapentin (NEURONTIN) 400 MG capsule Take 1 capsule (400 mg total) by mouth 3 (three) times daily.  ? [DISCONTINUED] hydrOXYzine (ATARAX) 25 MG tablet Take 1 tablet (25 mg total) by mouth 3 (three) times daily as needed for anxiety.  ? [DISCONTINUED] ipratropium-albuterol (DUONEB) 0.5-2.5 (3) MG/3ML SOLN 2 inhaler 2 times a day  ? [DISCONTINUED] metFORMIN (GLUCOPHAGE-XR) 500 MG 24 hr tablet Take 1 tablet (500 mg total) by mouth daily with breakfast.  ? [DISCONTINUED] montelukast (SINGULAIR)  10 MG tablet Take 1 tablet by mouth tablet once a day  ? [DISCONTINUED] omeprazole (PRILOSEC) 40 MG capsule Take 1 capsule by mouth at bedtime  ? [DISCONTINUED] risperiDONE (RISPERDAL) 2 MG tablet Take 1 tablet (2 mg total) by mouth at bedtime.  ? escitalopram (LEXAPRO) 10 MG tablet Take 1 tablet (10 mg total) by mouth daily.  ? fluticasone (FLONASE) 50 MCG/ACT nasal spray Insert 1 spray into each nostril 2 times a day  ? gabapentin (NEURONTIN) 400 MG capsule Take 1 capsule (400 mg total) by mouth 3 (three) times daily.  ? hydrOXYzine (ATARAX) 25 MG tablet Take 1 tablet (25 mg total) by mouth 3 (three) times daily as needed for anxiety.  ? ipratropium-albuterol (DUONEB) 0.5-2.5 (3) MG/3ML SOLN Take 3 mLs by nebulization every 6 (six) hours as needed.  ? metFORMIN (GLUCOPHAGE-XR) 500 MG 24 hr tablet Take 1 tablet (500 mg total) by mouth daily with breakfast.  ? montelukast (SINGULAIR) 10 MG tablet Take 1 tablet by mouth tablet once a day  ? nicotine (NICODERM CQ - DOSED IN MG/24 HOURS) 21 mg/24hr patch Place 1 patch (21 mg total) onto the skin daily.  ? omeprazole (PRILOSEC) 40 MG capsule Take 1 capsule by mouth at bedtime  ? risperiDONE (RISPERDAL) 2 MG tablet Take 1 tablet (2 mg total) by mouth at bedtime.  ? [DISCONTINUED] nicotine (NICODERM CQ - DOSED IN MG/24 HOURS) 14 mg/24hr patch Place 1 patch (14 mg total) onto the skin daily. (Patient not taking: Reported on 04/27/2022)  ? [DISCONTINUED] promethazine-dextromethorphan (PROMETHAZINE-DM) 6.25-15 MG/5ML syrup Take 5 milliliters by mouth 2 times a day as needed for coughing (Patient not taking: Reported on 04/27/2022)  ? ?No facility-administered encounter medications on file as of 04/27/2022.  ? ? ?Past Medical History:  ?Diagnosis Date  ? Anemia   ? Chest pain   ? Depression   ? Difficulty breathing   ? Frequent headaches   ? IBS (irritable bowel syndrome)   ? Palpitations   ? Poor circulation   ? ? ?History reviewed. No pertinent surgical history. ? ?Family History   ?Problem Relation Age of Onset  ? Heart disease Father   ? Hypertension Father   ? Diabetes Father   ? Diabetes Paternal Grandmother   ? COPD Mother   ? Glaucoma Mother   ? ? ?Social History  ? ?Socioeconomic History  ? Marital status: Single  ?  Spouse name: Not on file  ? Number of children: Not on file  ? Years of education: Not on file  ? Highest education level: Not on file  ?Occupational History  ? Not on file  ?Tobacco Use  ? Smoking status: Every Day  ?  Packs/day: 1.00  ?  Years: 35.00  ?  Pack years: 35.00  ?  Types: Cigarettes  ? Smokeless tobacco: Never  ?Vaping Use  ? Vaping Use: Never used  ?Substance and Sexual Activity  ? Alcohol  use: Not Currently  ?  Comment: 2 months sober.  ? Drug use: Not Currently  ?  Comment: 2 months sober from crack cocaine.  ? Sexual activity: Not Currently  ?Other Topics Concern  ? Not on file  ?Social History Narrative  ? Not on file  ? ?Social Determinants of Health  ? ?Financial Resource Strain: High Risk  ? Difficulty of Paying Living Expenses: Hard  ?Food Insecurity: No Food Insecurity  ? Worried About Charity fundraiser in the Last Year: Never true  ? Ran Out of Food in the Last Year: Never true  ?Transportation Needs: No Transportation Needs  ? Lack of Transportation (Medical): No  ? Lack of Transportation (Non-Medical): No  ?Physical Activity: Inactive  ? Days of Exercise per Week: 0 days  ? Minutes of Exercise per Session: 0 min  ?Stress: Stress Concern Present  ? Feeling of Stress : Very much  ?Social Connections: Socially Isolated  ? Frequency of Communication with Friends and Family: More than three times a week  ? Frequency of Social Gatherings with Friends and Family: Three times a week  ? Attends Religious Services: Never  ? Active Member of Clubs or Organizations: No  ? Attends Archivist Meetings: Never  ? Marital Status: Never married  ?Intimate Partner Violence: Not At Risk  ? Fear of Current or Ex-Partner: No  ? Emotionally Abused: No  ?  Physically Abused: No  ? Sexually Abused: No  ? ? ?Review of Systems  ?Constitutional:  Negative for chills, diaphoresis, fever, malaise/fatigue and weight loss.  ?HENT:  Positive for congestion and sor

## 2022-04-27 NOTE — Assessment & Plan Note (Signed)
Improved on Lexapro we will refill same ?

## 2022-04-27 NOTE — Assessment & Plan Note (Signed)
Continue with metformin at current dose level ?

## 2022-04-27 NOTE — Assessment & Plan Note (Signed)
Currently in 12-step program and is abstinent ?

## 2022-04-27 NOTE — Patient Instructions (Signed)
Recommend you see your lung doctor to get on a medication called Trelegy in place of Symbicort ? ?Focus on smoking cessation use nicotine patch 21 mg and using nicotine lozenge 4 mg 3 times daily in addition ? ?Blood counts and urine sample obtained today for protein ? ?Prevnar 20 vaccine and tetanus vaccine given ? ?Low-dose CT scan lung cancer screening will be obtained ? ?Refills on all medications sent to our pharmacy downstairs ? ?Return to Dr. Delford Field in 3 months ?

## 2022-04-27 NOTE — Assessment & Plan Note (Signed)
Needs to start statin therapy we will order same a atorvastatin 10 mg daily ?

## 2022-04-28 ENCOUNTER — Ambulatory Visit: Payer: Medicaid Other | Admitting: Internal Medicine

## 2022-04-28 ENCOUNTER — Encounter: Payer: Self-pay | Admitting: Internal Medicine

## 2022-04-28 ENCOUNTER — Other Ambulatory Visit: Payer: Self-pay

## 2022-04-28 DIAGNOSIS — B182 Chronic viral hepatitis C: Secondary | ICD-10-CM

## 2022-04-28 LAB — CBC WITH DIFFERENTIAL/PLATELET
Basophils Absolute: 0 10*3/uL (ref 0.0–0.2)
Basos: 0 %
EOS (ABSOLUTE): 0 10*3/uL (ref 0.0–0.4)
Eos: 0 %
Hematocrit: 48.7 % — ABNORMAL HIGH (ref 34.0–46.6)
Hemoglobin: 16.6 g/dL — ABNORMAL HIGH (ref 11.1–15.9)
Immature Grans (Abs): 0 10*3/uL (ref 0.0–0.1)
Immature Granulocytes: 0 %
Lymphocytes Absolute: 2.6 10*3/uL (ref 0.7–3.1)
Lymphs: 29 %
MCH: 31.8 pg (ref 26.6–33.0)
MCHC: 34.1 g/dL (ref 31.5–35.7)
MCV: 93 fL (ref 79–97)
Monocytes Absolute: 0.5 10*3/uL (ref 0.1–0.9)
Monocytes: 6 %
Neutrophils Absolute: 5.9 10*3/uL (ref 1.4–7.0)
Neutrophils: 65 %
Platelets: 328 10*3/uL (ref 150–450)
RBC: 5.22 x10E6/uL (ref 3.77–5.28)
RDW: 12.2 % (ref 11.7–15.4)
WBC: 9.2 10*3/uL (ref 3.4–10.8)

## 2022-04-28 LAB — MICROALBUMIN / CREATININE URINE RATIO
Creatinine, Urine: 12.4 mg/dL
Microalb/Creat Ratio: <24 mg/g creat (ref 0–29)
Microalbumin, Urine: <3 ug/mL

## 2022-04-28 NOTE — Assessment & Plan Note (Signed)
Her hepatitis C viral load quickly suppressed to undetectable levels after starting Epclusa 2 months ago.  She has not been missing any doses.  She will get a repeat viral load today and follow-up in 1 month. ?
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
## 2022-04-29 ENCOUNTER — Telehealth: Payer: Self-pay

## 2022-04-29 NOTE — Telephone Encounter (Signed)
Contacted pt to to go over lab results pt is aware and doesn't have any questions or concerns  ?

## 2022-05-03 LAB — HEPATITIS C RNA QUANTITATIVE
HCV Quantitative Log: <1.18 log IU/mL
HCV RNA, PCR, QN: <15 IU/mL

## 2022-05-10 ENCOUNTER — Other Ambulatory Visit (HOSPITAL_COMMUNITY): Payer: Self-pay

## 2022-05-20 ENCOUNTER — Ambulatory Visit
Admission: RE | Admit: 2022-05-20 | Discharge: 2022-05-20 | Disposition: A | Discharge status: Home / Self Care | Payer: Medicaid Other | Source: Ambulatory Visit | Attending: Critical Care Medicine | Admitting: Critical Care Medicine

## 2022-05-20 DIAGNOSIS — Z122 Encounter for screening for malignant neoplasm of respiratory organs: Secondary | ICD-10-CM

## 2022-05-25 ENCOUNTER — Telehealth: Payer: Self-pay

## 2022-05-25 NOTE — Telephone Encounter (Signed)
Pt was called and is aware of results, DOB was confirmed.  ?

## 2022-05-25 NOTE — Telephone Encounter (Signed)
-----   Message from Storm Frisk, MD sent at 05/24/2022  5:29 AM EDT ----- CT Chest NEGATIVE  no lung nodules  recommend repeat in one year and focus on quitting smoking

## 2022-05-26 ENCOUNTER — Ambulatory Visit (INDEPENDENT_AMBULATORY_CARE_PROVIDER_SITE_OTHER): Payer: Medicaid Other | Admitting: Internal Medicine

## 2022-05-26 ENCOUNTER — Encounter: Payer: Self-pay | Admitting: Internal Medicine

## 2022-05-26 ENCOUNTER — Other Ambulatory Visit: Payer: Self-pay

## 2022-05-26 DIAGNOSIS — B182 Chronic viral hepatitis C: Secondary | ICD-10-CM | POA: Diagnosis present

## 2022-05-26 NOTE — Assessment & Plan Note (Signed)
Her adherence with Dorita Fray was excellent and she is well on her way to cure of her infection.  I will repeat her hepatitis C viral load today and bring her back in 3 months for test of cure.
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
## 2022-05-29 LAB — HEPATITIS C RNA QUANTITATIVE
HCV Quantitative Log: <1.18 log IU/mL
HCV RNA, PCR, QN: <15 IU/mL

## 2022-06-08 ENCOUNTER — Encounter: Payer: Self-pay | Admitting: Vascular Surgery

## 2022-06-08 ENCOUNTER — Other Ambulatory Visit: Payer: Self-pay

## 2022-06-08 ENCOUNTER — Ambulatory Visit (INDEPENDENT_AMBULATORY_CARE_PROVIDER_SITE_OTHER): Payer: Medicaid Other | Admitting: Vascular Surgery

## 2022-06-08 DIAGNOSIS — I872 Venous insufficiency (chronic) (peripheral): Secondary | ICD-10-CM

## 2022-06-08 MED ORDER — TRAZODONE HCL 50 MG PO TABS
50.0000 mg | ORAL_TABLET | Freq: Every evening | ORAL | 0 refills | Status: DC | PRN
  Filled 2022-06-08 – 2022-07-20 (×3): qty 30, 30d supply, fill #0
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
## 2022-06-09 ENCOUNTER — Other Ambulatory Visit: Payer: Self-pay

## 2022-06-14 ENCOUNTER — Other Ambulatory Visit: Payer: Self-pay

## 2022-06-15 ENCOUNTER — Other Ambulatory Visit: Payer: Self-pay

## 2022-06-17 ENCOUNTER — Other Ambulatory Visit: Payer: Self-pay

## 2022-06-18 ENCOUNTER — Other Ambulatory Visit: Payer: Self-pay

## 2022-07-01 ENCOUNTER — Other Ambulatory Visit: Payer: Self-pay

## 2022-07-06 ENCOUNTER — Other Ambulatory Visit: Payer: Self-pay

## 2022-07-12 ENCOUNTER — Other Ambulatory Visit: Payer: Self-pay

## 2022-07-20 ENCOUNTER — Other Ambulatory Visit: Payer: Self-pay

## 2022-07-21 ENCOUNTER — Other Ambulatory Visit: Payer: Self-pay

## 2022-08-06 ENCOUNTER — Other Ambulatory Visit: Payer: Self-pay

## 2022-08-06 MED ORDER — BUPROPION HCL ER (SMOKING DET) 150 MG PO TB12
ORAL_TABLET | ORAL | 0 refills | Status: DC
  Filled 2022-08-06: qty 60, 30d supply, fill #0

## 2022-08-06 MED ORDER — ESCITALOPRAM OXALATE 10 MG PO TABS
10.0000 mg | ORAL_TABLET | Freq: Every day | ORAL | 0 refills | Status: DC
  Filled 2022-08-06 – 2022-09-03 (×2): qty 30, 30d supply, fill #0

## 2022-08-06 MED ORDER — QUETIAPINE FUMARATE 150 MG PO TABS
ORAL_TABLET | ORAL | 0 refills | Status: DC
  Filled 2022-08-06: qty 30, 30d supply, fill #0

## 2022-08-09 ENCOUNTER — Other Ambulatory Visit: Payer: Self-pay

## 2022-08-10 ENCOUNTER — Other Ambulatory Visit: Payer: Self-pay

## 2022-08-16 ENCOUNTER — Other Ambulatory Visit: Payer: Self-pay

## 2022-08-26 ENCOUNTER — Other Ambulatory Visit: Payer: Self-pay

## 2022-09-01 ENCOUNTER — Other Ambulatory Visit: Payer: Self-pay

## 2022-09-03 ENCOUNTER — Other Ambulatory Visit (HOSPITAL_BASED_OUTPATIENT_CLINIC_OR_DEPARTMENT_OTHER): Payer: Self-pay

## 2022-09-03 ENCOUNTER — Other Ambulatory Visit (HOSPITAL_COMMUNITY): Payer: Self-pay

## 2022-09-13 ENCOUNTER — Other Ambulatory Visit (HOSPITAL_COMMUNITY): Payer: Self-pay

## 2022-09-20 ENCOUNTER — Other Ambulatory Visit: Payer: Self-pay

## 2022-09-20 ENCOUNTER — Other Ambulatory Visit (HOSPITAL_COMMUNITY): Payer: Self-pay

## 2022-09-20 ENCOUNTER — Other Ambulatory Visit: Payer: Self-pay | Admitting: Critical Care Medicine

## 2022-09-20 DIAGNOSIS — F3160 Bipolar disorder, current episode mixed, unspecified: Secondary | ICD-10-CM

## 2022-09-21 ENCOUNTER — Other Ambulatory Visit (HOSPITAL_COMMUNITY): Payer: Self-pay

## 2022-09-21 MED ORDER — MONTELUKAST SODIUM 10 MG PO TABS
ORAL_TABLET | ORAL | 0 refills | Status: DC
  Filled 2022-09-21: qty 30, 30d supply, fill #0

## 2022-09-21 MED ORDER — RISPERIDONE 2 MG PO TABS
2.0000 mg | ORAL_TABLET | Freq: Every day | ORAL | 0 refills | Status: DC
  Filled 2022-09-21: qty 30, 30d supply, fill #0

## 2022-09-21 NOTE — Telephone Encounter (Signed)
Requested medication (s) are due for refill today:   Provider to review  Requested medication (s) are on the active medication list:   Yes for both  Future visit scheduled:   No   Last ordered: Singulair 04/27/2022 #30, 3 refills;  Risperdal 04/27/2022 #30, 3 refills  Returned because of non delegated refill    Requested Prescriptions  Pending Prescriptions Disp Refills   montelukast (SINGULAIR) 10 MG tablet 30 tablet 3    Sig: Take 1 tablet by mouth tablet once a day     Pulmonology:  Leukotriene Inhibitors Passed - 09/20/2022  4:52 PM      Passed - Valid encounter within last 12 months    Recent Outpatient Visits           4 months ago New onset type 2 diabetes mellitus [facility])   [place] Elsie Stain, MD   5 months ago New onset type 2 diabetes mellitus [facility])   [facility] at [facility], Cari S, PA-C   8 months ago Hepatitis C virus infection without hepatic coma, unspecified chronicity   [place], [place], [place]       Future Appointments             In 1 week Michel Bickers, MD [facility]             risperiDONE (RISPERDAL) 2 MG tablet 30 tablet 3    Sig: Take 1 tablet (2 mg total) by mouth at bedtime.     Not Delegated - Psychiatry:  Antipsychotics - Second Generation (Atypical) - risperidone Failed - 09/20/2022  4:52 PM      Failed - This refill cannot be delegated      Failed - Lipid Panel in normal range within the last 12 months    Cholesterol, Total  Date Value Ref Range Status  12/05/2017 165 100 - 199 mg/dL Final   Cholesterol  Date Value Ref Range Status  12/27/2021 182 0 - 200 mg/dL Final   LDL Calculated  Date Value Ref Range Status  12/05/2017 87 0 - 99 mg/dL Final   LDL Cholesterol  Date Value Ref Range Status  12/27/2021 110 (H) 0 - 99 mg/dL Final    Comment:           Total Cholesterol/HDL:CHD  Risk Coronary Heart Disease Risk Table                     Men   Women  1/2 Average Risk   3.4   3.3  Average Risk       5.0   4.4  2 X Average Risk   9.6   7.1  3 X Average Risk  23.4   11.0        Use the calculated Patient Ratio above and the CHD Risk Table to determine the patient's CHD Risk.        ATP III CLASSIFICATION (LDL):  <100     mg/dL   Optimal  100-129  mg/dL   Near or Above                    Optimal  130-159  mg/dL   Borderline  160-189  mg/dL   High  >190     mg/dL   Very High Performed at [place] [address], [place], [place] [address]  HDL  Date Value Ref Range Status  12/27/2021 49 >40 mg/dL Final  12/05/2017 36 (L) >39 mg/dL Final   Triglycerides  Date Value Ref Range Status  12/27/2021 114 <150 mg/dL Final         Passed - TSH in normal range and within 360 days    TSH  Date Value Ref Range Status  12/25/2021 0.636 0.350 - 4.500 uIU/mL Final    Comment:    Performed by a 3rd Generation assay with a functional sensitivity of <=0.01 uIU/mL. Performed at [facility], [place] [address], [place], [place] [address]          Passed - Completed PHQ-2 or PHQ-9 in the last 360 days      Passed - Last BP in normal range    BP Readings from Last 1 Encounters:  06/08/22 111/67         Passed - Last Heart Rate in normal range    Pulse Readings from Last 1 Encounters:  06/08/22 72         Passed - Valid encounter within last 6 months    Recent Outpatient Visits           4 months ago New onset type 2 diabetes mellitus ([place])   [place] Elsie Stain, MD   5 months ago New onset type 2 diabetes mellitus [facility])   [facility] at [facility], Cari S, PA-C   8 months ago Hepatitis C virus infection without hepatic coma, unspecified chronicity   [place], [place], [place]       Future Appointments             In 1  week Michel Bickers, MD [facility]            Passed - CBC within normal limits and completed in the last 12 months    WBC  Date Value Ref Range Status  04/27/2022 9.2 3.4 - 10.8 x10E3/uL Final  12/25/2021 8.4 4.0 - 10.5 K/uL Final   RBC  Date Value Ref Range Status  04/27/2022 5.22 3.77 - 5.28 x10E6/uL Final  12/25/2021 4.19 3.87 - 5.11 MIL/uL Final   Hemoglobin  Date Value Ref Range Status  04/27/2022 16.6 (H) 11.1 - 15.9 g/dL Final   Hematocrit  Date Value Ref Range Status  04/27/2022 48.7 (H) 34.0 - 46.6 % Final   MCHC  Date Value Ref Range Status  04/27/2022 34.1 31.5 - 35.7 g/dL Final  12/25/2021 32.3 30.0 - 36.0 g/dL Final   [facility]  Date Value Ref Range Status  04/27/2022 31.8 26.6 - 33.0 pg Final  12/25/2021 31.5 26.0 - 34.0 pg Final   MCV  Date Value Ref Range Status  04/27/2022 93 79 - 97 fL Final   No results found for: "PLTCOUNTKUC", "LABPLAT", "POCPLA" RDW  Date Value Ref Range Status  04/27/2022 12.2 11.7 - 15.4 % Final         Passed - CMP within normal limits and completed in the last 12 months    Albumin  Date Value Ref Range Status  12/25/2021 4.0 3.5 - 5.0 g/dL Final   Alkaline Phosphatase  Date Value Ref Range Status  12/25/2021 92 38 - 126 U/L Final   Alkaline phosphatase (APISO)  Date Value Ref Range Status  04/01/2022 79 31 - 125 U/L Final   ALT  Date Value Ref Range  Status  04/01/2022 14 6 - 29 U/L Final  01/28/2022 45 (H) 6 - 29 U/L Final   AST  Date Value Ref Range Status  04/01/2022 13 10 - 35 U/L Final   BUN  Date Value Ref Range Status  04/01/2022 12 7 - 25 mg/dL Final  01/20/2022 6 6 - 24 mg/dL Final   Calcium  Date Value Ref Range Status  04/01/2022 8.6 8.6 - 10.2 mg/dL Final   CO2  Date Value Ref Range Status  04/01/2022 29 20 - 32 mmol/L Final   Creat  Date Value Ref Range Status  04/01/2022 0.77 0.50 - 0.99 mg/dL Final   Glucose, Bld  Date Value Ref Range  Status  04/01/2022 195 (H) 65 - 99 mg/dL Final    Comment:    .            Fasting reference interval . For someone without known diabetes, a glucose value >125 mg/dL indicates that they may have diabetes and this should be confirmed with a follow-up test. .    POC Glucose  Date Value Ref Range Status  04/27/2022 109 (A) 70 - 99 mg/dl Final   Potassium  Date Value Ref Range Status  04/01/2022 4.1 3.5 - 5.3 mmol/L Final   Sodium  Date Value Ref Range Status  04/01/2022 137 135 - 146 mmol/L Final  01/20/2022 143 134 - 144 mmol/L Final   Total Bilirubin  Date Value Ref Range Status  04/01/2022 0.4 0.2 - 1.2 mg/dL Final   Total Protein  Date Value Ref Range Status  04/01/2022 6.9 6.1 - 8.1 g/dL Final   eGFR  Date Value Ref Range Status  01/20/2022 108 >59 mL/min/1.73 Final   GFR, Estimated  Date Value Ref Range Status  12/27/2021 >60 >60 mL/min Final    Comment:    (NOTE) Calculated using the CKD-EPI Creatinine Equation (2021)

## 2022-09-22 ENCOUNTER — Other Ambulatory Visit: Payer: Self-pay

## 2022-09-22 ENCOUNTER — Other Ambulatory Visit (HOSPITAL_COMMUNITY): Payer: Self-pay

## 2022-09-22 MED ORDER — ESCITALOPRAM OXALATE 10 MG PO TABS
10.0000 mg | ORAL_TABLET | Freq: Every day | ORAL | 0 refills | Status: DC
  Filled 2022-09-22 – 2022-10-14 (×2): qty 90, 90d supply, fill #0

## 2022-09-22 MED ORDER — TRAZODONE HCL 100 MG PO TABS
100.0000 mg | ORAL_TABLET | Freq: Every evening | ORAL | 0 refills | Status: DC | PRN
  Filled 2022-09-22: qty 30, 30d supply, fill #0

## 2022-09-23 ENCOUNTER — Other Ambulatory Visit: Payer: Self-pay

## 2022-09-23 ENCOUNTER — Other Ambulatory Visit (HOSPITAL_COMMUNITY): Payer: Self-pay

## 2022-09-24 ENCOUNTER — Other Ambulatory Visit (HOSPITAL_COMMUNITY): Payer: Self-pay

## 2022-09-28 ENCOUNTER — Encounter: Payer: Self-pay | Admitting: Family

## 2022-09-28 ENCOUNTER — Ambulatory Visit: Payer: Medicaid Other | Admitting: Internal Medicine

## 2022-09-28 ENCOUNTER — Ambulatory Visit (INDEPENDENT_AMBULATORY_CARE_PROVIDER_SITE_OTHER): Payer: Medicaid Other | Admitting: Family

## 2022-09-28 ENCOUNTER — Other Ambulatory Visit: Payer: Self-pay

## 2022-09-28 VITALS — BP 118/78 | HR 78 | Temp 97.8°F | Wt 158.0 lb

## 2022-09-28 DIAGNOSIS — B182 Chronic viral hepatitis C: Secondary | ICD-10-CM

## 2022-09-28 DIAGNOSIS — A630 Anogenital (venereal) warts: Secondary | ICD-10-CM | POA: Insufficient documentation

## 2022-09-28 NOTE — Assessment & Plan Note (Signed)
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  Discussed next level of treatment is surgical intervention and she will discuss with her primary care at her next office visit.
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
## 2022-09-28 NOTE — Patient Instructions (Signed)
Nice to see you.  No further liver cancer screening is needed.  If you need to be tested for Hepatitis C in the future a Hepatitis C RNA level or viral load will need to be checked as the Hepatitis C antibody used for initial screening will always be positive.    No additional follow up necessary pending blood work.   Have a great day and stay safe!

## 2022-09-28 NOTE — Assessment & Plan Note (Signed)
Ms. Mcconnell has successfully with 12 weeks of Epclusa.  Feeling well today.  Discussed plan of care to recheck hepatitis C RNA level to determine sustained viremic response.  Discussed if hepatitis C evaluation is needed in the future to use hepatitis C RNA level as hepatitis C antibody will always be positive.  She has a low risk for fibrosis so no additional hepatocellular carcinoma screenings are needed at this time.  Check hepatitis C RNA level.  Follow-up pending lab work as needed.

## 2022-09-29 ENCOUNTER — Ambulatory Visit: Payer: Medicaid Other | Admitting: Internal Medicine

## 2022-09-30 ENCOUNTER — Telehealth: Payer: Self-pay

## 2022-09-30 LAB — HEPATITIS C RNA QUANTITATIVE
HCV Quantitative Log: <1.18 log IU/mL
HCV RNA, PCR, QN: <15 IU/mL

## 2022-09-30 NOTE — Telephone Encounter (Signed)
-----   Message from [place], Anthony sent at 09/30/2022 10:50 AM EDT ----- Please inform Ms. Candace Howard that her Hepatitis C is cured and no further treatment is needed.

## 2022-09-30 NOTE — Telephone Encounter (Signed)
Patient aware of results.   Candace Howard P Candace Howard, CMA  

## 2022-10-11 ENCOUNTER — Other Ambulatory Visit (HOSPITAL_COMMUNITY): Payer: Self-pay

## 2022-10-14 ENCOUNTER — Other Ambulatory Visit: Payer: Self-pay | Admitting: Critical Care Medicine

## 2022-10-14 ENCOUNTER — Other Ambulatory Visit (HOSPITAL_COMMUNITY): Payer: Self-pay

## 2022-10-14 DIAGNOSIS — F3160 Bipolar disorder, current episode mixed, unspecified: Secondary | ICD-10-CM

## 2022-10-14 MED ORDER — MONTELUKAST SODIUM 10 MG PO TABS
10.0000 mg | ORAL_TABLET | Freq: Every day | ORAL | 0 refills | Status: DC
  Filled 2022-10-14: qty 30, 30d supply, fill #0

## 2022-10-14 MED ORDER — OMEPRAZOLE 40 MG PO CPDR
40.0000 mg | DELAYED_RELEASE_CAPSULE | Freq: Every evening | ORAL | 0 refills | Status: DC
  Filled 2022-10-14: qty 30, 30d supply, fill #0

## 2022-10-14 MED ORDER — RISPERIDONE 2 MG PO TABS
2.0000 mg | ORAL_TABLET | Freq: Every day | ORAL | 0 refills | Status: DC
  Filled 2022-10-14 – 2022-10-18 (×2): qty 30, 30d supply, fill #0

## 2022-10-15 ENCOUNTER — Other Ambulatory Visit (HOSPITAL_COMMUNITY): Payer: Self-pay

## 2022-10-16 ENCOUNTER — Ambulatory Visit: Payer: Medicaid Other | Attending: Family | Admitting: Family

## 2022-10-16 ENCOUNTER — Encounter: Payer: Self-pay | Admitting: Family

## 2022-10-16 ENCOUNTER — Other Ambulatory Visit (HOSPITAL_COMMUNITY): Payer: Self-pay

## 2022-10-16 VITALS — BP 113/76 | HR 67 | Resp 16 | Wt 158.2 lb

## 2022-10-16 DIAGNOSIS — J45909 Unspecified asthma, uncomplicated: Secondary | ICD-10-CM | POA: Insufficient documentation

## 2022-10-16 DIAGNOSIS — Z7182 Exercise counseling: Secondary | ICD-10-CM | POA: Insufficient documentation

## 2022-10-16 DIAGNOSIS — E119 Type 2 diabetes mellitus without complications: Secondary | ICD-10-CM | POA: Diagnosis not present

## 2022-10-16 DIAGNOSIS — F333 Major depressive disorder, recurrent, severe with psychotic symptoms: Secondary | ICD-10-CM | POA: Diagnosis not present

## 2022-10-16 DIAGNOSIS — F17200 Nicotine dependence, unspecified, uncomplicated: Secondary | ICD-10-CM | POA: Diagnosis not present

## 2022-10-16 DIAGNOSIS — A630 Anogenital (venereal) warts: Secondary | ICD-10-CM | POA: Insufficient documentation

## 2022-10-16 DIAGNOSIS — F1410 Cocaine abuse, uncomplicated: Secondary | ICD-10-CM | POA: Insufficient documentation

## 2022-10-16 DIAGNOSIS — Z7984 Long term (current) use of oral hypoglycemic drugs: Secondary | ICD-10-CM | POA: Insufficient documentation

## 2022-10-16 MED ORDER — PODOFILOX 0.5 % EX SOLN
CUTANEOUS | 0 refills | Status: DC
  Filled 2022-10-16: qty 3.5, 30d supply, fill #0

## 2022-10-16 MED ORDER — METFORMIN HCL ER 500 MG PO TB24
500.0000 mg | ORAL_TABLET | Freq: Every day | ORAL | 1 refills | Status: DC
  Filled 2022-10-16: qty 90, 90d supply, fill #0

## 2022-10-16 NOTE — Patient Instructions (Addendum)
1) Medications refilled 2) We referred you to surgery, they will call you for evaluation

## 2022-10-16 NOTE — Progress Notes (Signed)
Candace Howard, is a 51 y.o. female  GUR:[id number]  EGB:[id number]  DOB - 03-04-1971  Subjective:  Chief Complaint and HPI: Candace Howard is a 51 y.o. female here today with complaints of 5 small warts to anus.  Patient started experiencing pain and realized that she had small warts to her anal area about 5 months ago.  She has been applying OTC cream with relief but sometimes the cream does not help, patient experiences bleeding and pain, mostly with constipation. Patient is sexually active with one female partner. Patient has a past medical history of alcohol and cocaine abuse, asthma, type 2 diabetes, and tobacco abuse.  Denies fever, chills, chest pain, frequencies, polyuria, polyphagia, polydipsia, or any other symptom than above.  Patient wants referral for surgery to remove the warts.  ED/Hospital notes reviewed.   Social History: Reviewed Family history: Reviewed  ROS:   Constitutional:  No f/c, No night sweats, No unexplained weight loss. EENT:  No vision changes, No blurry vision, No hearing changes. No mouth, throat, or ear problems.  Respiratory: No cough, No SOB Cardiac: No CP, no palpitations GI:  No abd pain, No N/V/D. GU: Reports pain and 6 small warts to her anus.  No Urinary s/sx Musculoskeletal: No joint pain Neuro: No headache, no dizziness, no motor weakness.  Skin: No rash Endocrine:  No polydipsia. No polyuria.  Psych: Denies SI/HI  No problems updated.  ALLERGIES: Allergies  Allergen Reactions   Hydrocodone     Other reaction(s): Unknown/See Comments "Feel really high"    PAST MEDICAL HISTORY: Past Medical History:  Diagnosis Date   Anemia    Chest pain    Depression    Difficulty breathing    Frequent headaches    IBS (irritable bowel syndrome)    Palpitations    Poor circulation     MEDICATIONS AT HOME: Prior to Admission medications   Medication Sig Start Date End Date Taking? Authorizing Provider  Accu-Chek Softclix Lancets lancets Use  as instructed 04/01/22   Mayers, Cari S, PA-C  albuterol (PROVENTIL) (2.5 MG/3ML) 0.083% nebulizer solution use 53m (1 vial) every 6 hours as needed 03/02/22     ALBUTEROL IN Inhale into the lungs.    [provider]  Blood Glucose Monitoring Suppl (ACCU-CHEK GUIDE) w/Device KIT use as directed once daily 04/01/22   Mayers, Cari S, PA-C  budesonide-formoterol (SYMBICORT) 160-4.5 MCG/ACT inhaler Take 2 inhalations 2 times a day 03/02/22     buPROPion (ZYBAN) 150 MG 12 hr tablet Take 1 tablet by mouth two times daily. 08/06/22     clotrimazole (LOTRIMIN) 1 % external solution Apply 1 application. topically 2 (two) times daily. In between toes Patient not taking: Reported on 06/08/2022 02/25/22   SLandis Martins DPM  escitalopram (LEXAPRO) 10 MG tablet Take 1 tablet (10 mg total) by mouth daily. 04/27/22   WElsie Stain MD  escitalopram (LEXAPRO) 10 MG tablet Take 1 tab by mouth daily 08/06/22     escitalopram (LEXAPRO) 10 MG tablet Take 1 tablet (10 mg total) by mouth daily. 09/22/22     fluticasone (FLONASE) 50 MCG/ACT nasal spray Insert 1 spray into each nostril 2 times a day 04/27/22   WElsie Stain MD  gabapentin (NEURONTIN) 400 MG capsule Take 1 capsule (400 mg total) by mouth 3 (three) times daily. 04/27/22   WElsie Stain MD  glucose blood (ACCU-CHEK GUIDE) test strip use  2 (two) times daily as instructed 04/01/22   Mayers, CLoraine Grip PA-C  hydrOXYzine (ATARAX) 25 MG tablet Take 1 tablet (25 mg total) by mouth 3 (three) times daily as needed for anxiety. 04/27/22   Elsie Stain, MD  imiquimod [place]) 5 % cream Apply 1 application three times a week externally 03/29/22     ipratropium-albuterol (DUONEB) 0.5-2.5 (3) MG/3ML SOLN Take 3 mLs by nebulization every 6 (six) hours as needed. 04/27/22   Elsie Stain, MD  metFORMIN (GLUCOPHAGE-XR) 500 MG 24 hr tablet Take 1 tablet (500 mg total) by mouth daily with breakfast. 10/16/22   Feliberto Gottron, FNP  montelukast (SINGULAIR) 10 MG  tablet Take 1 tablet (10 mg total) by mouth daily. Needs office visit. 10/14/22   Elsie Stain, MD  nicotine (NICODERM CQ - DOSED IN MG/24 HOURS) 21 mg/24hr patch Place 1 patch (21 mg total) onto the skin daily. 04/27/22   Elsie Stain, MD  nicotine polacrilex (NICORETTE MINI) 4 MG lozenge Use three times a day to quit smoking 04/27/22   Elsie Stain, MD  omeprazole (PRILOSEC) 40 MG capsule Take 1 capsule (40 mg total) by mouth at bedtime. 10/14/22   Elsie Stain, MD  QUEtiapine Fumarate 150 MG TABS Take 1/2 - 1 tab po qhs for mood and sleep 08/06/22     risperiDONE (RISPERDAL) 2 MG tablet Take 1 tablet (2 mg total) by mouth at bedtime.--must have office visit for further refills 10/14/22   Elsie Stain, MD  traZODone (DESYREL) 100 MG tablet Take 1 tablet (100 mg total) by mouth at bedtime as needed for sleep. 09/22/22     traZODone (DESYREL) 50 MG tablet Take 1 tablet (50 mg total) by mouth at bedtime as needed for sleep . 06/08/22        Objective:  EXAM:   Vitals:   10/16/22 1124  BP: 113/76  Pulse: 67  Resp: 16  SpO2: 96%  Weight: 158 lb 3.2 oz (71.8 kg)    General appearance : A&OX3. NAD. Non-toxic-appearing HEENT: Atraumatic and Normocephalic.  PERRLA. EOM intact.  TM clear B. Mouth-MMM, post pharynx WNL w/o erythema, No PND. Neck: supple, no JVD. No cervical lymphadenopathy. No thyromegaly Chest/Lungs:  Breathing-non-labored, Good air entry bilaterally, breath sounds normal without rales, rhonchi, or wheezing  CVS: S1 S2 regular, no murmurs, gallops, rubs  Abdomen: Bowel sounds present, Non tender and not distended with no gaurding, rigidity or rebound. GU: Deferred Extremities: Bilateral Lower Ext shows no edema, both legs are warm to touch with = pulse throughout Neurology:  CN II-XII grossly intact, Non focal.   Psych:  TP linear. J/I WNL. Normal speech. Appropriate eye contact and affect.  Skin:  No Rash  Data Review Lab Results  Component Value Date    HGBA1C 5.4 12/25/2021     Assessment & Plan   1. Anal warts -Podofilox 0.5% to apply as indicated. -Instructed to use cream to moisturize the area -Instructed to practice safe sex, abstain from sexual activities until healed, verbalized understanding -Schedule an appointment with primary care for STD test  - Have sexual partner tested too for all the conditions including HPV. - Ambulatory referral to General Surgery  2. Tobacco dependence  -Cut back on smoking   -Contact the clinic when she is ready for help to quit smoking  -Stay hydrated and eat food high in protein  3. New onset type 2 diabetes mellitus (HCC) - metFORMIN (GLUCOPHAGE-XR) 500 MG 24 hr tablet; Take 1 tablet (500 mg total) by mouth daily with breakfast.  Dispense: 90  tablet; Refill: 1 -Check blood sugar once a day and report consistent blood sugar less than 70 or greater than 249 to the clinic or local ED. -Eat plant-based diet and increase physical activities. -Cut back on sugary drinks or for processed food. -Follow-up in 3 months.    Patient have been counseled extensively about nutrition and exercise  Return in about 3 months (around 01/16/2023).  The patient was given clear instructions to go to ER or return to medical center if symptoms don't improve, worsen or new problems develop. The patient verbalized understanding. The patient was told to call to get lab results if they haven't heard anything in the next week.     Feliberto Gottron, APRN, FNP-C [facility] [place], [place]   10/16/2022, 11:58 AM

## 2022-10-18 ENCOUNTER — Other Ambulatory Visit (HOSPITAL_COMMUNITY): Payer: Self-pay

## 2022-10-25 ENCOUNTER — Other Ambulatory Visit: Payer: Self-pay

## 2022-10-25 ENCOUNTER — Other Ambulatory Visit (HOSPITAL_COMMUNITY): Payer: Self-pay

## 2022-10-25 MED ORDER — BUPROPION HCL ER (SMOKING DET) 150 MG PO TB12
150.0000 mg | ORAL_TABLET | Freq: Every day | ORAL | 0 refills | Status: DC
  Filled 2022-10-25 (×2): qty 30, 30d supply, fill #0

## 2022-10-25 MED ORDER — TRAZODONE HCL 100 MG PO TABS
100.0000 mg | ORAL_TABLET | Freq: Every evening | ORAL | 0 refills | Status: DC | PRN
  Filled 2022-10-25 (×2): qty 90, 90d supply, fill #0

## 2022-10-27 ENCOUNTER — Other Ambulatory Visit (HOSPITAL_COMMUNITY): Payer: Self-pay

## 2022-11-02 ENCOUNTER — Other Ambulatory Visit (HOSPITAL_COMMUNITY): Payer: Self-pay

## 2022-11-04 ENCOUNTER — Other Ambulatory Visit (HOSPITAL_COMMUNITY): Payer: Self-pay

## 2022-11-05 ENCOUNTER — Other Ambulatory Visit (HOSPITAL_COMMUNITY): Payer: Self-pay

## 2022-11-06 ENCOUNTER — Other Ambulatory Visit (HOSPITAL_COMMUNITY): Payer: Self-pay

## 2022-11-10 ENCOUNTER — Other Ambulatory Visit (HOSPITAL_COMMUNITY): Payer: Self-pay

## 2022-11-22 ENCOUNTER — Other Ambulatory Visit: Payer: Self-pay | Admitting: Critical Care Medicine

## 2022-11-22 ENCOUNTER — Other Ambulatory Visit (HOSPITAL_COMMUNITY): Payer: Self-pay

## 2022-11-23 ENCOUNTER — Other Ambulatory Visit: Payer: Self-pay

## 2022-11-23 ENCOUNTER — Other Ambulatory Visit (HOSPITAL_COMMUNITY): Payer: Self-pay

## 2022-11-23 MED ORDER — RISPERIDONE 2 MG PO TABS
2.0000 mg | ORAL_TABLET | Freq: Every day | ORAL | 0 refills | Status: DC
  Filled 2022-11-23 (×2): qty 30, 30d supply, fill #0

## 2022-11-23 MED ORDER — MONTELUKAST SODIUM 10 MG PO TABS
10.0000 mg | ORAL_TABLET | Freq: Every day | ORAL | 0 refills | Status: DC
  Filled 2022-11-23 – 2022-12-31 (×2): qty 30, 30d supply, fill #0

## 2022-11-23 MED ORDER — RISPERIDONE 2 MG PO TABS
2.0000 mg | ORAL_TABLET | Freq: Every day | ORAL | 0 refills | Status: DC
  Filled 2022-11-23 – 2023-01-22 (×3): qty 30, 30d supply, fill #0

## 2022-11-23 NOTE — Telephone Encounter (Signed)
Requested medication (s) are due for refill today: yes  Requested medication (s) are on the active medication list: yes  Last refill:  10/14/22  Future visit scheduled: yes  Notes to clinic:  Unable to refill per protocol, courtesy refill already given, routing for provider approval.      Requested Prescriptions  Pending Prescriptions Disp Refills   montelukast (SINGULAIR) 10 MG tablet 30 tablet 0    Sig: Take 1 tablet (10 mg total) by mouth daily. Needs office visit.     Pulmonology:  Leukotriene Inhibitors Passed - 11/22/2022  5:10 PM      Passed - Valid encounter within last 12 months    Recent Outpatient Visits           1 month ago Anal warts   [facility] [place], Jomarie Longs, FNP   7 months ago New onset type 2 diabetes mellitus [facility])   [facility] [place] Frisk, MD   7 months ago New onset type 2 diabetes mellitus [facility])   [facility], Cari S, PA-C   10 months ago Hepatitis C virus infection without hepatic coma, unspecified chronicity   [facility] [place], [place], [place]

## 2022-12-04 ENCOUNTER — Other Ambulatory Visit (HOSPITAL_COMMUNITY): Payer: Self-pay

## 2022-12-15 ENCOUNTER — Other Ambulatory Visit: Payer: Self-pay

## 2022-12-15 ENCOUNTER — Other Ambulatory Visit (HOSPITAL_COMMUNITY): Payer: Self-pay

## 2022-12-15 MED ORDER — RISPERIDONE 2 MG PO TABS
2.0000 mg | ORAL_TABLET | Freq: Every day | ORAL | 2 refills | Status: DC
  Filled 2022-12-15 – 2022-12-31 (×2): qty 30, 30d supply, fill #0

## 2022-12-15 MED ORDER — TRAZODONE HCL 100 MG PO TABS
100.0000 mg | ORAL_TABLET | Freq: Every evening | ORAL | 0 refills | Status: DC | PRN
  Filled 2022-12-15 – 2023-01-14 (×3): qty 90, 90d supply, fill #0
  Filled ????-??-??: fill #0

## 2022-12-29 ENCOUNTER — Other Ambulatory Visit (HOSPITAL_COMMUNITY): Payer: Self-pay

## 2022-12-31 ENCOUNTER — Other Ambulatory Visit (HOSPITAL_COMMUNITY): Payer: Self-pay

## 2023-01-04 ENCOUNTER — Other Ambulatory Visit (HOSPITAL_COMMUNITY): Payer: Self-pay

## 2023-01-13 ENCOUNTER — Other Ambulatory Visit (HOSPITAL_COMMUNITY): Payer: Self-pay

## 2023-01-14 ENCOUNTER — Other Ambulatory Visit (HOSPITAL_COMMUNITY): Payer: Self-pay

## 2023-01-14 ENCOUNTER — Other Ambulatory Visit: Payer: Self-pay

## 2023-01-22 ENCOUNTER — Other Ambulatory Visit (HOSPITAL_COMMUNITY): Payer: Self-pay

## 2023-01-24 ENCOUNTER — Other Ambulatory Visit: Payer: Self-pay

## 2023-01-24 ENCOUNTER — Other Ambulatory Visit (HOSPITAL_COMMUNITY): Payer: Self-pay

## 2023-02-02 ENCOUNTER — Ambulatory Visit: Payer: Medicaid Other | Attending: Critical Care Medicine | Admitting: Critical Care Medicine

## 2023-02-02 ENCOUNTER — Encounter: Payer: Self-pay | Admitting: Critical Care Medicine

## 2023-02-02 ENCOUNTER — Other Ambulatory Visit: Payer: Self-pay

## 2023-02-02 VITALS — BP 100/63 | HR 64 | Temp 97.8°F | Resp 24 | Wt 170.0 lb

## 2023-02-02 DIAGNOSIS — B182 Chronic viral hepatitis C: Secondary | ICD-10-CM

## 2023-02-02 DIAGNOSIS — E119 Type 2 diabetes mellitus without complications: Secondary | ICD-10-CM | POA: Diagnosis not present

## 2023-02-02 DIAGNOSIS — E1151 Type 2 diabetes mellitus with diabetic peripheral angiopathy without gangrene: Secondary | ICD-10-CM | POA: Insufficient documentation

## 2023-02-02 DIAGNOSIS — Z87891 Personal history of nicotine dependence: Secondary | ICD-10-CM

## 2023-02-02 DIAGNOSIS — R1312 Dysphagia, oropharyngeal phase: Secondary | ICD-10-CM | POA: Insufficient documentation

## 2023-02-02 DIAGNOSIS — I739 Peripheral vascular disease, unspecified: Secondary | ICD-10-CM

## 2023-02-02 DIAGNOSIS — F411 Generalized anxiety disorder: Secondary | ICD-10-CM

## 2023-02-02 DIAGNOSIS — E785 Hyperlipidemia, unspecified: Secondary | ICD-10-CM | POA: Diagnosis not present

## 2023-02-02 DIAGNOSIS — J382 Nodules of vocal cords: Secondary | ICD-10-CM | POA: Diagnosis not present

## 2023-02-02 DIAGNOSIS — F333 Major depressive disorder, recurrent, severe with psychotic symptoms: Secondary | ICD-10-CM

## 2023-02-02 DIAGNOSIS — E6609 Other obesity due to excess calories: Secondary | ICD-10-CM

## 2023-02-02 DIAGNOSIS — Z23 Encounter for immunization: Secondary | ICD-10-CM | POA: Diagnosis not present

## 2023-02-02 DIAGNOSIS — F3160 Bipolar disorder, current episode mixed, unspecified: Secondary | ICD-10-CM

## 2023-02-02 DIAGNOSIS — F17200 Nicotine dependence, unspecified, uncomplicated: Secondary | ICD-10-CM

## 2023-02-02 DIAGNOSIS — J441 Chronic obstructive pulmonary disease with (acute) exacerbation: Secondary | ICD-10-CM | POA: Diagnosis not present

## 2023-02-02 DIAGNOSIS — R490 Dysphonia: Secondary | ICD-10-CM | POA: Insufficient documentation

## 2023-02-02 DIAGNOSIS — F1421 Cocaine dependence, in remission: Secondary | ICD-10-CM

## 2023-02-02 DIAGNOSIS — Z6829 Body mass index (BMI) 29.0-29.9, adult: Secondary | ICD-10-CM

## 2023-02-02 DIAGNOSIS — K860 Alcohol-induced chronic pancreatitis: Secondary | ICD-10-CM

## 2023-02-02 DIAGNOSIS — G629 Polyneuropathy, unspecified: Secondary | ICD-10-CM

## 2023-02-02 DIAGNOSIS — Z1231 Encounter for screening mammogram for malignant neoplasm of breast: Secondary | ICD-10-CM

## 2023-02-02 LAB — POCT GLYCOSYLATED HEMOGLOBIN (HGB A1C): HbA1c, POC (controlled diabetic range): 7.1 % — AB (ref 0.0–7.0)

## 2023-02-02 MED ORDER — BUDESONIDE-FORMOTEROL FUMARATE 160-4.5 MCG/ACT IN AERO
2.0000 | INHALATION_SPRAY | Freq: Two times a day (BID) | RESPIRATORY_TRACT | 3 refills | Status: DC
  Filled 2023-02-02: qty 10.2, 30d supply, fill #0
  Filled 2023-05-09: qty 10.2, 30d supply, fill #1

## 2023-02-02 MED ORDER — MONTELUKAST SODIUM 10 MG PO TABS
10.0000 mg | ORAL_TABLET | Freq: Every day | ORAL | 2 refills | Status: DC
  Filled 2023-02-02: qty 60, 60d supply, fill #0

## 2023-02-02 MED ORDER — FLUTICASONE PROPIONATE 50 MCG/ACT NA SUSP
NASAL | 3 refills | Status: DC
  Filled 2023-02-02: qty 16, 30d supply, fill #0

## 2023-02-02 MED ORDER — QUETIAPINE FUMARATE 150 MG PO TABS
75.0000 mg | ORAL_TABLET | Freq: Every evening | ORAL | 0 refills | Status: DC | PRN
  Filled 2023-02-02: qty 30, 30d supply, fill #0

## 2023-02-02 MED ORDER — TRAZODONE HCL 100 MG PO TABS
100.0000 mg | ORAL_TABLET | Freq: Every evening | ORAL | 0 refills | Status: DC | PRN
  Filled 2023-02-02: qty 90, 90d supply, fill #0

## 2023-02-02 MED ORDER — GABAPENTIN 100 MG PO CAPS
100.0000 mg | ORAL_CAPSULE | Freq: Every day | ORAL | 2 refills | Status: DC
  Filled 2023-02-02: qty 90, 90d supply, fill #0

## 2023-02-02 MED ORDER — RISPERIDONE 2 MG PO TABS
2.0000 mg | ORAL_TABLET | Freq: Every day | ORAL | 2 refills | Status: DC
  Filled 2023-02-02: qty 30, 30d supply, fill #0

## 2023-02-02 MED ORDER — OMEPRAZOLE 40 MG PO CPDR
40.0000 mg | DELAYED_RELEASE_CAPSULE | Freq: Every evening | ORAL | 2 refills | Status: DC
  Filled 2023-02-02: qty 60, 60d supply, fill #0

## 2023-02-02 MED ORDER — GLIPIZIDE 5 MG PO TABS
5.0000 mg | ORAL_TABLET | Freq: Two times a day (BID) | ORAL | 3 refills | Status: DC
  Filled 2023-02-02: qty 60, 30d supply, fill #0
  Filled 2023-08-05: qty 60, 30d supply, fill #1

## 2023-02-02 NOTE — Patient Instructions (Signed)
Mammogram will be scheduled  Labs today include metabolic panel  Referral to lung and throat doctor will be made  Refills on all medications sent to [facility] to be mailed to you, reduce gabapentin 100 mg a day and start glipizide twice a day and stop metformin  You have an appointment February 20 with mental health Ms. Ronne Binning see below for address [facility] [address], [place], [place] [address] [phone] or [phone]   Return to Dr. Joya Gaskins 3 months

## 2023-02-02 NOTE — Progress Notes (Unsigned)
New Patient Office Visit  Subjective    Patient ID: Candace Howard, female    DOB: 08-19-1971  Age: 52 y.o. 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  She also has active tobacco use history of asthma.  Patient also has history of diabetes.  Patient was seen previously by our PA in our mobile medicine unit as noted below. [facility] 03/2022:  Candace Howard reports that she was seen at the [facility] at the beginning of the week and was told that her A1c was 7.1.  States that she has not previously been treated for diabetes in the past.  States that she was placed on steroids for several weeks of the last month and is unsure if the elevation in A1c is due to that.  States that she has gained 10 pounds since becoming sober.  States that she has been taking risperidone for the past year.  States that she is working with her behavioral health provider and is currently using nicotine patch to work on stopping smoking.  States that she was smoking 30 to 40 cigarettes a day, states that she is currently smoking around 10.  States that [facility] with Dr. Joya Gaskins   Patient now here in our clinic to establish care.  She does live in [place] in [place].  She does have bipolar and severe depression.  Her PHQ-9 is 3 and GAD-7 is 3 today.  Patient does have type 2 diabetes is on the metformin.  Blood sugars have been reasonably controlled.  She was smoking 2 packs a day of cigarettes now is down to 1 pack a day.  She still has a dry cough productive cough.  She follows with a pulmonary doctor in [place].  She is due a pneumonia vaccine and  tetanus vaccine she will receive both.  She is on the  risperidone and Lexapro is taking both.  The patient had a colonoscopy and a Pap smear recently done.  02/02/23 Patient seen in return follow-up she has been a chain smoker and has successfully quit smoking I congratulated her in this regard.  Unfortunately she has developed increased choking and hoarseness.  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  She is now moved to [place] and has acquired Medicaid and housing.  Patient had right elbow pain in the past this is resolved  Patient states her blood sugars at home are anywhere from 2-300.  She Complains still of neuropathic pain in the feet and has been on gabapentin.  She also needs psychiatric access as she has been on medications for same and needs access now and needs refills.  She also had a pulmonary doctor in [facility] would like a referral here in [place] for her COPD she is on inhaled medications.  She would like her medicines sent to [facility] for home delivery here in [place]. For her diabetes she has not been able to tolerate metformin because of GI side effects  Outpatient Encounter Medications as of 02/02/2023  Medication Sig   Accu-Chek Softclix Lancets  lancets Use as instructed   ALBUTEROL IN Inhale into the lungs.   glipiZIDE (GLUCOTROL) 5 MG tablet Take 1 tablet (5 mg total) by mouth 2 (two) times daily before a meal.   glucose blood (ACCU-CHEK GUIDE) test strip use  2 (two) times daily as instructed   ipratropium-albuterol (DUONEB) 0.5-2.5 (3) MG/3ML SOLN Take 3 mLs by nebulization every 6 (six) hours as needed.   podofilox (CONDYLOX) 0.5 % external solution Apply topically every 12 hours in the morning and evening for 3 days, then withhold for 4 days; repeat cycle up to 4 times   [DISCONTINUED] budesonide-formoterol (SYMBICORT) 160-4.5 MCG/ACT inhaler Take 2 inhalations 2 times a day    [DISCONTINUED] fluticasone (FLONASE) 50 MCG/ACT nasal spray Insert 1 spray into each nostril 2 times a day   [DISCONTINUED] gabapentin (NEURONTIN) 400 MG capsule Take 1 capsule (400 mg total) by mouth 3 (three) times daily. (Patient taking differently: Take 400 mg by mouth at bedtime.)   [DISCONTINUED] montelukast (SINGULAIR) 10 MG tablet Take 1 tablet (10 mg total) by mouth daily. Needs office visit.   [DISCONTINUED] omeprazole (PRILOSEC) 40 MG capsule Take 1 capsule (40 mg total) by mouth at bedtime.   [DISCONTINUED] risperiDONE (RISPERDAL) 2 MG tablet Take 1 tablet (2 mg total) by mouth daily.   [DISCONTINUED] risperiDONE (RISPERDAL) 2 MG tablet Take 1 tablet (2 mg total) by mouth daily.   [DISCONTINUED] traZODone (DESYREL) 100 MG tablet Take 1 tablet (100 mg total) by mouth at bedtime as needed for sleep.   [DISCONTINUED] traZODone (DESYREL) 50 MG tablet Take 1 tablet (50 mg total) by mouth at bedtime as needed for sleep .   Blood Glucose Monitoring Suppl (ACCU-CHEK GUIDE) w/Device KIT use as directed once daily   budesonide-formoterol (SYMBICORT) 160-4.5 MCG/ACT inhaler Inhale 2 puffs into the lungs 2 (two) times daily.   fluticasone (FLONASE) 50 MCG/ACT nasal spray Insert 1 spray into each nostril 2 times a day   gabapentin (NEURONTIN) 100 MG capsule Take 1 capsule (100 mg total) by mouth at bedtime.   montelukast (SINGULAIR) 10 MG tablet Take 1 tablet (10 mg total) by mouth daily. Needs office visit.   omeprazole (PRILOSEC) 40 MG capsule Take 1 capsule (40 mg total) by mouth at bedtime.   QUEtiapine Fumarate 150 MG TABS Take 75-150 mg by mouth at bedtime as needed for mood or sleep   risperiDONE (RISPERDAL) 2 MG tablet Take 1 tablet (2 mg total) by mouth daily.   traZODone (DESYREL) 100 MG tablet Take 1 tablet (100 mg total) by mouth at bedtime as needed for sleep.   [DISCONTINUED] albuterol (PROVENTIL) (2.5 MG/3ML) 0.083% nebulizer solution use 5m (1 vial) every 6 hours as needed (Patient  not taking: Reported on 02/02/2023)   [DISCONTINUED] buPROPion (ZYBAN) 150 MG 12 hr tablet Take 1 tablet by mouth daily (Patient not taking: Reported on 02/02/2023)   [DISCONTINUED] clotrimazole (LOTRIMIN) 1 % external solution Apply 1 application. topically 2 (two) times daily. In between toes (Patient not taking: Reported on 06/08/2022)   [DISCONTINUED] escitalopram (LEXAPRO) 10 MG tablet Take 1 tablet (10 mg total) by mouth daily. (Patient not taking: Reported on 02/02/2023)   [DISCONTINUED] escitalopram (LEXAPRO) 10 MG tablet Take 1 tab by mouth daily (Patient not taking: Reported on 02/02/2023)   [DISCONTINUED] escitalopram (LEXAPRO) 10 MG tablet Take 1 tablet (10 mg total) by mouth daily.   [DISCONTINUED] hydrOXYzine (ATARAX) 25 MG tablet Take 1 tablet (25 mg total) by mouth 3 (three) times daily as needed for  anxiety.   [DISCONTINUED] imiquimod (ALDARA) 5 % cream Apply 1 application three times a week externally   [DISCONTINUED] metFORMIN (GLUCOPHAGE-XR) 500 MG 24 hr tablet Take 1 tablet (500 mg total) by mouth daily with breakfast. (Patient not taking: Reported on 02/02/2023)   [DISCONTINUED] nicotine (NICODERM CQ - DOSED IN MG/24 HOURS) 21 mg/24hr patch Place 1 patch (21 mg total) onto the skin daily. (Patient not taking: Reported on 02/02/2023)   [DISCONTINUED] nicotine polacrilex (NICORETTE MINI) 4 MG lozenge Use three times a day to quit smoking   [DISCONTINUED] QUEtiapine Fumarate 150 MG TABS Take 1/2 - 1 tab po qhs for mood and sleep (Patient not taking: Reported on 02/02/2023)   [DISCONTINUED] risperiDONE (RISPERDAL) 2 MG tablet Take 1 tablet (2 mg total) by mouth at bedtime.--must have office visit for further refills   [DISCONTINUED] risperiDONE (RISPERDAL) 2 MG tablet Take 1 tablet (2 mg total) by mouth daily.   No facility-administered encounter medications on file as of 02/02/2023.    Past Medical History:  Diagnosis Date   Anemia    Chest pain    Depression    Difficulty breathing     Frequent headaches    IBS (irritable bowel syndrome)    Palpitations    Poor circulation     Past Surgical History:  Procedure Laterality Date   CARPAL TUNNEL RELEASE Bilateral    SKIN GRAFT Bilateral     Family History  Problem Relation Age of Onset   Heart disease Father    Hypertension Father    Diabetes Father    Diabetes Paternal Grandmother    COPD Mother    Glaucoma Mother     Social History   Socioeconomic History   Marital status: Single    Spouse name: Not on file   Number of children: Not on file   Years of education: Not on file   Highest education level: Not on file  Occupational History   Not on file  Tobacco Use   Smoking status: Former    Packs/day: 0.50    Years: 35.00    Total pack years: 17.50    Types: Cigarettes    Quit date: 12/20/2022    Years since quitting: 0.1   Smokeless tobacco: Never   Tobacco comments:    [place] Quit now materials given.   Vaping Use   Vaping Use: Never used  Substance and Sexual Activity   Alcohol use: Not Currently    Comment: 2 months sober.   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   Sexual activity: Not Currently  Other Topics Concern   Not on file  Social History Narrative   Not on file   Social Determinants of Health   Financial Resource Strain: High Risk (01/11/2022)   Overall Financial Resource Strain (CARDIA)    Difficulty of Paying Living Expenses: Hard  Food Insecurity: No Food Insecurity (01/11/2022)   Hunger Vital Sign    Worried About Running Out of Food in the Last Year: Never true    Ran Out of Food in the Last Year: Never true  Transportation Needs: No Transportation Needs (01/11/2022)   PRAPARE - Hydrologist (Medical): No    Lack of Transportation (Non-Medical): No  Physical Activity: Inactive (01/11/2022)   Exercise Vital Sign    Days of Exercise per Week: 0 days    Minutes of Exercise per Session: 0 min  Stress: Stress  Concern Present (  01/11/2022)   [place]    Feeling of Stress : Very much  Social Connections: Socially Isolated (01/11/2022)   Social Connection and Isolation Panel [NHANES]    Frequency of Communication with Friends and Family: More than three times a week    Frequency of Social Gatherings with Friends and Family: Three times a week    Attends Religious Services: Never    Active Member of Clubs or Organizations: No    Attends Archivist Meetings: Never    Marital Status: Never married  Intimate Partner Violence: Not At Risk (01/11/2022)   Humiliation, Afraid, Rape, and Kick questionnaire    Fear of Current or Ex-Partner: No    Emotionally Abused: No    Physically Abused: No    Sexually Abused: No    Review of Systems  Constitutional:  Negative for chills, diaphoresis, fever, malaise/fatigue and weight loss.  HENT:  Positive for sore throat. Negative for congestion, ear discharge, ear pain, hearing loss, nosebleeds, sinus pain and tinnitus.   Eyes:  Negative for blurred vision, photophobia and redness.  Respiratory:  Positive for cough and shortness of breath. Negative for hemoptysis, sputum production, wheezing and stridor.        Hoarse and choking symptoms  Cardiovascular:  Negative for chest pain, palpitations, orthopnea, claudication, leg swelling and PND.  Gastrointestinal:  Negative for abdominal pain, blood in stool, constipation, diarrhea, heartburn, nausea and vomiting.  Genitourinary:  Negative for dysuria, flank pain, frequency, hematuria and urgency.  Musculoskeletal:  Negative for back pain, falls, joint pain, myalgias and neck pain.  Skin:  Negative for itching and Howard.  Neurological:  Negative for dizziness, tingling, tremors, sensory change, speech change, focal weakness, seizures, loss of consciousness, weakness and headaches.  Endo/Heme/Allergies:  Negative for environmental allergies and  polydipsia. Does not bruise/bleed easily.  Psychiatric/Behavioral:  Negative for depression, hallucinations, memory loss, substance abuse and suicidal ideas. The patient is not nervous/anxious and does not have insomnia.         Objective    BP 100/63 (BP Location: Left Arm, Patient Position: Sitting, Cuff Size: Normal)   Pulse 64   Temp 97.8 F (36.6 C) (Oral)   Resp (!) 24   Wt 170 lb (77.1 kg)   LMP 01/12/2023 (Approximate)   SpO2 97%   BMI 29.18 kg/m   Physical Exam Vitals reviewed.  Constitutional:      Appearance: Normal appearance. She is well-developed. She is obese. She is not diaphoretic.  HENT:     Head: Normocephalic and atraumatic.     Nose: No nasal deformity, septal deviation, mucosal edema or rhinorrhea.     Right Sinus: No maxillary sinus tenderness or frontal sinus tenderness.     Left Sinus: No maxillary sinus tenderness or frontal sinus tenderness.     Mouth/Throat:     Pharynx: No oropharyngeal exudate.     Comments: Poor dentition Eyes:     General: No scleral icterus.    Conjunctiva/sclera: Conjunctivae normal.     Pupils: Pupils are equal, round, and reactive to light.  Neck:     Thyroid: No thyromegaly.     Vascular: No carotid bruit or JVD.     Trachea: Trachea normal. No tracheal tenderness or tracheal deviation.  Cardiovascular:     Rate and Rhythm: Normal rate and regular rhythm.     Chest Wall: PMI is not displaced.     Pulses: Normal pulses. No decreased pulses.  Heart sounds: Normal heart sounds, S1 normal and S2 normal. Heart sounds not distant. No murmur heard.    No systolic murmur is present.     No diastolic murmur is present.     No friction rub. No gallop. No S3 or S4 sounds.  Pulmonary:     Effort: Pulmonary effort is normal. No tachypnea, accessory muscle usage or respiratory distress.     Breath sounds: No stridor. Wheezing present. No decreased breath sounds, rhonchi or rales.     Comments: Audible upper airway  inspiratory expiratory wheezing Chest:     Chest wall: No tenderness.  Abdominal:     General: Bowel sounds are normal. There is no distension.     Palpations: Abdomen is soft. Abdomen is not rigid.     Tenderness: There is no abdominal tenderness. There is no guarding or rebound.  Musculoskeletal:        General: Normal range of motion.     Cervical back: Normal range of motion and neck supple. No edema, erythema or rigidity. No muscular tenderness. Normal range of motion.  Lymphadenopathy:     Head:     Right side of head: No submental or submandibular adenopathy.     Left side of head: No submental or submandibular adenopathy.     Cervical: No cervical adenopathy.  Skin:    General: Skin is warm and dry.     Coloration: Skin is not pale.     Findings: No Howard.     Nails: There is no clubbing.  Neurological:     Mental Status: She is alert and oriented to person, place, and time.     Sensory: No sensory deficit.  Psychiatric:        Mood and Affect: Mood normal.        Speech: Speech normal.        Behavior: Behavior normal.        Thought Content: Thought content normal.         Assessment & Plan:   Problem List Items Addressed This Visit       Cardiovascular and Mediastinum   PVD (peripheral vascular disease) ([place])     Respiratory   COPD exacerbation ([place])    COPD currently not in exacerbation plan is to refill current inhaled medications refer to pulmonary medicine      Relevant Medications   budesonide-formoterol (SYMBICORT) 160-4.5 MCG/ACT inhaler   fluticasone (FLONASE) 50 MCG/ACT nasal spray   montelukast (SINGULAIR) 10 MG tablet   Other Relevant Orders   Ambulatory referral to Pulmonology   Vocal cord nodules    High risk for head and neck cancer will refer to ENT for repeat exam        Digestive   Chronic pancreatitis ([place])    Stable at this time      Relevant Medications   omeprazole (PRILOSEC) 40 MG capsule   Chronic hepatitis C without  hepatic coma ([place])     Endocrine   New onset type 2 diabetes mellitus ([place]) - Primary    Switch to glipizide      Relevant Medications   glipiZIDE (GLUCOTROL) 5 MG tablet   Other Relevant Orders   Urine microalbumin-creatinine with uACR   Renal function panel with eGFR   HgB A1c (Completed)   BMP8+eGFR     Nervous and Auditory   Neuropathy    Change gabapentin to 100 mg at bedtime        Other   Dyslipidemia  Continue statin      Former tobacco use    Congratulated patient former smoker      Anxiety state   Relevant Medications   gabapentin (NEURONTIN) 100 MG capsule   traZODone (DESYREL) 100 MG tablet   Cocaine dependence in remission ([place])   MDD (major depressive disorder), recurrent, severe, with psychosis ([place])    Have requested mental health referral have refilled patient's mental health medications      Relevant Medications   traZODone (DESYREL) 100 MG tablet   Bipolar affective disorder, current episode mixed, without psychotic features ([place])    Medications refilled and Dr[facility] Ronne Binning at [facility] agreed to see the patient on February 20 patient knows time to go      Other Visit Diagnoses     Hoarseness       Relevant Orders   Ambulatory referral to ENT   Vocal cord nodule       Relevant Orders   Ambulatory referral to ENT   Oropharyngeal dysphagia       Relevant Orders   Ambulatory referral to ENT   Encounter for screening mammogram for malignant neoplasm of breast       Relevant Orders   MM DIGITAL SCREENING BILATERAL     35 minutes spent reviewing old records examining patient providing education multiple prescriptions written Patient declined flu vaccine Return in about 3 months (around 05/03/2023) for copd, diabetes.   Asencion Noble, MD

## 2023-02-02 NOTE — Progress Notes (Unsigned)
Medication refill- albuterol and omeprazole Medication management would like medication bridged until she can be seen by [facility].  - gabapentin and buproprion   Mammogram referral

## 2023-02-03 ENCOUNTER — Encounter: Payer: Self-pay | Admitting: Critical Care Medicine

## 2023-02-03 ENCOUNTER — Telehealth: Payer: Self-pay

## 2023-02-03 ENCOUNTER — Other Ambulatory Visit (HOSPITAL_COMMUNITY): Payer: Self-pay

## 2023-02-03 DIAGNOSIS — J382 Nodules of vocal cords: Secondary | ICD-10-CM | POA: Insufficient documentation

## 2023-02-03 LAB — MICROALBUMIN / CREATININE URINE RATIO
Creatinine, Urine: 53.9 mg/dL
Microalb/Creat Ratio: 6 mg/g creat (ref 0–29)
Microalbumin, Urine: 3.4 ug/mL

## 2023-02-03 LAB — BMP8+EGFR
BUN/Creatinine Ratio: 18 (ref 9–23)
BUN: 10 mg/dL (ref 6–24)
CO2: 17 mmol/L — ABNORMAL LOW (ref 20–29)
Calcium: 8.8 mg/dL (ref 8.7–10.2)
Chloride: 101 mmol/L (ref 96–106)
Creatinine, Ser: 0.55 mg/dL — ABNORMAL LOW (ref 0.57–1.00)
Glucose: 200 mg/dL — ABNORMAL HIGH (ref 70–99)
Potassium: 4.3 mmol/L (ref 3.5–5.2)
Sodium: 140 mmol/L (ref 134–144)
eGFR: 112 mL/min/{1.73_m2} (ref >59)

## 2023-02-03 LAB — RENAL FUNCTION PANEL
Albumin: 4.2 g/dL (ref 3.9–4.9)
Phosphorus: 3.4 mg/dL (ref 3.0–4.3)

## 2023-02-03 NOTE — Progress Notes (Signed)
Let pt know blood sugar was high take the new blood sugar med as prescribed

## 2023-02-03 NOTE — Telephone Encounter (Signed)
Pt was called and is aware of results, DOB was confirmed.  ?

## 2023-02-03 NOTE — Assessment & Plan Note (Signed)
Medications refilled and Dr. Ronne Binning at [facility] agreed to see the patient on February 20 patient knows time to go

## 2023-02-03 NOTE — Assessment & Plan Note (Signed)
COPD currently not in exacerbation plan is to refill current inhaled medications refer to pulmonary medicine

## 2023-02-03 NOTE — Assessment & Plan Note (Signed)
High risk for head and neck cancer will refer to ENT for repeat exam

## 2023-02-03 NOTE — Assessment & Plan Note (Signed)
Stable at this time 

## 2023-02-03 NOTE — Assessment & Plan Note (Signed)
Change gabapentin to 100 mg at bedtime

## 2023-02-03 NOTE — Assessment & Plan Note (Signed)
Congratulated patient former smoker

## 2023-02-03 NOTE — Telephone Encounter (Signed)
-----   Message from Elsie Stain, MD sent at 02/03/2023  8:34 AM EST ----- Let pt know blood sugar was high take the new blood sugar med as prescribed

## 2023-02-03 NOTE — Assessment & Plan Note (Signed)
Switch to glipizide

## 2023-02-03 NOTE — Assessment & Plan Note (Signed)
Have requested mental health referral have refilled patient's mental health medications

## 2023-02-03 NOTE — Assessment & Plan Note (Signed)
Continue statin. 

## 2023-02-08 ENCOUNTER — Ambulatory Visit (HOSPITAL_COMMUNITY): Payer: Self-pay | Admitting: Psychiatry

## 2023-02-14 DIAGNOSIS — M7989 Other specified soft tissue disorders: Secondary | ICD-10-CM

## 2023-02-14 NOTE — Progress Notes (Signed)
Pt came in to buy another pair of thigh high hose. She states she purchased a pair at her last appt and has stretched them out by putting them in washer and dryer. She tried on a size small and felt they were a good fit. Pt was in between a small and med but wanted to keep the small. She will call us if she has any questions/concerns.

## 2023-02-15 ENCOUNTER — Ambulatory Visit (HOSPITAL_COMMUNITY): Payer: Medicaid Other | Admitting: Psychiatry

## 2023-02-28 ENCOUNTER — Ambulatory Visit (INDEPENDENT_AMBULATORY_CARE_PROVIDER_SITE_OTHER): Payer: Medicaid Other | Admitting: Psychiatry

## 2023-02-28 ENCOUNTER — Encounter (HOSPITAL_COMMUNITY): Payer: Self-pay | Admitting: Psychiatry

## 2023-02-28 ENCOUNTER — Other Ambulatory Visit (HOSPITAL_COMMUNITY): Payer: Self-pay

## 2023-02-28 ENCOUNTER — Other Ambulatory Visit: Payer: Self-pay

## 2023-02-28 DIAGNOSIS — F411 Generalized anxiety disorder: Secondary | ICD-10-CM | POA: Diagnosis not present

## 2023-02-28 DIAGNOSIS — F3160 Bipolar disorder, current episode mixed, unspecified: Secondary | ICD-10-CM

## 2023-02-28 MED ORDER — GABAPENTIN 100 MG PO CAPS
100.0000 mg | ORAL_CAPSULE | Freq: Every day | ORAL | 3 refills | Status: DC
  Filled 2023-02-28 – 2023-05-09 (×2): qty 90, 90d supply, fill #0

## 2023-02-28 MED ORDER — TRAZODONE HCL 100 MG PO TABS
100.0000 mg | ORAL_TABLET | Freq: Every evening | ORAL | 3 refills | Status: DC | PRN
  Filled 2023-02-28 – 2023-04-21 (×2): qty 90, 90d supply, fill #0

## 2023-02-28 MED ORDER — RISPERIDONE 3 MG PO TABS
3.0000 mg | ORAL_TABLET | Freq: Every day | ORAL | 3 refills | Status: DC
  Filled 2023-02-28 – 2023-03-07 (×2): qty 30, 30d supply, fill #0
  Filled 2023-03-29: qty 30, 30d supply, fill #1
  Filled 2023-05-09: qty 30, 30d supply, fill #2

## 2023-02-28 NOTE — Progress Notes (Signed)
[facility] MD/PA/NP OP Progress Note Virtual Visit via Video Note  I connected with Candace Howard on 02/28/23 at 11:00 AM EDT by a video enabled telemedicine application and verified that I am speaking with the correct person using two identifiers.  Location: Patient: Work Provider: Clinic   I discussed the limitations of evaluation and management by telemedicine and the availability of in person appointments. The patient expressed understanding and agreed to proceed.  I provided 30 minutes of non-face-to-face time during this encounter.   02/28/2023 11:12 AM Candace Howard  MRN:  [id number]  Chief Complaint: "I need medication management"  HPI: 52 year old female seen today for follow-up psychiatric evaluation.  Patient has not been seen by writer in over a year.  She has a psychiatric history ofsubstance use (Alcohol, Tobacco, meth, and cocaine) Bipolar affective disorder, Depression, anxiety, and PTSD.  Currently she is being managed on Risperdal 2 mg nightly, gabapentin 100 mg 3 times daily (reports that she became addicted to higher doses), Seroquel 75 to 150 mg nightly as needed, and trazodone 100 mg nightly as needed.  She informed Probation officer that she never started Seroquel.  Today she reports her medications are effective in managing her psychiatric condition.  Today she was well-groomed, pleasant, cooperative, and engaged in conversation.  Patient notes that she is in need of medication management since returning to [facility].  She informed Probation officer that her PCP filled her psychiatric medication.  Patient informed Probation officer that she cannot function without her medications and feels mentally stable.  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  She does note that she worries about her son however who recently started using illegal substances.  She notes that she and her son live together which is concerning.  Today provider conducted  a GAD-7 and patient scored an 8.  Provider also conducted PHQ-9 and patient scored a 4.  At times she notes that she is irritable, distractible, has racing thoughts, and fluctuations in mood.  Today she denies SI/HI/VAH or paranoia.  Patient reports that her sleep is affected by her stress level.  She notes that she attempts to get 6 hours of sleep nightly.  He reports that she has an adequate appetite.  Patient informed Probation officer that she has been working as a traffic control person.  She notes that she finds enjoyment in her job.  Patient informed Probation officer that she never started Seroquel and at this time does not wish to restart it.  She is agreeable to increasing Risperdal 2 mg to 3 mg to help manage mood and sleep.  She will continue her other medications as prescribed.  No other concerns noted at this time.   Visit Diagnosis:    ICD-10-CM   1. Bipolar affective disorder, current episode mixed, without psychotic features ([place])  F31.60 traZODone (DESYREL) 100 MG tablet    gabapentin (NEURONTIN) 100 MG capsule    risperiDONE (RISPERDAL) 3 MG tablet    2. Anxiety state  F41.1 traZODone (DESYREL) 100 MG tablet    gabapentin (NEURONTIN) 100 MG capsule    risperiDONE (RISPERDAL) 3 MG tablet      Past Psychiatric History: substance use (Alcohol, Tobacco, meth, and cocaine) Bipolar affective disorder, Depression, anxiety, and PTSD  Past Medical History:  Past Medical History:  Diagnosis Date   Anemia    Chest pain    Depression    Difficulty breathing    Frequent headaches  IBS (irritable bowel syndrome)    Palpitations    Poor circulation     Past Surgical History:  Procedure Laterality Date   CARPAL TUNNEL RELEASE Bilateral    SKIN GRAFT Bilateral     Family Psychiatric History:  Son Substance use, maternal 4 uncles SI, mother abuse of pill use   Family History:  Family History  Problem Relation Age of Onset   Heart disease Father    Hypertension Father    Diabetes Father     Diabetes Paternal Grandmother    COPD Mother    Glaucoma Mother     Social History:  Social History   Socioeconomic History   Marital status: Single    Spouse name: Not on file   Number of children: Not on file   Years of education: Not on file   Highest education level: Not on file  Occupational History   Not on file  Tobacco Use   Smoking status: Former    Packs/day: 0.50    Years: 35.00    Total pack years: 17.50    Types: Cigarettes    Quit date: 12/20/2022    Years since quitting: 0.1   Smokeless tobacco: Never   Tobacco comments:    [place] Quit now materials given.   Vaping Use   Vaping Use: Never used  Substance and Sexual Activity   Alcohol use: Not Currently    Comment: 2 months sober.   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   Sexual activity: Not Currently  Other Topics Concern   Not on file  Social History Narrative   Not on file   Social Determinants of Health   Financial Resource Strain: High Risk (01/11/2022)   Overall Financial Resource Strain (CARDIA)    Difficulty of Paying Living Expenses: Hard  Food Insecurity: No Food Insecurity (01/11/2022)   Hunger Vital Sign    Worried About Running Out of Food in the Last Year: Never true    Ran Out of Food in the Last Year: Never true  Transportation Needs: No Transportation Needs (01/11/2022)   PRAPARE - Hydrologist (Medical): No    Lack of Transportation (Non-Medical): No  Physical Activity: Inactive (01/11/2022)   Exercise Vital Sign    Days of Exercise per Week: 0 days    Minutes of Exercise per Session: 0 min  Stress: Stress Concern Present (01/11/2022)   [place]    Feeling of Stress : Very much  Social Connections: Socially Isolated (01/11/2022)   Social Connection and Isolation Panel [NHANES]    Frequency of Communication with Friends and Family: More than three times a  week    Frequency of Social Gatherings with Friends and Family: Three times a week    Attends Religious Services: Never    Active Member of Clubs or Organizations: No    Attends Archivist Meetings: Never    Marital Status: Never married    Allergies:  Allergies  Allergen Reactions   Hydrocodone     Other reaction(s): Unknown/See Comments "Feel really high"    Metabolic Disorder Labs: Lab Results  Component Value Date   HGBA1C 7.1 (A) 02/02/2023   MPG 108.28 12/25/2021   No results found for: "PROLACTIN" Lab Results  Component Value Date   CHOL 182 12/27/2021   TRIG 114 12/27/2021   HDL 49 12/27/2021   CHOLHDL 3.7 12/27/2021  VLDL 23 12/27/2021   LDLCALC 110 (H) 12/27/2021   LDLCALC 130 (H) 12/25/2021   Lab Results  Component Value Date   TSH 0.636 12/25/2021    Therapeutic Level Labs: No results found for: "LITHIUM" No results found for: "VALPROATE" No results found for: "CBMZ"  Current Medications: Current Outpatient Medications  Medication Sig Dispense Refill   Accu-Chek Softclix Lancets lancets Use as instructed 100 each 12   ALBUTEROL IN Inhale into the lungs.     Blood Glucose Monitoring Suppl (ACCU-CHEK GUIDE) w/Device KIT use as directed once daily 1 kit 0   budesonide-formoterol (SYMBICORT) 160-4.5 MCG/ACT inhaler Inhale 2 puffs into the lungs 2 (two) times daily. 10.2 g 3   fluticasone (FLONASE) 50 MCG/ACT nasal spray Insert 1 spray into each nostril 2 times a day 16 g 3   gabapentin (NEURONTIN) 100 MG capsule Take 1 capsule (100 mg total) by mouth at bedtime. 90 capsule 3   glipiZIDE (GLUCOTROL) 5 MG tablet Take 1 tablet (5 mg total) by mouth 2 (two) times daily before a meal. 60 tablet 3   glucose blood (ACCU-CHEK GUIDE) test strip use  2 (two) times daily as instructed 100 each 12   ipratropium-albuterol (DUONEB) 0.5-2.5 (3) MG/3ML SOLN Take 3 mLs by nebulization every 6 (six) hours as needed. 120 mL 0   montelukast (SINGULAIR) 10 MG  tablet Take 1 tablet (10 mg total) by mouth daily. Needs office visit. 60 tablet 2   omeprazole (PRILOSEC) 40 MG capsule Take 1 capsule (40 mg total) by mouth at bedtime. 60 capsule 2   podofilox (CONDYLOX) 0.5 % external solution Apply topically every 12 hours in the morning and evening for 3 days, then withhold for 4 days; repeat cycle up to 4 times 3.5 mL 0   risperiDONE (RISPERDAL) 3 MG tablet Take 1 tablet (3 mg total) by mouth daily. 30 tablet 3   traZODone (DESYREL) 100 MG tablet Take 1 tablet (100 mg total) by mouth at bedtime as needed for sleep. 90 tablet 3   No current facility-administered medications for this visit.     Musculoskeletal: Strength & Muscle Tone: within normal limits telehealth visit Gait & Station: normal telehealth visit Patient leans: N/A  Psychiatric Specialty Exam: Review of Systems  Last menstrual period 01/12/2023.There is no height or weight on file to calculate BMI.  General Appearance: Well Groomed  Eye Contact:  Good  Speech:  Clear and Coherent and Normal Rate  Volume:  Normal  Mood:  Euthymic  Affect:  Appropriate and Congruent  Thought Process:  Coherent, Goal Directed, and Linear  Orientation:  Full (Time, Place, and Person)  Thought Content: WDL   Suicidal Thoughts:  No  Homicidal Thoughts:  No  Memory:  Immediate;   Good Recent;   Good Remote;   Good  Judgement:  Good  Insight:  Good  Psychomotor Activity:  Normal  Concentration:  Concentration: Good and Attention Span: Good  Recall:  Good  Fund of Knowledge: Good  Language: Good  Akathisia:  No  Handed:  Left  AIMS (if indicated): not done  Assets:  Communication Skills Desire for Improvement Financial Resources/Insurance Housing Physical Health Social Support  ADL's:  Intact  Cognition: WNL  Sleep:  Fair   Screenings: AIMS    [place] from 01/11/2022 in [facility] Admission (Discharged) from 12/26/2021 in [place] [facility]  AIMS Total Score 0 0      AUDIT  Flowsheet Row Admission (Discharged) from 12/26/2021 in [place] [facility]  Alcohol Use Disorder Identification Test Final Score (AUDIT) 0      GAD-7    Flowsheet Row Office Visit from 02/28/2023 in [facility] Office Visit from 02/02/2023 in [place] Office Visit from 04/27/2022 in [place] Office Visit from 04/01/2022 in [facility] [place] Visit from 01/20/2022 in [place]  Total GAD-7 Score '8 3 3 6 11      '$ PHQ2-9    [place] Office Visit from 02/28/2023 in [facility] Office Visit from 02/02/2023 in [place] Office Visit from 04/28/2022 in [facility] Office Visit from 04/27/2022 in [place] Office Visit from 04/01/2022 in [place] [facility]  PHQ-2 Total Score 1 0 0 1 1  PHQ-9 Total Score 4 7 -- 3 8      [place] Office Visit from 02/28/2023 in [facility] from 01/11/2022 in [facility] Admission (Discharged) from 12/26/2021 in [place] [facility]  C-SSRS RISK CATEGORY Low Risk High Risk No Risk        Assessment and Plan: Patient informed writer that time she is irritable, distractible, has fluctuations in mood.  She notes that her sleep is affected by her stress level.  Today she is agreeable to increasing Risperdal 2 mg to 3 mg to help manage mood.  At this time Seroquel not restarted as patient has not taken it.  She will continue her other medications as prescribed.  1. Anxiety state  Continue- traZODone (DESYREL) 100 MG tablet; Take 1 tablet (100 mg  total) by mouth at bedtime as needed for sleep.  Dispense: 90 tablet; Refill: 3 Continue- gabapentin (NEURONTIN) 100 MG capsule; Take 1 capsule (100 mg total) by mouth at bedtime.  Dispense: 90 capsule; Refill: 3 Increased- risperiDONE (RISPERDAL) 3 MG tablet; Take 1 tablet (3 mg total) by mouth daily.  Dispense: 30 tablet; Refill: 3  2. Bipolar affective disorder, current episode mixed, without psychotic features ([place])  Continue- traZODone (DESYREL) 100 MG tablet; Take 1 tablet (100 mg total) by mouth at bedtime as needed for sleep.  Dispense: 90 tablet; Refill: 3 Continue- gabapentin (NEURONTIN) 100 MG capsule; Take 1 capsule (100 mg total) by mouth at bedtime.  Dispense: 90 capsule; Refill: 3 Increased- risperiDONE (RISPERDAL) 3 MG tablet; Take 1 tablet (3 mg total) by mouth daily.  Dispense: 30 tablet; Refill: 3   Collaboration of Care: Collaboration of Care: Other provider involved in patient's care AEB PCP  Patient/Guardian was advised Release of Information must be obtained prior to any record release in order to collaborate their care with an outside provider. Patient/Guardian was advised if they have not already done so to contact the registration department to sign all necessary forms in order for [place] to release information regarding their care.   Consent: Patient/Guardian gives verbal consent for treatment and assignment of benefits for services provided during this visit. Patient/Guardian expressed understanding and agreed to proceed.   Follow-up in 3 months Salley Slaughter, NP 02/28/2023, 11:12 AM

## 2023-03-01 ENCOUNTER — Ambulatory Visit (HOSPITAL_COMMUNITY): Payer: Medicaid Other | Admitting: Psychiatry

## 2023-03-04 ENCOUNTER — Other Ambulatory Visit: Payer: Self-pay

## 2023-03-06 IMAGING — DX DG ANKLE COMPLETE 3+V*L*
3 series · 3 of 3 positions shown · non-contrast
Comparison: Left ankle x-ray 09/29/2021.

CLINICAL DATA: Pain.

EXAM:
LEFT ANKLE COMPLETE - 3+ VIEW; LEFT FOOT - COMPLETE 3+ VIEW

[ankle ap]
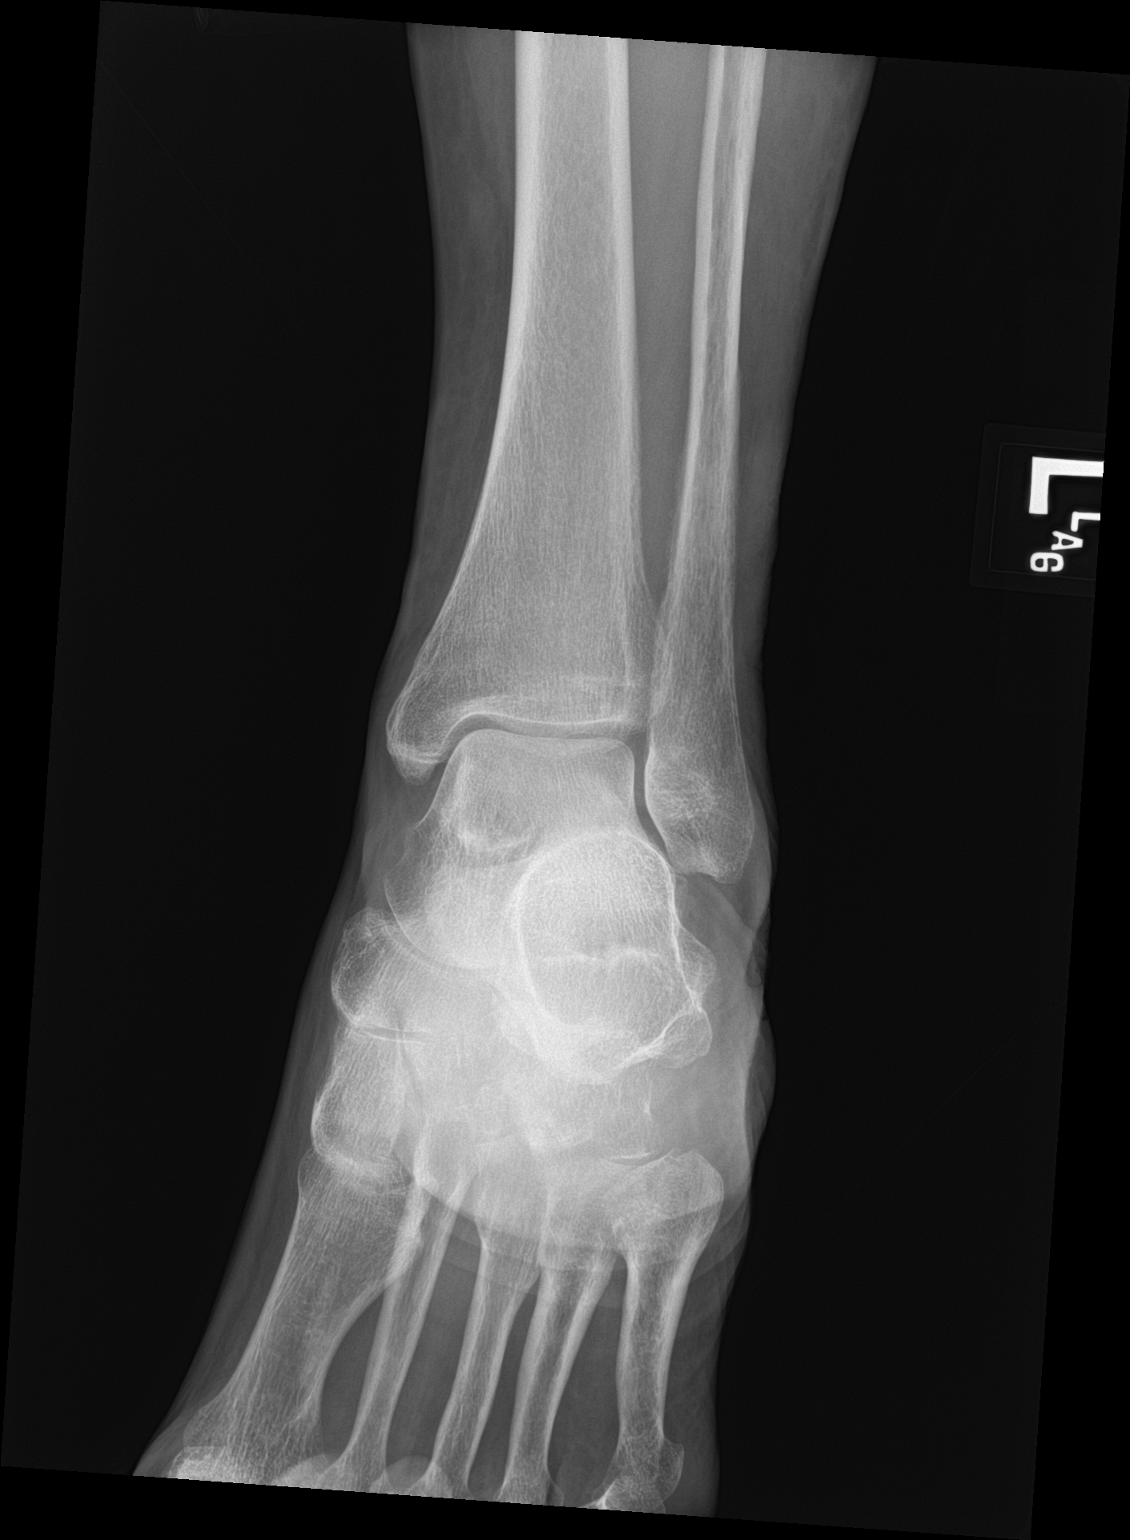

[ankle obl]
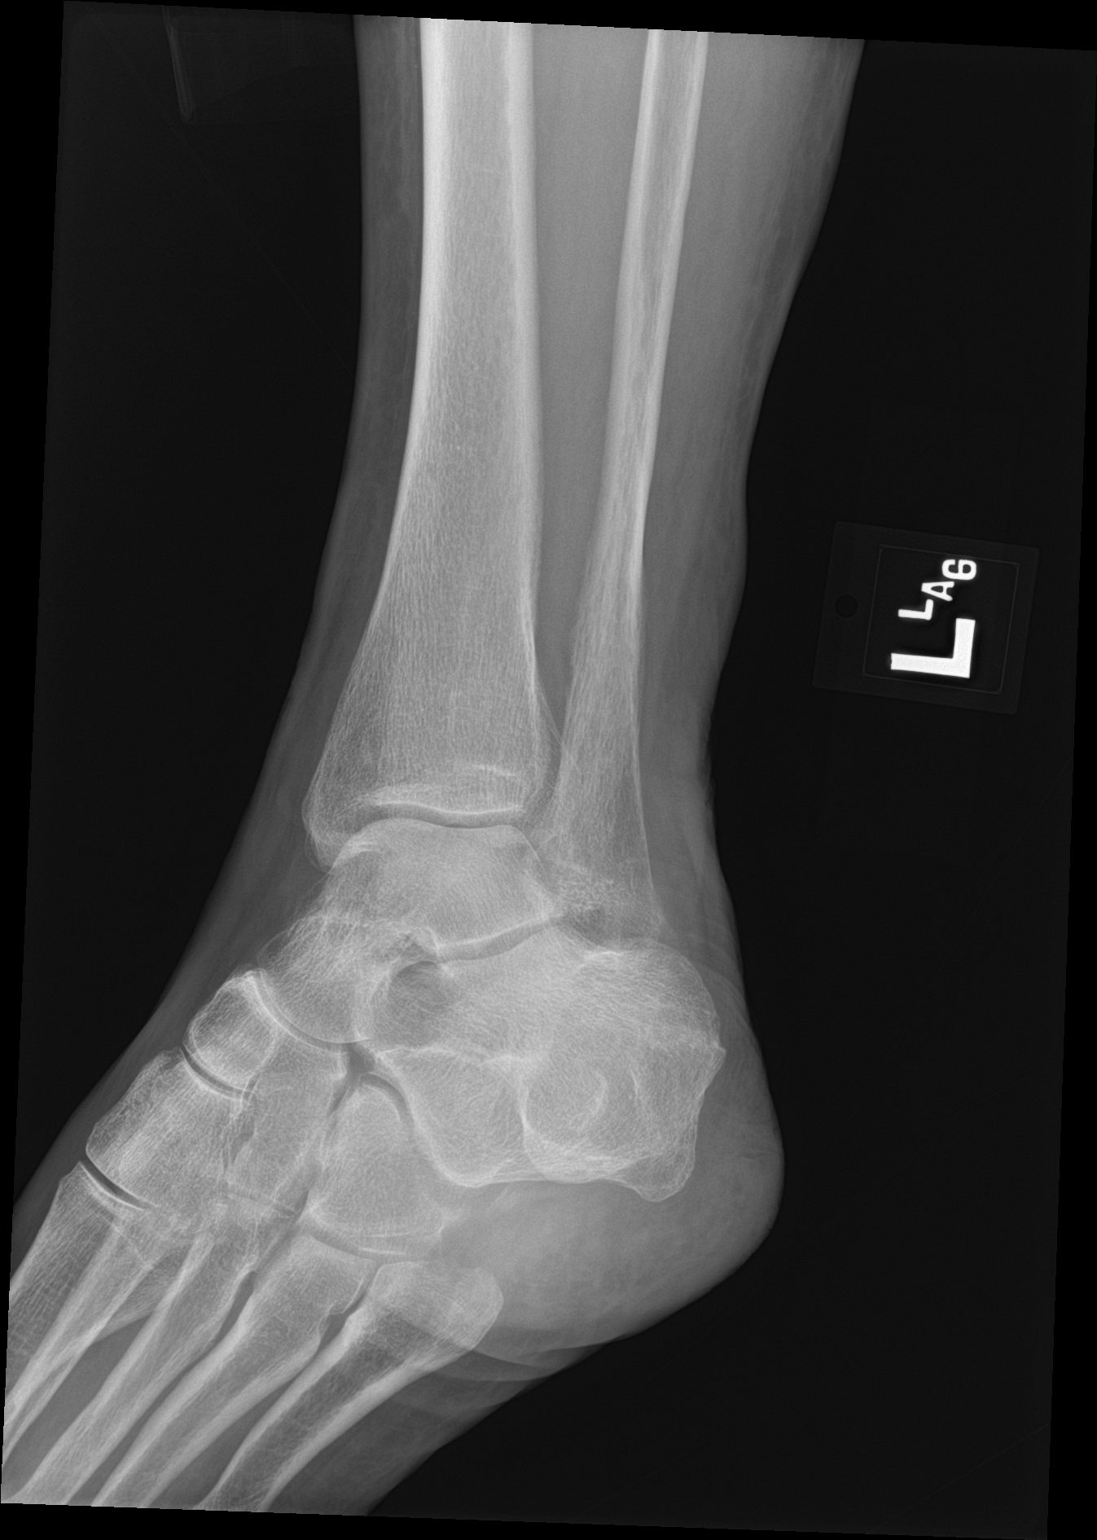

[ankle lat]
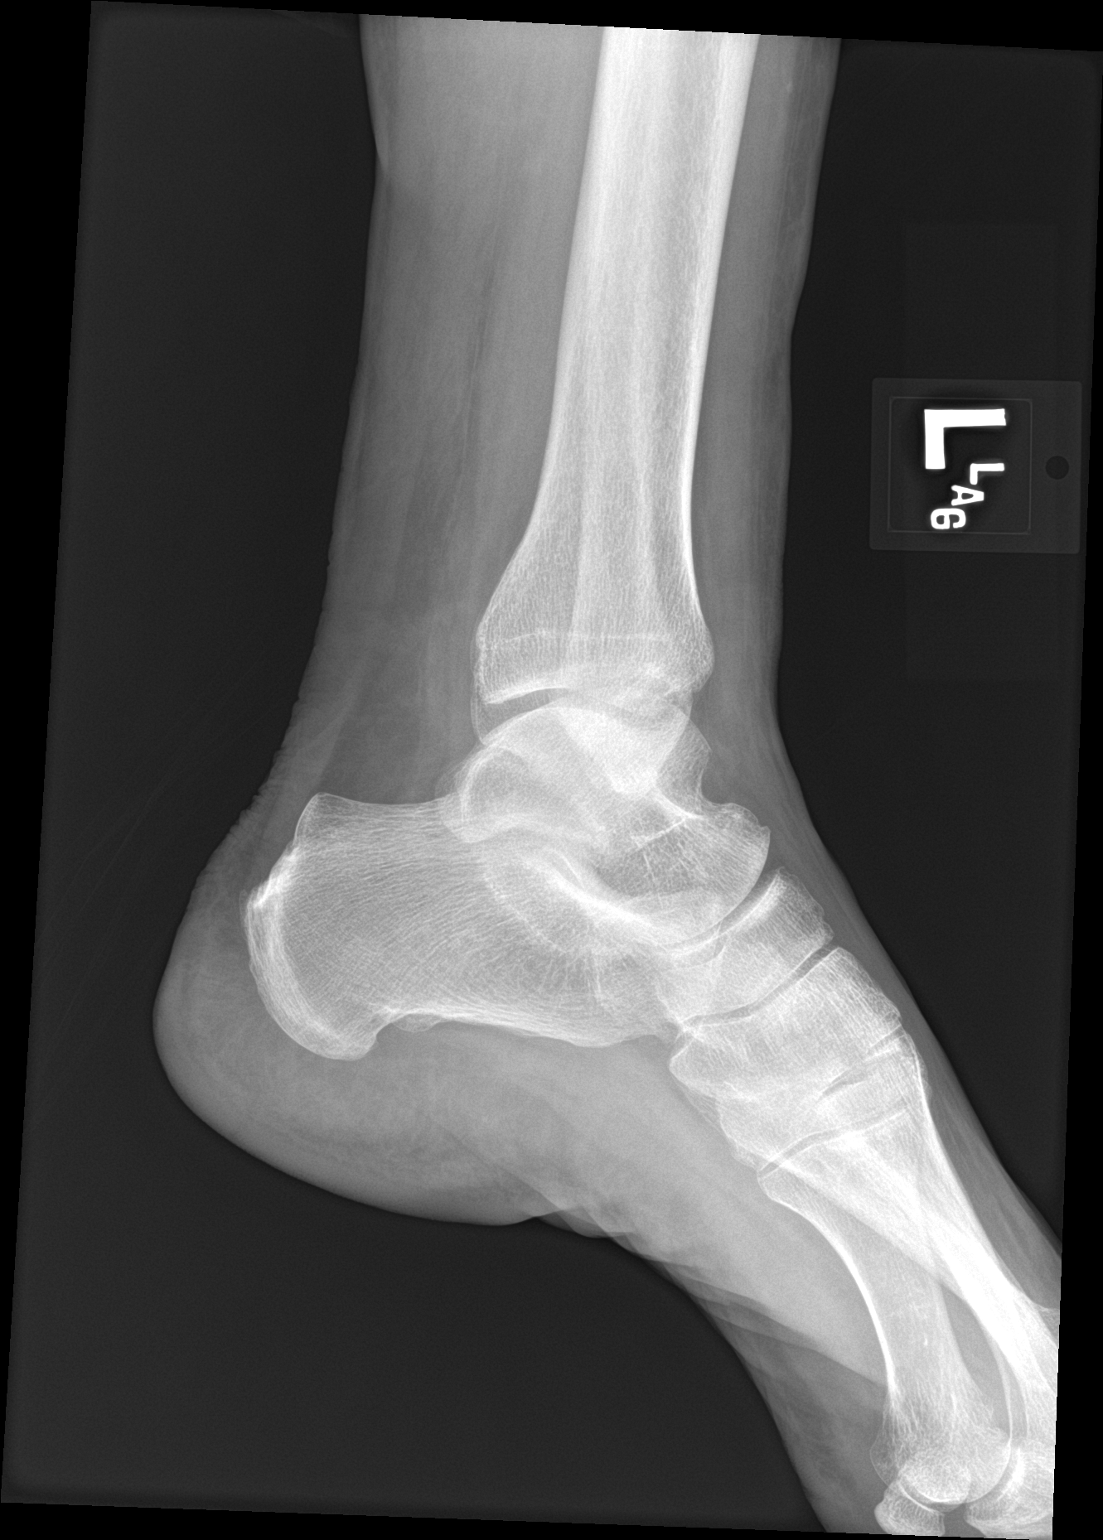

[3 of 3 positions shown; findings below may reference images not displayed]

FINDINGS: Left ankle: There is no evidence of fracture, dislocation, or joint
effusion. There is no evidence of arthropathy or other focal bone
abnormality. Soft tissues are unremarkable.

Left foot: There is no evidence of fracture, dislocation, or joint
effusion. There is no evidence of arthropathy or other focal bone
abnormality. Soft tissues are unremarkable.
IMPRESSION: Negative left foot and left ankle.

## 2023-03-07 ENCOUNTER — Other Ambulatory Visit (HOSPITAL_COMMUNITY): Payer: Self-pay

## 2023-03-07 ENCOUNTER — Other Ambulatory Visit: Payer: Self-pay

## 2023-03-09 IMAGING — CR DG CHEST 2V
2 series · 2 of 2 positions shown · non-contrast
Comparison: None.

CLINICAL DATA: Nonproductive cough.

EXAM:
CHEST - 2 VIEW

[w chest pa]
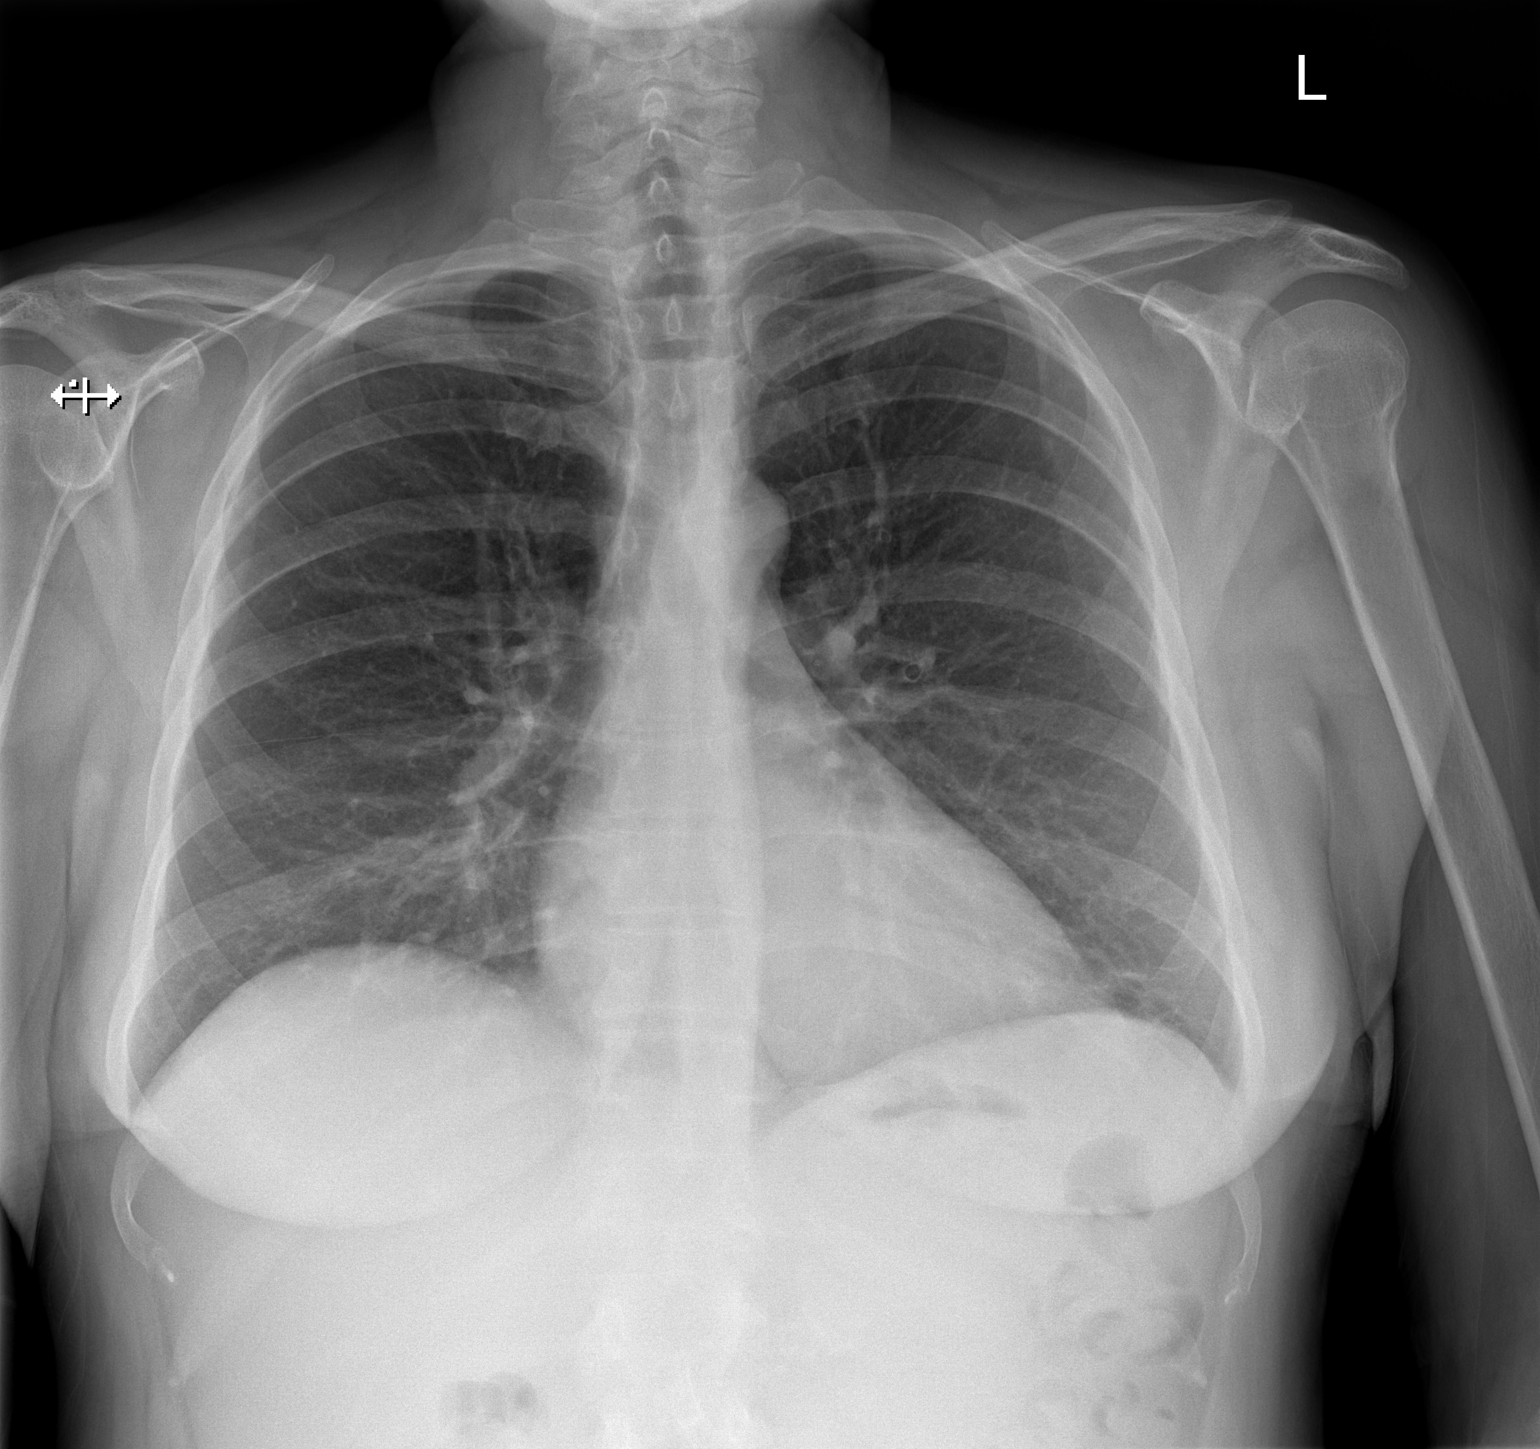

[w chest lat]
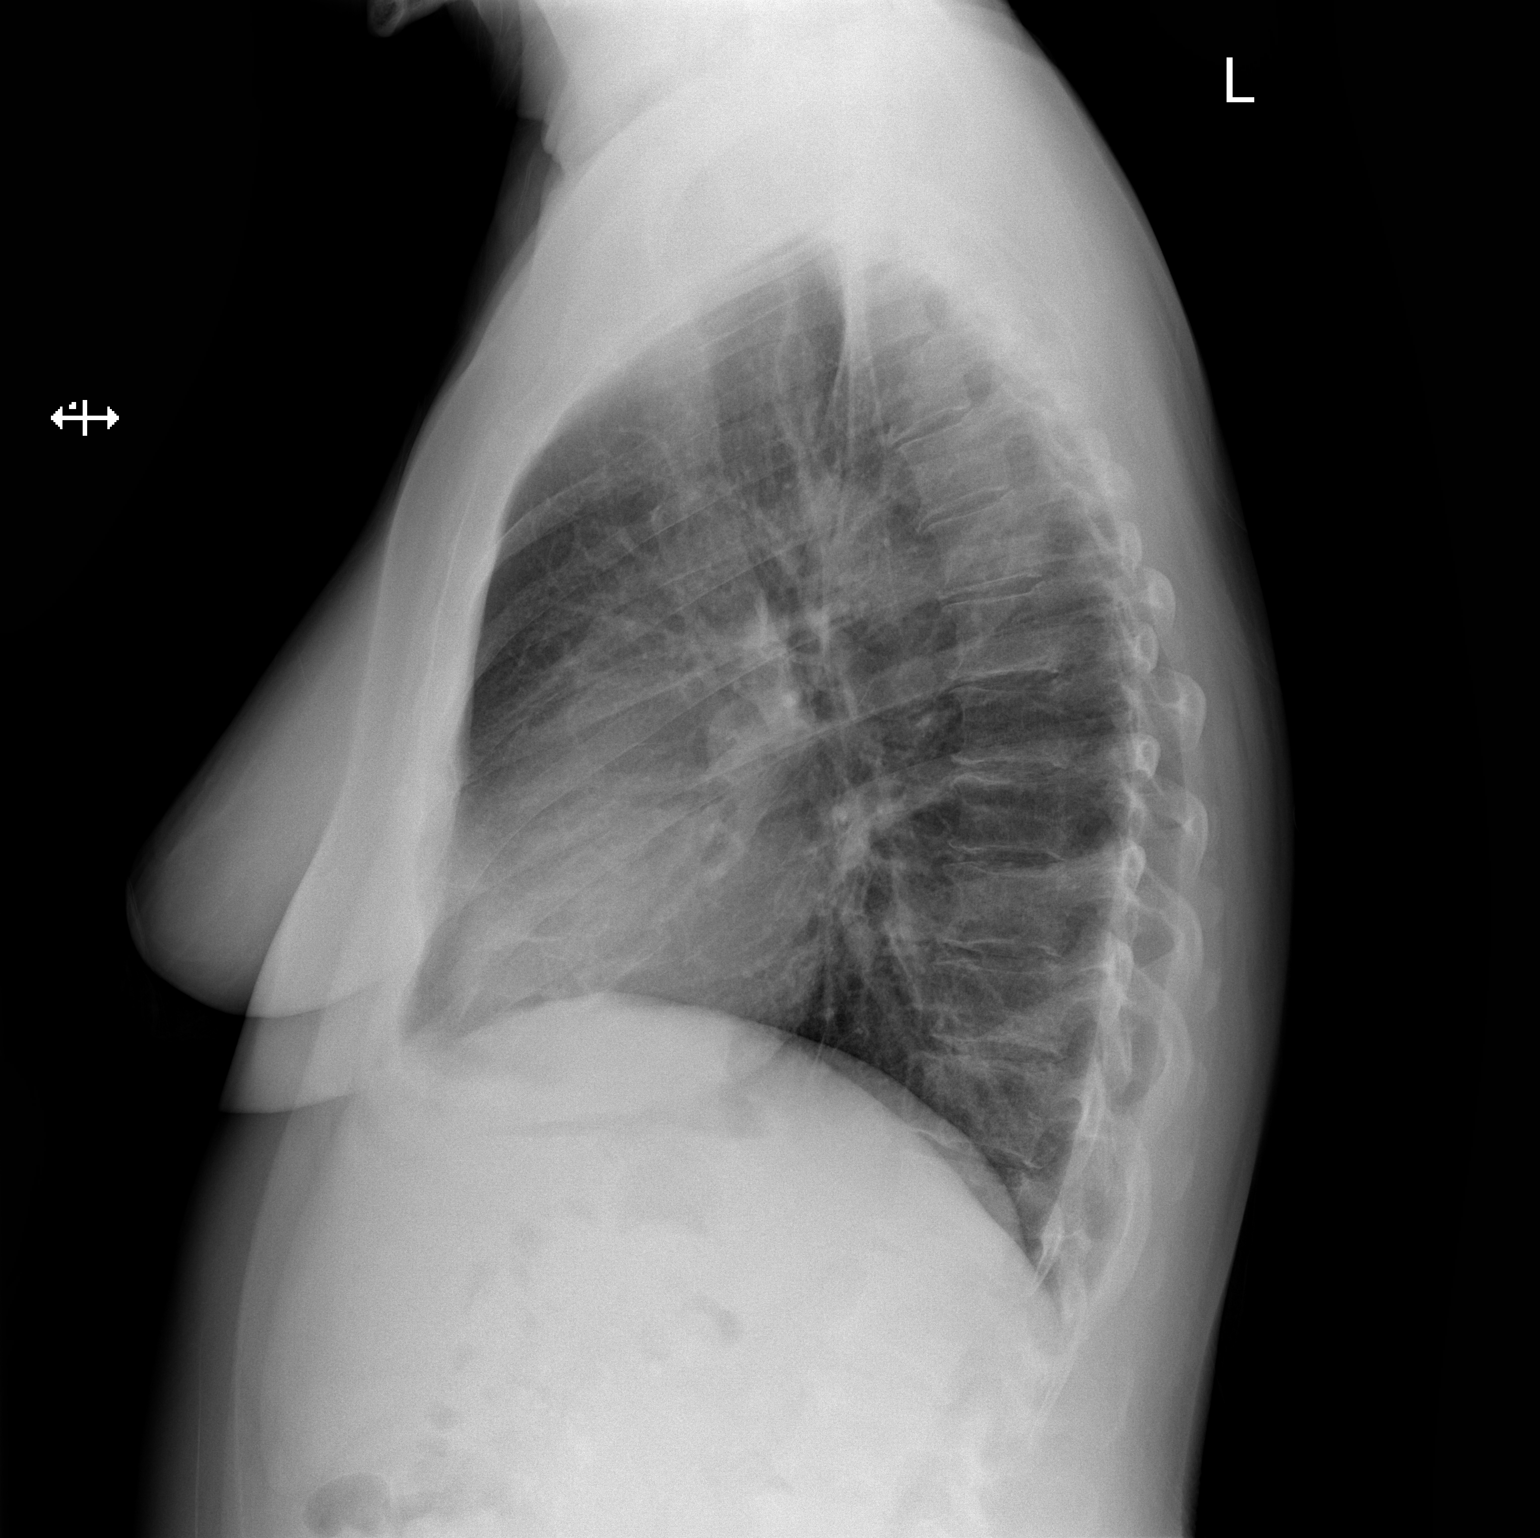

[2 of 2 positions shown; findings below may reference images not displayed]

FINDINGS: The heart size and mediastinal contours are within normal limits.
Both lungs are clear. The visualized skeletal structures are
unremarkable.
IMPRESSION: No active cardiopulmonary disease.

## 2023-03-17 ENCOUNTER — Ambulatory Visit
Admission: RE | Admit: 2023-03-17 | Discharge: 2023-03-17 | Disposition: A | Discharge status: Home / Self Care | Payer: Medicaid Other | Source: Ambulatory Visit | Attending: Critical Care Medicine | Admitting: Critical Care Medicine

## 2023-03-17 ENCOUNTER — Other Ambulatory Visit: Payer: Self-pay | Admitting: Critical Care Medicine

## 2023-03-17 DIAGNOSIS — F1421 Cocaine dependence, in remission: Secondary | ICD-10-CM

## 2023-03-17 DIAGNOSIS — R1312 Dysphagia, oropharyngeal phase: Secondary | ICD-10-CM

## 2023-03-17 DIAGNOSIS — Z1231 Encounter for screening mammogram for malignant neoplasm of breast: Secondary | ICD-10-CM

## 2023-03-17 DIAGNOSIS — J441 Chronic obstructive pulmonary disease with (acute) exacerbation: Secondary | ICD-10-CM

## 2023-03-17 DIAGNOSIS — E119 Type 2 diabetes mellitus without complications: Secondary | ICD-10-CM

## 2023-03-17 DIAGNOSIS — I739 Peripheral vascular disease, unspecified: Secondary | ICD-10-CM

## 2023-03-17 DIAGNOSIS — J382 Nodules of vocal cords: Secondary | ICD-10-CM

## 2023-03-17 DIAGNOSIS — R490 Dysphonia: Secondary | ICD-10-CM

## 2023-03-17 DIAGNOSIS — Z23 Encounter for immunization: Secondary | ICD-10-CM

## 2023-03-17 DIAGNOSIS — E785 Hyperlipidemia, unspecified: Secondary | ICD-10-CM

## 2023-03-17 DIAGNOSIS — G629 Polyneuropathy, unspecified: Secondary | ICD-10-CM

## 2023-03-17 DIAGNOSIS — F17200 Nicotine dependence, unspecified, uncomplicated: Secondary | ICD-10-CM

## 2023-03-17 DIAGNOSIS — F3160 Bipolar disorder, current episode mixed, unspecified: Secondary | ICD-10-CM

## 2023-03-17 DIAGNOSIS — K860 Alcohol-induced chronic pancreatitis: Secondary | ICD-10-CM

## 2023-03-17 DIAGNOSIS — F411 Generalized anxiety disorder: Secondary | ICD-10-CM

## 2023-03-17 DIAGNOSIS — F333 Major depressive disorder, recurrent, severe with psychotic symptoms: Secondary | ICD-10-CM

## 2023-03-17 DIAGNOSIS — B182 Chronic viral hepatitis C: Secondary | ICD-10-CM

## 2023-03-17 DIAGNOSIS — Z87891 Personal history of nicotine dependence: Secondary | ICD-10-CM

## 2023-03-18 ENCOUNTER — Telehealth (HOSPITAL_COMMUNITY): Payer: Medicaid Other | Admitting: Psychiatry

## 2023-03-21 NOTE — Progress Notes (Signed)
Let pt know mammo normal. Rechk one year

## 2023-03-22 ENCOUNTER — Telehealth (HOSPITAL_COMMUNITY): Payer: Medicaid Other | Admitting: Psychiatry

## 2023-03-22 ENCOUNTER — Telehealth: Payer: Self-pay

## 2023-03-22 NOTE — Telephone Encounter (Signed)
Pt was called and vm was left, Information has been sent to nurse pool.   

## 2023-03-22 NOTE — Telephone Encounter (Signed)
-----   Message from Elsie Stain, MD sent at 03/21/2023  3:12 PM EDT ----- Let pt know mammo normal. Rechk one year

## 2023-03-30 ENCOUNTER — Other Ambulatory Visit (HOSPITAL_COMMUNITY): Payer: Self-pay

## 2023-03-31 ENCOUNTER — Encounter: Payer: Self-pay | Admitting: Pulmonary Disease

## 2023-03-31 ENCOUNTER — Ambulatory Visit (INDEPENDENT_AMBULATORY_CARE_PROVIDER_SITE_OTHER): Payer: Medicaid Other | Admitting: Pulmonary Disease

## 2023-03-31 VITALS — BP 128/74 | HR 84 | Ht 64.0 in | Wt 168.0 lb

## 2023-03-31 DIAGNOSIS — J449 Chronic obstructive pulmonary disease, unspecified: Secondary | ICD-10-CM

## 2023-03-31 DIAGNOSIS — F17210 Nicotine dependence, cigarettes, uncomplicated: Secondary | ICD-10-CM

## 2023-03-31 DIAGNOSIS — R079 Chest pain, unspecified: Secondary | ICD-10-CM | POA: Diagnosis not present

## 2023-03-31 MED ORDER — ALBUTEROL SULFATE HFA 108 (90 BASE) MCG/ACT IN AERS
2.0000 | INHALATION_SPRAY | Freq: Four times a day (QID) | RESPIRATORY_TRACT | 6 refills | Status: DC | PRN
  Filled 2023-03-31: qty 18, 25d supply, fill #0

## 2023-03-31 MED ORDER — NAPROXEN 500 MG PO TABS
500.0000 mg | ORAL_TABLET | Freq: Two times a day (BID) | ORAL | 0 refills | Status: AC
  Filled 2023-03-31: qty 14, 7d supply, fill #0

## 2023-03-31 MED ORDER — SPIRIVA RESPIMAT 2.5 MCG/ACT IN AERS
2.0000 | INHALATION_SPRAY | Freq: Every day | RESPIRATORY_TRACT | 11 refills | Status: DC
  Filled 2023-03-31: qty 4, 30d supply, fill #0

## 2023-03-31 NOTE — Patient Instructions (Addendum)
Nice to meet you  I refilled your rescue inhaler albuterol  Continue Symbicort 2 puffs twice a day every day  Take a new medicine called Stiolto 2 puffs once a day.    For the chest discomfort, take naproxen 1 tablet twice a day for 7 days  If the chest pain continues, you can get over-the-counter) called Salonpas lidocaine patches.  Apply in the morning and take off in the evening.  Do not leave on skin for more than 12 hours.  Return to clinic in 3 months or sooner as needed with Dr. Judeth Horn

## 2023-03-31 NOTE — Progress Notes (Signed)
@Patient  ID: Candace Howard, female    DOB: [birth year], 52 y.o.   MRN: [id number]  Chief Complaint  Patient presents with   Consult    Pt is here for consult for copd. Pt states that she is here to get her pulm care to be taken over. Pt states no new recent imaging. Pt reports pain in her right side of chest she states might be scar tissue. Pt is on Singular, Albuterol as needed and Symbicort. Pt states that inhalers do not help with the pain in chest.     Referring provider: Storm Frisk, MD  HPI:   52 y.o. woman whom we are seeing for evaluation of COPD.  Most recent PCP note x 2 reviewed.  Previously followed by pulmonologist in [place].  Pulmonary function test performed there reportedly consistent with COPD.  She is quite dyspneic.  Short of breath with inclines or stairs or when carrying things.  At time of day when things are better or worse.  No position makes his better or worse.  No seasonal environmental factors she can identify to make things better or worse.  Albuterol seems to help some when she is practically dyspneic.  She is on Symbicort 2 puff twice daily.  She is unsure how much is helped.  Still with baseline dyspnea unchanged it seems.  She also complains of right-sided chest pain.  Present over a year since diagnosis of pneumonia.  At that time she was given prednisone and antibiotics.  Prednisone caused her sugars to go up.  Sounds like does not get prednisone frequently otherwise.  Not worse with deep breath.  Not worse with exertion.  Can be at rest.  Usually worse when she lies down, positional in nature.  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  Questionaires / Pulmonary Flowsheets:   ACT:      No data to display          MMRC:     No data to display          Epworth:       No data to display          Tests:   FENO:  No results found for: "NITRICOXIDE"  PFT:     No data to display          WALK:      No data to display          Imaging: Personally reviewed and as per EMR and discussion in this note MM 3D SCREENING MAMMOGRAM BILATERAL BREAST W/IMPLANT  Result Date: 03/21/2023 CLINICAL DATA:  Screening. EXAM: DIGITAL SCREENING BILATERAL MAMMOGRAM CAD AND TOMOSYNTHESIS TECHNIQUE: Bilateral screening digital craniocaudal and mediolateral oblique mammograms were obtained. Bilateral screening digital breast tomosynthesis was performed. The images were evaluated with computer-aided detection. Standard views were performed. COMPARISON:  Previous exam(s). ACR Breast Density Category b: There are scattered areas of fibroglandular density. FINDINGS: There are no findings suspicious for malignancy. IMPRESSION: No mammographic evidence of malignancy. A result letter of this screening mammogram will be mailed directly to the patient. RECOMMENDATION: Screening mammogram in one year. (Code:SM-B-01Y) BI-RADS CATEGORY  1: Negative. Electronically Signed   By: Sherian Rein M.D.   On: 03/21/2023 09:24    Lab Results: Personally reviewed CBC    Component Value Date/Time   WBC 9.2  04/27/2022 0949   WBC 8.4 12/25/2021 1030   RBC 5.22 04/27/2022 0949   RBC 4.19 12/25/2021 1030   HGB 16.6 (H) 04/27/2022 0949   HCT 48.7 (H) 04/27/2022 0949   PLT 328 04/27/2022 0949   MCV 93 04/27/2022 0949   MCH 31.8 04/27/2022 0949   MCH 31.5 12/25/2021 1030   MCHC 34.1 04/27/2022 0949   MCHC 32.3 12/25/2021 1030   RDW 12.2 04/27/2022 0949   LYMPHSABS 2.6 04/27/2022 0949   EOSABS 0.0 04/27/2022 0949   BASOSABS 0.0 04/27/2022 0949    BMET    Component Value Date/Time   NA 140 02/02/2023 1001   K 4.3 02/02/2023 1001   CL 101 02/02/2023 1001   CO2 17 (L) 02/02/2023 1001   GLUCOSE 200 (H) 02/02/2023 1001   GLUCOSE 195 (H) 04/01/2022 0914   BUN 10 02/02/2023 1001    CREATININE 0.55 (L) 02/02/2023 1001   CREATININE 0.77 04/01/2022 0914   CALCIUM 8.8 02/02/2023 1001   GFRNONAA >60 12/27/2021 0640    BNP No results found for: "BNP"  ProBNP No results found for: "PROBNP"  Specialty Problems       Pulmonary Problems   COPD exacerbation   Vocal cord nodules    Allergies  Allergen Reactions   Hydrocodone     Other reaction(s): Unknown/See Comments "Feel really high"    Immunization History  Administered Date(s) Administered   Influenza, Seasonal, Injecte, Preservative Fre 09/28/2013   Influenza,inj,Quad PF,6+ Mos 02/02/2023   PNEUMOCOCCAL CONJUGATE-20 04/27/2022   PPD Test 09/06/2013   Pneumococcal Polysaccharide-23 07/22/2014   Tdap 04/27/2022    Past Medical History:  Diagnosis Date   Anemia    Chest pain    Depression    Difficulty breathing    Frequent headaches    IBS (irritable bowel syndrome)    Palpitations    Poor circulation     Tobacco History: Social History   Tobacco Use  Smoking Status Every Day   Packs/day: 0.50   Years: 35.00   Additional pack years: 0.00   Total pack years: 17.50   Types: Cigarettes  Smokeless Tobacco Never  Tobacco Comments   Pt states she started back smoking 1 month ago and is smoking 1 ppd. ALS 03/31/23   Ready to quit: Not Answered Counseling given: Not Answered Tobacco comments: Pt states she started back smoking 1 month ago and is smoking 1 ppd. ALS 03/31/23   Continue to not smoke  Outpatient Encounter Medications as of 03/31/2023  Medication Sig   Accu-Chek Softclix Lancets lancets Use as instructed   albuterol (VENTOLIN HFA) 108 (90 Base) MCG/ACT inhaler Inhale 2 puffs into the lungs every 6 (six) hours as needed for wheezing or shortness of breath.   Blood Glucose Monitoring Suppl (ACCU-CHEK GUIDE) w/Device KIT use as directed once daily   budesonide-formoterol (SYMBICORT) 160-4.5 MCG/ACT inhaler Inhale 2 puffs into the lungs 2 (two) times daily.   fluticasone  (FLONASE) 50 MCG/ACT nasal spray Insert 1 spray into each nostril 2 times a day   gabapentin (NEURONTIN) 100 MG capsule Take 1 capsule (100 mg total) by mouth at bedtime.   glipiZIDE (GLUCOTROL) 5 MG tablet Take 1 tablet (5 mg total) by mouth 2 (two) times daily before a meal.   glucose blood (ACCU-CHEK GUIDE) test strip use  2 (two) times daily as instructed   ipratropium-albuterol (DUONEB) 0.5-2.5 (3) MG/3ML SOLN Take 3 mLs by nebulization every 6 (six) hours as needed.   montelukast (SINGULAIR) 10 MG  tablet Take 1 tablet (10 mg total) by mouth daily. Needs office visit.   naproxen (NAPROSYN) 500 MG tablet Take 1 tablet (500 mg total) by mouth 2 (two) times daily with a meal for 7 days.   omeprazole (PRILOSEC) 40 MG capsule Take 1 capsule (40 mg total) by mouth at bedtime.   risperiDONE (RISPERDAL) 3 MG tablet Take 1 tablet (3 mg total) by mouth daily.   Tiotropium Bromide Monohydrate (SPIRIVA RESPIMAT) 2.5 MCG/ACT AERS Inhale 2 puffs into the lungs daily.   traZODone (DESYREL) 100 MG tablet Take 1 tablet (100 mg total) by mouth at bedtime as needed for sleep.   [DISCONTINUED] ALBUTEROL IN Inhale into the lungs.   [DISCONTINUED] podofilox (CONDYLOX) 0.5 % external solution Apply topically every 12 hours in the morning and evening for 3 days, then withhold for 4 days; repeat cycle up to 4 times   No facility-administered encounter medications on file as of 03/31/2023.     Review of Systems  Review of Systems  Chest with exertion.  No orthopnea or PND.  Comprehensive review of systems otherwise negative. Physical Exam  BP 128/74 (BP Location: Left Arm, Patient Position: Sitting, Cuff Size: Normal)   Pulse 84   Ht 5\' 4"  (1.626 m)   Wt 168 lb (76.2 kg)   SpO2 97%   BMI 28.84 kg/m   Wt Readings from Last 5 Encounters:  03/31/23 168 lb (76.2 kg)  02/02/23 170 lb (77.1 kg)  10/16/22 158 lb 3.2 oz (71.8 kg)  09/28/22 158 lb (71.7 kg)  06/08/22 159 lb (72.1 kg)    BMI Readings from  Last 5 Encounters:  03/31/23 28.84 kg/m  02/02/23 29.18 kg/m  10/16/22 27.15 kg/m  09/28/22 27.12 kg/m  06/08/22 27.29 kg/m     Physical Exam General: Sitting in chair, no acute distress Eyes: EOMI, no icterus Neck: Supple, no JVP Pulmonary: Distant, normal work of breathing Cardiovascular: Warm, no edema Abdomen: Nondistended, soft present MSK: No send mild, no joint effusion, palpation of right mid chest midclavicular line without pain Neuro: Normal gait, no weakness Psych: Normal mood, full affect   Assessment & Plan:   COPD: Based on prior pulmonary function tests at practice in [place].  On Symbicort.  Still symptomatic.  Add Spiriva for triple inhaled therapy.  New prescription today.  Albuterol refilled today.  Will track down PFTs at outside clinic for review.  Tobacco abuse: Recently resumed smoking. Smoking assessment and cessation counseling I have advised the patient to quit/stop smoking as soon as possible due to high risk for multiple medical problems.  It will also be very difficult for [place] to manage patient's  respiratory symptoms and status if we continue to expose her lungs to a known irritant.  We do not advise e-cigarettes as a form of stopping smoking. Patient is willing to quit smoking. I have advised the patient that we can assist and have options of nicotine replacement therapy, provided smoking cessation education today, provided smoking cessation counseling, and provided cessation resources. Follow-up next office visit office visit for assessment of smoking cessation.  I spent 3 minutes counseling.  Chest pain: Right-sided mid chest midclavicular line.  Not reproducible on exam.  Worse with certain positions.  Suspect MSK pain.  Present all the time.  Not exacerbated with exertion.  Bilateral breath sounds present.  No pneumothorax clinically.  Present for over a year since time of pneumonia.  Suspect ongoing inflammation despite pleuritic pain.   Referral to cardiology for evaluation  of potential coronary artery disease.  1 week naproxen 500 mg twice daily.  Can try Salonpas lidocaine patch over-the-counter as well.  Return in about 3 months (around 06/30/2023) for f/u Dr. Judeth HornHunsucker.   Karren BurlyMatthew R Amiayah Giebel, MD 03/31/2023   This appointment required 65 minutes of patient care (this includes precharting, chart review, review of results, face-to-face care, etc.).

## 2023-04-01 ENCOUNTER — Other Ambulatory Visit: Payer: Self-pay

## 2023-04-14 ENCOUNTER — Other Ambulatory Visit: Payer: Self-pay

## 2023-04-14 ENCOUNTER — Other Ambulatory Visit (HOSPITAL_COMMUNITY): Payer: Self-pay

## 2023-04-14 MED ORDER — DOXYCYCLINE HYCLATE 100 MG PO CAPS
100.0000 mg | ORAL_CAPSULE | Freq: Two times a day (BID) | ORAL | 0 refills | Status: DC
  Filled 2023-04-14 – 2023-04-21 (×2): qty 20, 10d supply, fill #0

## 2023-04-14 MED ORDER — FLUCONAZOLE 200 MG PO TABS
200.0000 mg | ORAL_TABLET | Freq: Every day | ORAL | 1 refills | Status: DC
  Filled 2023-04-14 – 2023-04-21 (×3): qty 1, 1d supply, fill #0

## 2023-04-18 ENCOUNTER — Other Ambulatory Visit (HOSPITAL_COMMUNITY): Payer: Self-pay

## 2023-04-18 MED ORDER — IBUPROFEN 800 MG PO TABS
800.0000 mg | ORAL_TABLET | Freq: Four times a day (QID) | ORAL | 1 refills | Status: DC
  Filled 2023-04-18: qty 30, 8d supply, fill #0

## 2023-04-21 ENCOUNTER — Other Ambulatory Visit (HOSPITAL_COMMUNITY): Payer: Self-pay

## 2023-04-21 ENCOUNTER — Other Ambulatory Visit: Payer: Self-pay

## 2023-04-28 ENCOUNTER — Ambulatory Visit: Payer: Self-pay

## 2023-04-28 DIAGNOSIS — F17210 Nicotine dependence, cigarettes, uncomplicated: Secondary | ICD-10-CM | POA: Insufficient documentation

## 2023-04-28 NOTE — Telephone Encounter (Signed)
  Chief Complaint: Pt would like a different diabetes medication prescribed Symptoms: Nightmares Frequency: Since starting medication Pertinent Negatives: Patient denies  Disposition: [] ED /[] Urgent Care (no appt availability in office) / [] Appointment(In office/virtual)/ []  [facility]/ [] Home Care/ [] Refused Recommended Disposition /[] [facility]/ [x]  Follow-up with PCP Additional Notes: Pt states that glipizide is causing terrible nightmares. Pt would like a different medication prescribed.  Pt has resumed smoking and has a near constant wet cough. Pt has upcoming appt and does not want to come in sooner to the office for this, nor does she want help with smoking cessation. Pt states that she used hypnosis in the past and will try that again.  Summary: pt having horrific nitemares with med   glipiZIDE (GLUCOTROL) 5 MG tablet Pls FU with pt at [phone]. She states that this med causes her to have terrible nitemares and she must have Dr Delford Field give her another med. She knows that she needs to take something but this is not it. Pls fu  to advise as the side effect is not tolerable.     Reason for Disposition  Prescription request for new medicine (not a refill)  Answer Assessment - Initial Assessment Questions 1. NAME of MEDICINE: "What medicine(s) are you calling about?"     Glipizide 2. QUESTION: "What is your question?" (e.g., double dose of medicine, side effect)     Needs a different medication. 3. PRESCRIBER: "Who prescribed the medicine?" Reason: if prescribed by specialist, call should be referred to that group.     Dr. Delford Field 4. SYMPTOMS: "Do you have any symptoms?" If Yes, ask: "What symptoms are you having?"  "How bad are the symptoms (e.g., mild, moderate, severe)     Nightmares  Protocols used: Medication Question Call-A-AH

## 2023-04-28 NOTE — Telephone Encounter (Signed)
Pt has been informed.

## 2023-04-28 NOTE — Telephone Encounter (Signed)
Tell patient to hold glipizide until next appt, should go to urgent care for cough or come sooner to our clinic :: can reschedule sooner

## 2023-05-05 ENCOUNTER — Other Ambulatory Visit (HOSPITAL_COMMUNITY): Payer: Self-pay

## 2023-05-05 ENCOUNTER — Other Ambulatory Visit: Payer: Self-pay

## 2023-05-05 MED ORDER — AMOXICILLIN-POT CLAVULANATE 500-125 MG PO TABS
1.0000 | ORAL_TABLET | Freq: Two times a day (BID) | ORAL | 0 refills | Status: DC
  Filled 2023-05-05: qty 28, 14d supply, fill #0

## 2023-05-06 ENCOUNTER — Other Ambulatory Visit: Payer: Self-pay

## 2023-05-07 ENCOUNTER — Ambulatory Visit: Payer: Medicaid Other | Admitting: Family

## 2023-05-09 ENCOUNTER — Other Ambulatory Visit (HOSPITAL_COMMUNITY): Payer: Self-pay

## 2023-05-09 ENCOUNTER — Other Ambulatory Visit: Payer: Self-pay

## 2023-05-09 MED ORDER — AMOXICILLIN-POT CLAVULANATE 500-125 MG PO TABS
1.0000 | ORAL_TABLET | Freq: Two times a day (BID) | ORAL | 0 refills | Status: DC
  Filled 2023-05-09: qty 28, 14d supply, fill #0

## 2023-05-14 ENCOUNTER — Ambulatory Visit: Payer: Medicaid Other | Admitting: Family

## 2023-05-18 ENCOUNTER — Encounter (HOSPITAL_COMMUNITY): Payer: Self-pay | Admitting: Psychiatry

## 2023-05-18 ENCOUNTER — Other Ambulatory Visit: Payer: Self-pay

## 2023-05-18 ENCOUNTER — Telehealth (INDEPENDENT_AMBULATORY_CARE_PROVIDER_SITE_OTHER): Payer: Medicaid Other | Admitting: Psychiatry

## 2023-05-18 ENCOUNTER — Other Ambulatory Visit (HOSPITAL_COMMUNITY): Payer: Self-pay

## 2023-05-18 DIAGNOSIS — F411 Generalized anxiety disorder: Secondary | ICD-10-CM | POA: Diagnosis not present

## 2023-05-18 DIAGNOSIS — F3160 Bipolar disorder, current episode mixed, unspecified: Secondary | ICD-10-CM | POA: Diagnosis not present

## 2023-05-18 MED ORDER — TRAZODONE HCL 150 MG PO TABS
150.0000 mg | ORAL_TABLET | Freq: Every evening | ORAL | 1 refills | Status: DC | PRN
  Filled 2023-05-18: qty 90, 90d supply, fill #0

## 2023-05-18 MED ORDER — GABAPENTIN 100 MG PO CAPS
100.0000 mg | ORAL_CAPSULE | Freq: Every day | ORAL | 1 refills | Status: DC
  Filled 2023-05-18: qty 270, 270d supply, fill #0
  Filled 2023-08-05: qty 30, 30d supply, fill #0
  Filled 2023-08-10: qty 90, 90d supply, fill #0

## 2023-05-18 MED ORDER — RISPERIDONE 3 MG PO TABS
3.0000 mg | ORAL_TABLET | Freq: Every day | ORAL | 1 refills | Status: DC
  Filled 2023-05-18 – 2023-06-01 (×2): qty 90, 90d supply, fill #0

## 2023-05-18 NOTE — Progress Notes (Signed)
BH MD/PA/NP OP Progress Note Virtual Visit via Video Note  I connected with Candace Howard on 05/18/23 at  8:00 AM EDT by a video enabled telemedicine application and verified that I am speaking with the correct person using two identifiers.  Location: Patient: Work Provider: Clinic   I discussed the limitations of evaluation and management by telemedicine and the availability of in person appointments. The patient expressed understanding and agreed to proceed.  I provided 30 minutes of non-face-to-face time during this encounter.   05/18/2023 8:55 AM Candace Howard  MRN:  [id number]  Chief Complaint: "My medications are well"  HPI: 52 year old female seen today for follow-up psychiatric evaluation.  Patient has not been seen by writer in over a year.  She has a psychiatric history of substance use (Alcohol, Tobacco, meth, and cocaine in remission) Bipolar affective disorder, Depression, anxiety, and PTSD.  Currently she is being managed on Risperdal 3 mg nightly, gabapentin 100 mg 3 times daily (reports that she became addicted to higher doses), and trazodone 100 mg nightly as needed.  Today she reports her medications are effective in managing her psychiatric condition.  Today she was well-groomed, pleasant, cooperative, and engaged in conversation.  Patient informed Clinical research associate that her medications are working well. She notes however that she is under the weather because she has a leg infection. She notes that her legs hurt and quantifies her pain as 4/10. She however reports that Tylenol and her antibiotic are helping.  Since her last visit she notes that her anxiety and depression are well managed. Today provider conducted a GAD 7 and patient scored a 4,  at her last visit she scored an 8.  Provider also conducted PHQ-9 and patient scored a 9, at her last visit she scored a 4.  Today she denies SI/HI/VAH or paranoia.  Patient reports that her sleep is fluctuates.  He reports that she has an  adequate appetite.  Patient informed Clinical research associate that she has been working as a traffic control person.  She notes that she finds enjoyment in her job.  Today Trazodone 100 mg increased to 150 mg to help manage sleep. Patient notes that her medicaid will expire soon as she will get Ave Filter. Patient informed that [facility] does not take this insurance. Patient notes that she fears she will not be able to find another provider and notes that she is concerned that she will run out her medications. Patient given three month supply of medications.  No other concerns noted at this time.   Visit Diagnosis:    ICD-10-CM   1. Anxiety state  F41.1 gabapentin (NEURONTIN) 100 MG capsule    traZODone (DESYREL) 150 MG tablet    risperiDONE (RISPERDAL) 3 MG tablet    2. Bipolar affective disorder, current episode mixed, without psychotic features (HCC)  F31.60 gabapentin (NEURONTIN) 100 MG capsule    traZODone (DESYREL) 150 MG tablet    risperiDONE (RISPERDAL) 3 MG tablet      Past Psychiatric History: substance use (Alcohol, Tobacco, meth, and cocaine) Bipolar affective disorder, Depression, anxiety, and PTSD  Past Medical History:  Past Medical History:  Diagnosis Date   Anemia    Chest pain    Depression    Difficulty breathing    Frequent headaches    IBS (irritable bowel syndrome)    Palpitations    Poor circulation     Past Surgical History:  Procedure Laterality Date   CARPAL TUNNEL RELEASE Bilateral    SKIN GRAFT Bilateral  Family Psychiatric History:  Son Substance use, maternal 4 uncles SI, mother abuse of pill use   Family History:  Family History  Problem Relation Age of Onset   Heart disease Father    Hypertension Father    Diabetes Father    Diabetes Paternal Grandmother    COPD Mother    Glaucoma Mother     Social History:  Social History   Socioeconomic History   Marital status: Single    Spouse name: Not on file   Number of children: Not on file    Years of education: Not on file   Highest education level: Not on file  Occupational History   Not on file  Tobacco Use   Smoking status: Every Day    Packs/day: 0.50    Years: 35.00    Additional pack years: 0.00    Total pack years: 17.50    Types: Cigarettes   Smokeless tobacco: Never   Tobacco comments:    Pt states she started back smoking 1 month ago and is smoking 1 ppd. ALS 03/31/23  Vaping Use   Vaping Use: Never used  Substance and Sexual Activity   Alcohol use: Not Currently    Comment: 2 months sober.   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   Sexual activity: Not Currently  Other Topics Concern   Not on file  Social History Narrative   Not on file   Social Determinants of Health   Financial Resource Strain: High Risk (01/11/2022)   Overall Financial Resource Strain (CARDIA)    Difficulty of Paying Living Expenses: Hard  Food Insecurity: No Food Insecurity (01/11/2022)   Hunger Vital Sign    Worried About Running Out of Food in the Last Year: Never true    Ran Out of Food in the Last Year: Never true  Transportation Needs: No Transportation Needs (01/11/2022)   PRAPARE - Administrator, Civil Service (Medical): No    Lack of Transportation (Non-Medical): No  Physical Activity: Inactive (01/11/2022)   Exercise Vital Sign    Days of Exercise per Week: 0 days    Minutes of Exercise per Session: 0 min  Stress: Stress Concern Present (01/11/2022)   [employer] of Occupational Health - Occupational Stress Questionnaire    Feeling of Stress : Very much  Social Connections: Socially Isolated (01/11/2022)   Social Connection and Isolation Panel [NHANES]    Frequency of Communication with Friends and Family: More than three times a week    Frequency of Social Gatherings with Friends and Family: Three times a week    Attends Religious Services: Never    Active Member of Clubs or Organizations: No    Attends Tax inspector Meetings: Never    Marital Status: Never married    Allergies:  Allergies  Allergen Reactions   Hydrocodone     Other reaction(s): Unknown/See Comments "Feel really high"    Metabolic Disorder Labs: Lab Results  Component Value Date   HGBA1C 7.1 (A) 02/02/2023   MPG 108.28 12/25/2021   No results found for: "PROLACTIN" Lab Results  Component Value Date   CHOL 182 12/27/2021   TRIG 114 12/27/2021   HDL 49 12/27/2021   CHOLHDL 3.7 12/27/2021   VLDL 23 12/27/2021   LDLCALC 110 (H) 12/27/2021   LDLCALC 130 (H) 12/25/2021   Lab Results  Component Value Date   TSH 0.636 12/25/2021    Therapeutic Level Labs:  No results found for: "LITHIUM" No results found for: "VALPROATE" No results found for: "CBMZ"  Current Medications: Current Outpatient Medications  Medication Sig Dispense Refill   Accu-Chek Softclix Lancets lancets Use as instructed 100 each 12   albuterol (VENTOLIN HFA) 108 (90 Base) MCG/ACT inhaler Inhale 2 puffs into the lungs every 6 (six) hours as needed for wheezing or shortness of breath. 18 g 6   amoxicillin-clavulanate (AUGMENTIN) 500-125 MG tablet Take 1 tablet by mouth 2 (two) times daily. 28 tablet 0   Blood Glucose Monitoring Suppl (ACCU-CHEK GUIDE) w/Device KIT use as directed once daily 1 kit 0   budesonide-formoterol (SYMBICORT) 160-4.5 MCG/ACT inhaler Inhale 2 puffs into the lungs 2 (two) times daily. 10.2 g 3   doxycycline (VIBRAMYCIN) 100 MG capsule Take 1 capsule (100 mg total) by mouth 2 (two) times daily for 10 days. 20 capsule 0   fluconazole (DIFLUCAN) 200 MG tablet Take 1 tablet (200 mg total) by mouth daily. 1 tablet 1   fluticasone (FLONASE) 50 MCG/ACT nasal spray Insert 1 spray into each nostril 2 times a day 16 g 3   gabapentin (NEURONTIN) 100 MG capsule Take 1 capsule (100 mg total) by mouth at bedtime. 270 capsule 1   glipiZIDE (GLUCOTROL) 5 MG tablet Take 1 tablet (5 mg total) by mouth 2 (two) times daily before a meal. 60  tablet 3   glucose blood (ACCU-CHEK GUIDE) test strip use  2 (two) times daily as instructed 100 each 12   ibuprofen (ADVIL) 800 MG tablet Take 1 tablet (800 mg total) by mouth every 6 (six) hours for mild pain/inflammation. 30 tablet 1   ipratropium-albuterol (DUONEB) 0.5-2.5 (3) MG/3ML SOLN Take 3 mLs by nebulization every 6 (six) hours as needed. 120 mL 0   montelukast (SINGULAIR) 10 MG tablet Take 1 tablet (10 mg total) by mouth daily. Needs office visit. 60 tablet 2   omeprazole (PRILOSEC) 40 MG capsule Take 1 capsule (40 mg total) by mouth at bedtime. 60 capsule 2   risperiDONE (RISPERDAL) 3 MG tablet Take 1 tablet (3 mg total) by mouth daily. 90 tablet 1   Tiotropium Bromide Monohydrate (SPIRIVA RESPIMAT) 2.5 MCG/ACT AERS Inhale 2 puffs into the lungs daily. 4 g 11   traZODone (DESYREL) 150 MG tablet Take 1 tablet (150 mg total) by mouth at bedtime as needed. 90 tablet 1   No current facility-administered medications for this visit.     Musculoskeletal: Strength & Muscle Tone: within normal limits telehealth visit Gait & Station: normal telehealth visit Patient leans: N/A  Psychiatric Specialty Exam: Review of Systems  There were no vitals taken for this visit.There is no height or weight on file to calculate BMI.  General Appearance: Well Groomed  Eye Contact:  Good  Speech:  Clear and Coherent and Normal Rate  Volume:  Normal  Mood:  Euthymic  Affect:  Appropriate and Congruent  Thought Process:  Coherent, Goal Directed, and Linear  Orientation:  Full (Time, Place, and Person)  Thought Content: WDL   Suicidal Thoughts:  No  Homicidal Thoughts:  No  Memory:  Immediate;   Good Recent;   Good Remote;   Good  Judgement:  Good  Insight:  Good  Psychomotor Activity:  Normal  Concentration:  Concentration: Good and Attention Span: Good  Recall:  Good  Fund of Knowledge: Good  Language: Good  Akathisia:  No  Handed:  Left  AIMS (if indicated): not done  Assets:   Communication Skills Desire for  Improvement Financial Resources/Insurance Housing Physical Health Social Support  ADL's:  Intact  Cognition: WNL  Sleep:  Fair   Screenings: AIMS    Flowsheet Row Clinical Support from 01/11/2022 in [facility]  AIMS Total Score 0 0      AUDIT    Flowsheet Row Ad[facility] (Discharged) [facility]  Alcohol Use Disorder Identification Test Final Score (AUDIT) 0      GAD-7    Flowsheet Row Video Visit from 05/18/2023 in [facility] Office Visit from 02/28/2023 in [facility] Office Visit from 02/02/2023 in [facility] Office Visit from 04/27/2022 in [facility] Office Visit from 04/01/2022 in [facility]  Total GAD-7 Score 4 8 3 3 6       PHQ2-9    Flowsheet Row Video Visit from 05/18/2023 in [facility] Office Visit from 02/28/2023 in [facility] Office Visit from 02/02/2023 in [facility] Office Visit from 04/28/2022 in [facility] Office Visit from 04/27/2022 in [facility]  PHQ-2 Total Score 3 1 0 0 1  PHQ-9 Total Score 9 4 7  -- 3      Flowsheet Row Office Visit from 02/28/2023 in [facility] Counselor from 01/11/2022 in [facility] Admission (Discharged) [facility]  C-SSRS RISK CATEGORY Low Risk High Risk No Risk        Assessment and Plan: Patient informed writer that time her sleep fluctuates. She notes her anxiety, depression, and mood has improved. Today  Trazodone 100 mg increased to 150 mg to help manage sleep. Patient notes that her medicaid will expire soon as she will get Ave Filter. Patient informed that [facility] does not take this insurance. Patient notes that she fears she will not be able to find another provider and notes that she is concerned that she will run out her medications. Patient given three month supply of medications   1. Anxiety state  Continue- gabapentin (NEURONTIN) 100 MG capsule; Take 1 capsule (100 mg total) by mouth at bedtime.  Dispense: 270 capsule; Refill: 1 Increased- traZODone (DESYREL) 150 MG tablet; Take 1 tablet (150 mg total) by mouth at bedtime as needed.  Dispense: 90 tablet; Refill: 1 Continue- risperiDONE (RISPERDAL) 3 MG tablet; Take 1 tablet (3 mg total) by mouth daily.  Dispense: 90 tablet; Refill: 1  2. Bipolar affective disorder, current episode mixed, without psychotic features (HCC)  Continue- gabapentin (NEURONTIN) 100 MG capsule; Take 1 capsule (100 mg total) by mouth at bedtime.  Dispense: 270 capsule; Refill: 1 Increased- traZODone (DESYREL) 150 MG tablet; Take 1 tablet (150 mg total) by mouth at bedtime as needed.  Dispense: 90 tablet; Refill: 1 Continue- risperiDONE (RISPERDAL) 3 MG tablet; Take 1 tablet (3 mg total) by mouth daily.  Dispense: 90 tablet; Refill: 1  Collaboration of Care: Collaboration of Care: Other provider involved in patient's care AEB PCP  Patient/Guardian was advised Release of Information must be obtained prior to any record release in order to collaborate their care with an outside provider. Patient/Guardian was advised if they have not already done so to contact the registration  department to sign all necessary forms in order for [place] to release information regarding their care.   Consent: Patient/Guardian gives verbal consent for treatment and assignment of benefits for services provided during this visit. Patient/Guardian expressed understanding and agreed to  proceed.   Follow-up in 3 months Shanna Cisco, NP 05/18/2023, 8:55 AM

## 2023-05-19 ENCOUNTER — Other Ambulatory Visit: Payer: Self-pay | Admitting: Physician Assistant

## 2023-05-19 DIAGNOSIS — E119 Type 2 diabetes mellitus without complications: Secondary | ICD-10-CM

## 2023-05-20 ENCOUNTER — Other Ambulatory Visit: Payer: Self-pay

## 2023-05-20 MED ORDER — ACCU-CHEK GUIDE VI STRP
1.0000 | ORAL_STRIP | Freq: Two times a day (BID) | 12 refills | Status: AC
  Filled 2023-05-20 (×2): qty 100, 50d supply, fill #0

## 2023-05-20 MED ORDER — ACCU-CHEK GUIDE W/DEVICE KIT
1.0000 [IU] | PACK | Freq: Every day | 0 refills | Status: AC
  Filled 2023-05-20 (×2): qty 1, 30d supply, fill #0

## 2023-05-21 ENCOUNTER — Other Ambulatory Visit (HOSPITAL_COMMUNITY): Payer: Self-pay

## 2023-05-24 ENCOUNTER — Other Ambulatory Visit (HOSPITAL_COMMUNITY): Payer: Self-pay

## 2023-05-24 ENCOUNTER — Telehealth: Payer: Self-pay | Admitting: Critical Care Medicine

## 2023-05-24 NOTE — Telephone Encounter (Signed)
Pt returned phone call and stated that Medicaid ended due to employment; sched appt for financial on Thurs 6/6 at 10AM

## 2023-05-24 NOTE — Telephone Encounter (Signed)
Left Message - Called to get more info a/b pt's Medicaid and discuss financial assistance options (if necessary) - left name and CB#

## 2023-05-25 ENCOUNTER — Other Ambulatory Visit: Payer: Self-pay

## 2023-05-26 ENCOUNTER — Ambulatory Visit: Payer: Medicaid Other

## 2023-05-26 ENCOUNTER — Ambulatory Visit: Payer: Medicaid Other | Admitting: Critical Care Medicine

## 2023-06-02 ENCOUNTER — Other Ambulatory Visit (HOSPITAL_COMMUNITY): Payer: Self-pay

## 2023-06-02 ENCOUNTER — Ambulatory Visit: Payer: Medicaid Other | Admitting: Cardiology

## 2023-06-06 ENCOUNTER — Ambulatory Visit: Payer: Self-pay | Admitting: *Deleted

## 2023-06-06 NOTE — Telephone Encounter (Signed)
  Chief Complaint: upper abdominal pain and tender to touch.  Symptoms: see above. Started after eating cottage cheese on Saturday. Just completed antibiotics 6 days ago . C/o gas at times. Bloated feeling at times. Constant pain but intensity of pain worse at different times. Can eat breakfast and no pain and then eat lunch and causes worsening pain. Took prilosec and no relief. Can not lay back. Radiates to chest . Some loose BMs reported.  Frequency: Saturday 06/04/23 Pertinent Negatives: Patient denies chest pain no difficulty breathing no fever, no N/V no diarrhea  Disposition: [] ED /[] Urgent Care (no appt availability in office) / [] Appointment(In office/virtual)/ []  [place] [facility]/ [] Home Care/ [x] Refused Recommended Disposition /[] [place] [facility]/ []  Follow-up with PCP Additional Notes:   Recommended UC. Patient declined has no insurance. Recommended ED due to no available appt until Jul. 10 please advise.      Reason for Disposition  [1] MILD-MODERATE pain AND [2] constant AND [3] present > 2 hours  Answer Assessment - Initial Assessment Questions 1. LOCATION: "Where does it hurt?"      Upper  2. RADIATION: "Does the pain shoot anywhere else?" (e.g., chest, back)     Chest  3. ONSET: "When did the pain begin?" (e.g., minutes, hours or days ago)      Saturday morning after eating cottage cheese 4. SUDDEN: "Gradual or sudden onset?"     After eating cottage cheese 5. PATTERN "Does the pain come and go, or is it constant?"    - If it comes and goes: "How long does it last?" "Do you have pain now?"     (Note: Comes and goes means the pain is intermittent. It goes away completely between bouts.)    - If constant: "Is it getting better, staying the same, or getting worse?"      (Note: Constant means the pain never goes away completely; most serious pain is constant and gets worse.)      Constant  6. SEVERITY: "How bad is the pain?"  (e.g., Scale 1-10; mild, moderate,  or severe)    - MILD (1-3): Doesn't interfere with normal activities, abdomen soft and not tender to touch..     - MODERATE (4-7): Interferes with normal activities or awakens from sleep, abdomen tender to touch.     - SEVERE (8-10): Excruciating pain, doubled over, unable to do any normal activities.       Tender to touch . Passing gas and feels better  7. RECURRENT SYMPTOM: "Have you ever had this type of stomach pain before?" If Yes, ask: "When was the last time?" and "What happened that time?"      No  8. AGGRAVATING FACTORS: "Does anything seem to cause this pain?" (e.g., foods, stress, alcohol)     Some times can eat breakfast and no issues and then tries to eat lunch and pain worsens 9. CARDIAC SYMPTOMS: "Do you have any of the following symptoms: chest pain, difficulty breathing, sweating, nausea?"     No  10. OTHER SYMPTOMS: "Do you have any other symptoms?" (e.g., back pain, diarrhea, fever, urination pain, vomiting)       Upper abdominal pain tender to touch. Headache x couple of days taking ibuprofen helps headache but stomach continues to be in pain 11. PREGNANCY: "Is there any chance you are pregnant?" "When was your last menstrual period?"       na  Protocols used: Abdominal Pain - Upper-A-AH

## 2023-06-08 NOTE — Telephone Encounter (Signed)
Noted  

## 2023-06-08 NOTE — Telephone Encounter (Signed)
Called patient to follow-up with s/s . Patient does not have any ED encounter in her chart. Unable to reach or leave message.

## 2023-07-28 ENCOUNTER — Encounter: Payer: Self-pay | Admitting: Cardiology

## 2023-07-28 ENCOUNTER — Ambulatory Visit: Payer: Medicaid Other | Attending: Cardiology | Admitting: Cardiology

## 2023-08-05 ENCOUNTER — Other Ambulatory Visit: Payer: Self-pay

## 2023-08-05 ENCOUNTER — Other Ambulatory Visit (HOSPITAL_COMMUNITY): Payer: Self-pay

## 2023-08-05 ENCOUNTER — Ambulatory Visit: Payer: Self-pay

## 2023-08-05 NOTE — Telephone Encounter (Signed)
Chief Complaint: Chest pain  Symptoms: cough, SOB, moderate chest pain Frequency: comes and goes  Pertinent Negatives: Patient denies fever, nausea, vomiting Disposition: [] ED /[] Urgent Care (no appt availability in office) / [x] Appointment(In office/virtual)/ []  [place] [facility]/ [] Home Care/ [] Refused Recommended Disposition /[] [place] [facility]/ []  Follow-up with PCP Additional Notes: Patient states that she has moderate chest pain and a Hx of pneumonia, COPD, and asthma. Patient is a smoker and states she has a cough and one of her roommates are sick as well. Patient given care advice and scheduled for an appointment 08/09/23. Advised patient if symptoms get worse to go to UC. Patient verbalized understanding. Reason for Disposition  [1] Chest pain lasts < 5 minutes AND [2] NO chest pain or cardiac symptoms (e.g., breathing difficulty, sweating) now  (Exception: Chest pains that last only a few seconds.)  Answer Assessment - Initial Assessment Questions 1. LOCATION: "Where does it hurt?"       Left side  2. RADIATION: "Does the pain go anywhere else?" (e.g., into neck, jaw, arms, back)     No 3. ONSET: "When did the chest pain begin?" (Minutes, hours or days)      Yesterday  4. PATTERN: "Does the pain come and go, or has it been constant since it started?"  "Does it get worse with exertion?"      Comes and goes  5. DURATION: "How long does it last" (e.g., seconds, minutes, hours)     Few minutes at a time  6. SEVERITY: "How bad is the pain?"  (e.g., Scale 1-10; mild, moderate, or severe)    - MILD (1-3): doesn't interfere with normal activities     - MODERATE (4-7): interferes with normal activities or awakens from sleep    - SEVERE (8-10): excruciating pain, unable to do any normal activities       4/10 7. CARDIAC RISK FACTORS: "Do you have any history of heart problems or risk factors for heart disease?" (e.g., angina, prior heart attack; diabetes, high blood pressure, high  cholesterol, smoker, or strong family history of heart disease)     No 8. PULMONARY RISK FACTORS: "Do you have any history of lung disease?"  (e.g., blood clots in lung, asthma, emphysema, birth control pills)     Asthma, COPD, smoker  9. CAUSE: "What do you think is causing the chest pain?"     I think I am sick  10. OTHER SYMPTOMS: "Do you have any other symptoms?" (e.g., dizziness, nausea, vomiting, sweating, fever, difficulty breathing, cough)       Cough,  Protocols used: Chest Pain-A-AH

## 2023-08-08 ENCOUNTER — Encounter: Payer: Self-pay | Admitting: Physician Assistant

## 2023-08-08 ENCOUNTER — Other Ambulatory Visit (HOSPITAL_COMMUNITY): Payer: Self-pay

## 2023-08-08 ENCOUNTER — Ambulatory Visit: Payer: Self-pay | Attending: Physician Assistant | Admitting: Physician Assistant

## 2023-08-08 VITALS — BP 121/81 | HR 86 | Wt 165.4 lb

## 2023-08-08 DIAGNOSIS — Z7984 Long term (current) use of oral hypoglycemic drugs: Secondary | ICD-10-CM | POA: Insufficient documentation

## 2023-08-08 DIAGNOSIS — J40 Bronchitis, not specified as acute or chronic: Secondary | ICD-10-CM | POA: Insufficient documentation

## 2023-08-08 DIAGNOSIS — Z7951 Long term (current) use of inhaled steroids: Secondary | ICD-10-CM | POA: Insufficient documentation

## 2023-08-08 DIAGNOSIS — F17210 Nicotine dependence, cigarettes, uncomplicated: Secondary | ICD-10-CM | POA: Insufficient documentation

## 2023-08-08 DIAGNOSIS — Z91199 Patient's noncompliance with other medical treatment and regimen due to unspecified reason: Secondary | ICD-10-CM | POA: Insufficient documentation

## 2023-08-08 DIAGNOSIS — E1165 Type 2 diabetes mellitus with hyperglycemia: Secondary | ICD-10-CM | POA: Insufficient documentation

## 2023-08-08 DIAGNOSIS — Z79899 Other long term (current) drug therapy: Secondary | ICD-10-CM | POA: Insufficient documentation

## 2023-08-08 DIAGNOSIS — Z716 Tobacco abuse counseling: Secondary | ICD-10-CM | POA: Insufficient documentation

## 2023-08-08 DIAGNOSIS — F17200 Nicotine dependence, unspecified, uncomplicated: Secondary | ICD-10-CM

## 2023-08-08 DIAGNOSIS — J441 Chronic obstructive pulmonary disease with (acute) exacerbation: Secondary | ICD-10-CM | POA: Insufficient documentation

## 2023-08-08 LAB — POCT GLYCOSYLATED HEMOGLOBIN (HGB A1C): HbA1c, POC (controlled diabetic range): 10.5 % — AB (ref 0.0–7.0)

## 2023-08-08 LAB — GLUCOSE, POCT (MANUAL RESULT ENTRY): POC Glucose: 359 mg/dl — AB (ref 70–99)

## 2023-08-08 MED ORDER — TRUE METRIX METER W/DEVICE KIT
1.0000 | PACK | Freq: Two times a day (BID) | 0 refills | Status: AC
  Filled 2023-08-08: qty 1, fill #0
  Filled 2023-08-10: qty 1, 30d supply, fill #0

## 2023-08-08 MED ORDER — FLUCONAZOLE 200 MG PO TABS
200.0000 mg | ORAL_TABLET | Freq: Every day | ORAL | 1 refills | Status: DC
  Filled 2023-08-08: qty 1, 1d supply, fill #0

## 2023-08-08 MED ORDER — FLUTICASONE PROPIONATE 50 MCG/ACT NA SUSP
1.0000 | Freq: Two times a day (BID) | NASAL | 3 refills | Status: DC
  Filled 2023-08-08: qty 16, 30d supply, fill #0

## 2023-08-08 MED ORDER — METFORMIN HCL 500 MG PO TABS
ORAL_TABLET | ORAL | 1 refills | Status: DC
  Filled 2023-08-08 – 2023-08-10 (×2): qty 120, 30d supply, fill #0

## 2023-08-08 MED ORDER — ALBUTEROL SULFATE HFA 108 (90 BASE) MCG/ACT IN AERS
2.0000 | INHALATION_SPRAY | Freq: Four times a day (QID) | RESPIRATORY_TRACT | 6 refills | Status: DC | PRN
  Filled 2023-08-08: qty 18, 25d supply, fill #0

## 2023-08-08 MED ORDER — AZITHROMYCIN 250 MG PO TABS
ORAL_TABLET | ORAL | 0 refills | Status: AC
  Filled 2023-08-08 – 2023-08-10 (×2): qty 6, 5d supply, fill #0

## 2023-08-08 MED ORDER — TRUEPLUS LANCETS 28G MISC
1.0000 | Freq: Two times a day (BID) | 3 refills | Status: AC
  Filled 2023-08-08: qty 100, fill #0
  Filled 2023-08-10: qty 100, 50d supply, fill #0

## 2023-08-08 MED ORDER — BUDESONIDE-FORMOTEROL FUMARATE 160-4.5 MCG/ACT IN AERO
2.0000 | INHALATION_SPRAY | Freq: Two times a day (BID) | RESPIRATORY_TRACT | 3 refills | Status: DC
  Filled 2023-08-08: qty 10.2, 30d supply, fill #0

## 2023-08-08 MED ORDER — MONTELUKAST SODIUM 10 MG PO TABS
10.0000 mg | ORAL_TABLET | Freq: Every day | ORAL | 2 refills | Status: DC
  Filled 2023-08-08: qty 60, 60d supply, fill #0

## 2023-08-08 MED ORDER — TRUE METRIX BLOOD GLUCOSE TEST VI STRP
ORAL_STRIP | 12 refills | Status: AC
  Filled 2023-08-08: qty 100, fill #0
  Filled 2023-08-10: qty 100, 30d supply, fill #0

## 2023-08-08 NOTE — Patient Instructions (Addendum)
Work at a goal of eliminating sugary drinks, candy, desserts, sweets, refined sugars, processed foods, and white carbohydrates.   Check blood sugars fasting and bedtime and drink 80 ounces water daily

## 2023-08-08 NOTE — Progress Notes (Signed)
Patient ID: Candace Howard, female   DOB: 1971/12/06, 52 y.o.   MRN: [id number]   Candace Howard, is a 52 y.o. female  QMV:[id number]  [id number]  DOB - 1971-01-01  Chief Complaint  Patient presents with   Cough   Back Pain   Sore Throat       Subjective:   Candace Howard is a 52 y.o. female here today for med RF.  She has not been taking glipizide or anything for blood sugars.  Sober and clean for a while now.  Still smoking.  Cough for about 2 weeks-some green phlegm.  Does not drink water.  No vision changes.  She has lost insurance and does not have working glucometer.    No problems updated.  ALLERGIES: Allergies  Allergen Reactions   Hydrocodone     Other reaction(s): Unknown/See Comments "Feel really high"    PAST MEDICAL HISTORY: Past Medical History:  Diagnosis Date   Anemia    Chest pain    Depression    Difficulty breathing    Frequent headaches    IBS (irritable bowel syndrome)    Palpitations    Poor circulation     MEDICATIONS AT HOME: Prior to Admission medications   Medication Sig Start Date End Date Taking? Authorizing Provider  azithromycin (ZITHROMAX) 250 MG tablet Take 2 tablets on day 1, then 1 tablet daily on days 2 through 5 08/08/23 08/13/23 Yes Oluwadamilare Tobler M, PA-C  Blood Glucose Monitoring Suppl (TRUE METRIX METER) w/Device KIT 1 each by Does not apply route 2 (two) times daily. 08/08/23  Yes Anders Simmonds, PA-C  gabapentin (NEURONTIN) 100 MG capsule Take 1 capsule (100 mg total) by mouth at bedtime. 05/18/23  Yes Toy Cookey E, NP  glucose blood (TRUE METRIX BLOOD GLUCOSE TEST) test strip Use as instructed 08/08/23  Yes Edvin Albus M, PA-C  metFORMIN (GLUCOPHAGE) 500 MG tablet Take 1 tablet (500 mg total) by mouth 2 (two) times daily with a meal. X 1week then 2 twice daily 08/08/23  Yes Vince Ainsley M, PA-C  risperiDONE (RISPERDAL) 3 MG tablet Take 1 tablet (3 mg total) by mouth daily. 05/18/23  Yes Toy Cookey E, NP   traZODone (DESYREL) 150 MG tablet Take 1 tablet (150 mg total) by mouth at bedtime as needed. 05/18/23  Yes Toy Cookey E, NP  TRUEplus Lancets 28G MISC 1 each by Does not apply route 2 (two) times daily. 08/08/23  Yes Georgian Co M, PA-C  Accu-Chek Softclix Lancets lancets Use as instructed Patient not taking: Reported on 08/08/2023 04/01/22   Mayers, Cari S, PA-C  albuterol (VENTOLIN HFA) 108 (90 Base) MCG/ACT inhaler Inhale 2 puffs into the lungs every 6 (six) hours as needed for wheezing or shortness of breath. 08/08/23   Anders Simmonds, PA-C  Blood Glucose Monitoring Suppl (ACCU-CHEK GUIDE) w/Device KIT use as directed once daily Patient not taking: Reported on 08/08/2023 05/20/23   Georganna Skeans, MD  budesonide-formoterol [facility]) 160-4.5 MCG/ACT inhaler Inhale 2 puffs into the lungs 2 (two) times daily. 08/08/23   Anders Simmonds, PA-C  doxycycline (VIBRAMYCIN) 100 MG capsule Take 1 capsule (100 mg total) by mouth 2 (two) times daily for 10 days. Patient not taking: Reported on 08/08/2023 04/14/23     fluconazole (DIFLUCAN) 200 MG tablet Take 1 tablet (200 mg total) by mouth daily. 08/08/23   Anders Simmonds, PA-C  fluticasone (FLONASE) 50 MCG/ACT nasal spray Insert 1 spray into each nostril 2 times a day 08/08/23  Georgian Co M, PA-C  glucose blood (ACCU-CHEK GUIDE) test strip use  2 (two) times daily as instructed Patient not taking: Reported on 08/08/2023 05/20/23   Georganna Skeans, MD  ibuprofen (ADVIL) 800 MG tablet Take 1 tablet (800 mg total) by mouth every 6 (six) hours for mild pain/inflammation. Patient not taking: Reported on 08/08/2023 04/18/23     ipratropium-albuterol (DUONEB) 0.5-2.5 (3) MG/3ML SOLN Take 3 mLs by nebulization every 6 (six) hours as needed. Patient not taking: Reported on 08/08/2023 04/27/22   Storm Frisk, MD  montelukast (SINGULAIR) 10 MG tablet Take 1 tablet (10 mg total) by mouth daily. Needs office visit. 08/08/23   Anders Simmonds, PA-C   omeprazole (PRILOSEC) 40 MG capsule Take 1 capsule (40 mg total) by mouth at bedtime. Patient not taking: Reported on 08/08/2023 02/02/23   Storm Frisk, MD  Tiotropium Bromide Monohydrate (SPIRIVA RESPIMAT) 2.5 MCG/ACT AERS Inhale 2 puffs into the lungs daily. Patient not taking: Reported on 08/08/2023 03/31/23   Hunsucker, Lesia Sago, MD    ROS: Neg HEENT Neg resp Neg cardiac Neg GI Neg GU Neg MS Neg psych Neg neuro  Objective:   Vitals:   08/08/23 1554  BP: 121/81  Pulse: 86  SpO2: 95%  Weight: 165 lb 6.4 oz (75 kg)   Exam General appearance : Awake, alert, not in any distress. Speech Clear. Not toxic looking HEENT: Atraumatic and Normocephalic, pupils equally reactive to light and accomodation Neck: Supple, no JVD. No cervical lymphadenopathy.  Chest: fair air entry bilaterally,No rales/rhonchi.  Some mild wheezing CVS: S1 S2 regular, no murmurs.  Extremities: B/L Lower Ext shows no edema, both legs are warm to touch Neurology: Awake alert, and oriented X 3, CN II-XII intact, Non focal Skin: No Rash  Data Review Lab Results  Component Value Date   HGBA1C 10.5 (A) 08/08/2023   HGBA1C 7.1 (A) 02/02/2023   HGBA1C 5.4 12/25/2021    Assessment & Plan   1. Type 2 diabetes mellitus with hyperglycemia, unspecified whether long term insulin use (HCC) Uncontrolled/worse/noncompliance-will restart metformin.  GFR>60 I have had a lengthy discussion and provided education about insulin resistance and the intake of too much sugar/refined carbohydrates.  I have advised the patient to work at a goal of eliminating sugary drinks, candy, desserts, sweets, refined sugars, processed foods, and white carbohydrates.  The patient expresses understanding.  - Glucose (CBG) - HgB A1c - metFORMIN (GLUCOPHAGE) 500 MG tablet; Take 1 tablet (500 mg total) by mouth 2 (two) times daily with a meal. X 1week then 2 twice daily  Dispense: 360 tablet; Refill: 1 - Blood Glucose Monitoring Suppl  (TRUE METRIX METER) w/Device KIT; 1 each by Does not apply route 2 (two) times daily.  Dispense: 1 kit; Refill: 0 - glucose blood (TRUE METRIX BLOOD GLUCOSE TEST) test strip; Use as instructed  Dispense: 100 each; Refill: 12 - TRUEplus Lancets 28G MISC; 1 each by Does not apply route 2 (two) times daily.  Dispense: 100 each; Refill: 3  2. Bronchitis Cover for atypicals - azithromycin (ZITHROMAX) 250 MG tablet; Take 2 tablets on day 1, then 1 tablet daily on days 2 through 5  Dispense: 6 tablet; Refill: 0 - fluconazole (DIFLUCAN) 200 MG tablet; Take 1 tablet (200 mg total) by mouth daily.  Dispense: 1 tablet; Refill: 1  3. Smoking Cessation advised  4. COPD exacerbation (HCC) - albuterol (VENTOLIN HFA) 108 (90 Base) MCG/ACT inhaler; Inhale 2 puffs into the lungs every 6 (six) hours as  needed for wheezing or shortness of breath.  Dispense: 18 g; Refill: 6 - montelukast (SINGULAIR) 10 MG tablet; Take 1 tablet (10 mg total) by mouth daily. Needs office visit.  Dispense: 60 tablet; Refill: 2 - budesonide-formoterol (SYMBICORT) 160-4.5 MCG/ACT inhaler; Inhale 2 puffs into the lungs 2 (two) times daily.  Dispense: 10.2 g; Refill: 3 - fluticasone (FLONASE) 50 MCG/ACT nasal spray; Insert 1 spray into each nostril 2 times a day  Dispense: 16 g; Refill: 3  5. Noncompliance Compliance imperative     Return in about 4 weeks (around 09/05/2023) for [facility] for blood sugar in 1 month then PCP in 3  months .  The patient was given clear instructions to go to ER or return to medical center if symptoms don't improve, worsen or new problems develop. The patient verbalized understanding. The patient was told to call to get lab results if they haven't heard anything in the next week.      Georgian Co, PA-C [facility] [place], [place] [phone]   08/08/2023, 5:24 PM

## 2023-08-09 ENCOUNTER — Other Ambulatory Visit (HOSPITAL_COMMUNITY): Payer: Self-pay

## 2023-08-09 ENCOUNTER — Other Ambulatory Visit: Payer: Self-pay

## 2023-08-10 ENCOUNTER — Other Ambulatory Visit (HOSPITAL_COMMUNITY): Payer: Self-pay

## 2023-08-10 ENCOUNTER — Telehealth (INDEPENDENT_AMBULATORY_CARE_PROVIDER_SITE_OTHER): Payer: Medicaid Other | Admitting: Psychiatry

## 2023-08-10 ENCOUNTER — Other Ambulatory Visit: Payer: Self-pay

## 2023-08-10 ENCOUNTER — Encounter (HOSPITAL_COMMUNITY): Payer: Self-pay | Admitting: Psychiatry

## 2023-08-10 DIAGNOSIS — F411 Generalized anxiety disorder: Secondary | ICD-10-CM

## 2023-08-10 DIAGNOSIS — F3160 Bipolar disorder, current episode mixed, unspecified: Secondary | ICD-10-CM | POA: Diagnosis not present

## 2023-08-10 MED ORDER — GABAPENTIN 100 MG PO CAPS
100.0000 mg | ORAL_CAPSULE | Freq: Every day | ORAL | 3 refills | Status: DC
  Filled 2023-08-10 (×2): qty 30, 30d supply, fill #0
  Filled 2023-08-10 (×2): qty 90, 90d supply, fill #0
  Filled 2023-09-21 – 2023-11-02 (×2): qty 30, 30d supply, fill #1

## 2023-08-10 MED ORDER — RISPERIDONE 3 MG PO TABS
3.0000 mg | ORAL_TABLET | Freq: Every day | ORAL | 1 refills | Status: DC
  Filled 2023-08-10 – 2023-09-07 (×2): qty 90, 90d supply, fill #0

## 2023-08-10 MED ORDER — TRAZODONE HCL 150 MG PO TABS
150.0000 mg | ORAL_TABLET | Freq: Every evening | ORAL | 1 refills | Status: DC | PRN
  Filled 2023-08-10: qty 90, 90d supply, fill #0
  Filled 2023-09-21: qty 30, 30d supply, fill #0
  Filled 2023-11-02: qty 30, 30d supply, fill #1

## 2023-08-10 MED ORDER — GABAPENTIN 100 MG PO CAPS
100.0000 mg | ORAL_CAPSULE | Freq: Every day | ORAL | 1 refills | Status: DC
  Filled 2023-08-10: qty 270, 270d supply, fill #0

## 2023-08-10 NOTE — Progress Notes (Signed)
BH MD/PA/NP OP Progress Note Virtual Visit via Video Note  I connected with Candace Howard on 08/10/23 at  9:00 AM EDT by a video enabled telemedicine application and verified that I am speaking with the correct person using two identifiers.  Location: Patient: Work Provider: Clinic   I discussed the limitations of evaluation and management by telemedicine and the availability of in person appointments. The patient expressed understanding and agreed to proceed.  I provided 30 minutes of non-face-to-face time during this encounter.   08/10/2023 9:30 AM Candace Howard  MRN:  [id number]  Chief Complaint: "Im not eating good"  HPI: 52 year old female seen today for follow-up psychiatric evaluation.  Patient has not been seen by writer in over a year.  She has a psychiatric history of substance use (Alcohol, Tobacco, meth, and cocaine in remission) Bipolar affective disorder, Depression, anxiety, and PTSD.  Currently she is being managed on Risperdal 3 mg nightly, gabapentin 100 mg 3 times daily (reports that she became addicted to higher doses), and trazodone 150 mg nightly as needed.  Today she reports her medications are effective in managing her psychiatric condition.  Today she was well-groomed, pleasant, cooperative, and engaged in conversation.  Patient informed Clinical research associate that she has not been eating well.  Since her last visit she notes that she is lost 10 pounds.  Patient notes that she has been unable to afford food.  Provider suggested patient go to [facility] [employer].  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  Since her last visit she notes that her anxiety and depression are well managed. Today provider conducted a GAD 7 and patient scored a 5,  at her last visit she scored an 4.  Provider also conducted PHQ-9 and patient scored a 6, at her last visit she scored a 9.  Today she denies SI/HI/VAH or  paranoia.  Patient reports that her sleep is fluctuates.    Patient informed Clinical research associate that she has lost her insurance coverage.  She notes that family-planning Medicaid.  Provider encouraged patient to come in and discuss her options with [facility] psychotherap[facility].  No medication changes made today.  Patient agreeable to continue medication as scribed.  No other concerns at this time. Visit Diagnosis:    ICD-10-CM   1. Anxiety state  F41.1 risperiDONE (RISPERDAL) 3 MG tablet    traZODone (DESYREL) 150 MG tablet    gabapentin (NEURONTIN) 100 MG capsule    DISCONTINUED: gabapentin (NEURONTIN) 100 MG capsule    2. Bipolar affective disorder, current episode mixed, without psychotic features (HCC)  F31.60 risperiDONE (RISPERDAL) 3 MG tablet    traZODone (DESYREL) 150 MG tablet    gabapentin (NEURONTIN) 100 MG capsule    DISCONTINUED: gabapentin (NEURONTIN) 100 MG capsule       Past Psychiatric History: substance use (Alcohol, Tobacco, meth, and cocaine) Bipolar affective disorder, Depression, anxiety, and PTSD  Past Medical History:  Past Medical History:  Diagnosis Date   Anemia    Chest pain    Depression    Difficulty breathing    Frequent headaches    IBS (irritable bowel syndrome)    Palpitations    Poor circulation     Past Surgical History:  Procedure Laterality Date   CARPAL TUNNEL RELEASE Bilateral    SKIN GRAFT Bilateral     Family Psychiatric History:  Son Substance use, maternal 4 uncles SI, mother abuse  of pill use   Family History:  Family History  Problem Relation Age of Onset   Heart disease Father    Hypertension Father    Diabetes Father    Diabetes Paternal Grandmother    COPD Mother    Glaucoma Mother     Social History:  Social History   Socioeconomic History   Marital status: Single    Spouse name: Not on file   Number of children: Not on file   Years of education: Not on file   Highest education level: Not on file  Occupational  History   Not on file  Tobacco Use   Smoking status: Every Day    Current packs/day: 0.50    Average packs/day: 0.5 packs/day for 35.0 years (17.5 ttl pk-yrs)    Types: Cigarettes   Smokeless tobacco: Never   Tobacco comments:    Pt states she started back smoking 1 month ago and is smoking 1 ppd. ALS 03/31/23  Vaping Use   Vaping status: Never Used  Substance and Sexual Activity   Alcohol use: Not Currently    Comment: 2 months sober.   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   Sexual activity: Not Currently  Other Topics Concern   Not on file  Social History Narrative   Not on file   Social Determinants of Health   Financial Resource Strain: High Risk (01/11/2022)   Overall Financial Resource Strain (CARDIA)    Difficulty of Paying Living Expenses: Hard  Food Insecurity: Low Risk  (04/28/2023)   [facility]   Food vital sign    Within the past 12 months, you worried that your food would run out before you got money to buy more: Never true    Within the past 12 months, the food you bought just didn't last and you didn't have money to get more. : Never true  Transportation Needs: Not on file (04/28/2023)  Physical Activity: Inactive (01/11/2022)   Exercise Vital Sign    Days of Exercise per Week: 0 days    Minutes of Exercise per Session: 0 min  Stress: Stress Concern Present (01/11/2022)   [employer] of Occupational Health - Occupational Stress Questionnaire    Feeling of Stress : Very much  Social Connections: Socially Isolated (01/11/2022)   Social Connection and Isolation Panel [NHANES]    Frequency of Communication with Friends and Family: More than three times a week    Frequency of Social Gatherings with Friends and Family: Three times a week    Attends Religious Services: Never    Active Member of Clubs or Organizations: No    Attends Banker Meetings: Never    Marital Status: Never  married    Allergies:  Allergies  Allergen Reactions   Hydrocodone     Other reaction(s): Unknown/See Comments "Feel really high"    Metabolic Disorder Labs: Lab Results  Component Value Date   HGBA1C 10.5 (A) 08/08/2023   MPG 108.28 12/25/2021   No results found for: "PROLACTIN" Lab Results  Component Value Date   CHOL 182 12/27/2021   TRIG 114 12/27/2021   HDL 49 12/27/2021   CHOLHDL 3.7 12/27/2021   VLDL 23 12/27/2021   LDLCALC 110 (H) 12/27/2021   LDLCALC 130 (H) 12/25/2021   Lab Results  Component Value Date   TSH 0.636 12/25/2021    Therapeutic Level Labs: No results found for: "LITHIUM" No results found  for: "VALPROATE" No results found for: "CBMZ"  Current Medications: Current Outpatient Medications  Medication Sig Dispense Refill   Accu-Chek Softclix Lancets lancets Use as instructed (Patient not taking: Reported on 08/08/2023) 100 each 12   albuterol (VENTOLIN HFA) 108 (90 Base) MCG/ACT inhaler Inhale 2 puffs into the lungs every 6 (six) hours as needed for wheezing or shortness of breath. 18 g 6   azithromycin (ZITHROMAX) 250 MG tablet Take 2 tablets on day 1, then 1 tablet daily on days 2 through 5 6 tablet 0   Blood Glucose Monitoring Suppl (ACCU-CHEK GUIDE) w/Device KIT use as directed once daily (Patient not taking: Reported on 08/08/2023) 1 kit 0   Blood Glucose Monitoring Suppl (TRUE METRIX METER) w/Device KIT Use to check blood sugar 2 (two) times daily. 1 kit 0   budesonide-formoterol (SYMBICORT) 160-4.5 MCG/ACT inhaler Inhale 2 puffs into the lungs 2 (two) times daily. 10.2 g 3   doxycycline (VIBRAMYCIN) 100 MG capsule Take 1 capsule (100 mg total) by mouth 2 (two) times daily for 10 days. (Patient not taking: Reported on 08/08/2023) 20 capsule 0   fluconazole (DIFLUCAN) 200 MG tablet Take 1 tablet (200 mg total) by mouth daily. 1 tablet 1   fluticasone (FLONASE) 50 MCG/ACT nasal spray Place 1 spray into both nostrils 2 (two) times daily. 16 g 3    gabapentin (NEURONTIN) 100 MG capsule Take 1 capsule (100 mg total) by mouth at bedtime. 90 capsule 3   glucose blood (ACCU-CHEK GUIDE) test strip use  2 (two) times daily as instructed (Patient not taking: Reported on 08/08/2023) 100 each 12   glucose blood (TRUE METRIX BLOOD GLUCOSE TEST) test strip Use as instructed 100 each 12   ibuprofen (ADVIL) 800 MG tablet Take 1 tablet (800 mg total) by mouth every 6 (six) hours for mild pain/inflammation. (Patient not taking: Reported on 08/08/2023) 30 tablet 1   ipratropium-albuterol (DUONEB) 0.5-2.5 (3) MG/3ML SOLN Take 3 mLs by nebulization every 6 (six) hours as needed. (Patient not taking: Reported on 08/08/2023) 120 mL 0   metFORMIN (GLUCOPHAGE) 500 MG tablet Take 1 tablet (500 mg total) by mouth 2 (two) times daily with a meal for 7 days, THEN 2 tablets (1,000 mg total) 2 (two) times daily with a meal 360 tablet 1   montelukast (SINGULAIR) 10 MG tablet Take 1 tablet (10 mg total) by mouth daily. Needs office visit. 60 tablet 2   omeprazole (PRILOSEC) 40 MG capsule Take 1 capsule (40 mg total) by mouth at bedtime. (Patient not taking: Reported on 08/08/2023) 60 capsule 2   risperiDONE (RISPERDAL) 3 MG tablet Take 1 tablet (3 mg total) by mouth daily. 90 tablet 1   Tiotropium Bromide Monohydrate (SPIRIVA RESPIMAT) 2.5 MCG/ACT AERS Inhale 2 puffs into the lungs daily. (Patient not taking: Reported on 08/08/2023) 4 g 11   traZODone (DESYREL) 150 MG tablet Take 1 tablet (150 mg total) by mouth at bedtime as needed. 90 tablet 1   TRUEplus Lancets 28G MISC Use to check blood sugar 2 (two) times daily. 100 each 3   No current facility-administered medications for this visit.     Musculoskeletal: Strength & Muscle Tone: within normal limits telehealth visit Gait & Station: normal telehealth visit Patient leans: N/A  Psychiatric Specialty Exam: Review of Systems  There were no vitals taken for this visit.There is no height or weight on file to calculate  BMI.  General Appearance: Well Groomed  Eye Contact:  Good  Speech:  Clear and  Coherent and Normal Rate  Volume:  Normal  Mood:  Euthymic  Affect:  Appropriate and Congruent  Thought Process:  Coherent, Goal Directed, and Linear  Orientation:  Full (Time, Place, and Person)  Thought Content: WDL   Suicidal Thoughts:  No  Homicidal Thoughts:  No  Memory:  Immediate;   Good Recent;   Good Remote;   Good  Judgement:  Good  Insight:  Good  Psychomotor Activity:  Normal  Concentration:  Concentration: Good and Attention Span: Good  Recall:  Good  Fund of Knowledge: Good  Language: Good  Akathisia:  No  Handed:  Left  AIMS (if indicated): not done  Assets:  Communication Skills Desire for Improvement Financial Resources/Insurance Housing Physical Health Social Support  ADL's:  Intact  Cognition: WNL  Sleep:  Fair   Screenings: AIMS    Flowsheet Row Clinical Support from 01/11/2022 in [facility]) [facility]  AIMS Total Score 0 0      AUDIT    Flowsheet Row Admission (Discharged) [facility]  Alcohol Use Disorder Identification Test Final Score (AUDIT) 0      GAD-7    Flowsheet Row Video Visit from 08/10/2023 in [facility] Office Visit from 08/08/2023 in [facility] Video Visit from 05/18/2023 in [facility] Office Visit from 02/28/2023 in [facility] Office Visit from 02/02/2023 in [facility]  Total GAD-7 Score 5 2 4 8 3       PHQ2-9    Flowsheet Row Video Visit from 08/10/2023 in [facility] Office Visit from 08/08/2023 in [facility] Video Visit from 05/18/2023 in [facility] Office Visit from 02/28/2023 in [facility] Office Visit from 02/02/2023 in [facility]  PHQ-2 Total Score 0 1 3 1  0  PHQ-9 Total Score 6 -- 9 4 7       Flowsheet Row [facility]/11/2022 in [facility] Ad[facility] (Discharged[facility]  C-SSRS RISK CATEGORY Low Risk High Risk No Risk        Assessment and Plan: Patient informed writer she has lost 10 pounds since her last visit.  She reports that she is unable to afford food due to finances.  Provider suggest that patient follow-up at [facility] [employer].  She notes that she has been there once but finds it hard to get as she works.  She does note that her mood is stable and inform her that she has minimal anxiety and depression.No medication changes made today.  Patient agreeable to continue medication as scribed.   1. Anxiety state  Continue- risperiDONE (RISPERDAL) 3 MG tablet; Take 1 tablet (3 mg total) by mouth daily.  Dispense: 90 tablet; Refill: 1 Continue- traZODone (DESYREL) 150 MG tablet; Take 1 tablet (150 mg total) by mouth at bedtime as needed.  Dispense: 90 tablet; Refill: 1 Continue- gabapentin (NEURONTIN) 100 MG capsule; Take 1 capsule (100 mg total) by mouth at bedtime.  Dispense: 90 capsule; Refill: 3  2. Bipolar affective disorder, current episode mixed, without psychotic features (HCC)  Continue- risperiDONE (RISPERDAL) 3 MG tablet;  Take 1 tablet (3 mg total) by mouth daily.  Dispense: 90 tablet; Refill: 1 Continue- traZODone (DESYREL) 150 MG tablet; Take 1 tablet (150 mg total) by mouth at bedtime as needed.  Dispense: 90 tablet; Refill: 1 Continue- gabapentin (NEURONTIN) 100 MG capsule; Take 1 capsule (100 mg total) by mouth at bedtime.  Dispense: 90 capsule; Refill:  3  Collaboration of Care: Collaboration of Care: Other provider involved in patient's care AEB PCP  Patient/Guardian was advised Release of Information must be obtained prior to any record release in order to collaborate their care with an outside provider. Patient/Guardian was advised if they have not already done so to contact the registration department to sign all necessary forms in order for [place] to release information regarding their care.   Consent: Patient/Guardian gives verbal consent for treatment and assignment of benefits for services provided during this visit. Patient/Guardian expressed understanding and agreed to proceed.   Follow-up in 3 months Shanna Cisco, NP 08/10/2023, 9:30 AM

## 2023-09-07 ENCOUNTER — Other Ambulatory Visit (HOSPITAL_COMMUNITY): Payer: Self-pay

## 2023-09-08 ENCOUNTER — Telehealth: Payer: Self-pay | Admitting: Pharmacist

## 2023-09-08 ENCOUNTER — Encounter: Payer: Self-pay | Admitting: Pharmacist

## 2023-09-08 ENCOUNTER — Other Ambulatory Visit: Payer: Self-pay

## 2023-09-08 ENCOUNTER — Ambulatory Visit: Payer: Medicaid Other | Attending: Critical Care Medicine | Admitting: Pharmacist

## 2023-09-08 DIAGNOSIS — Z7984 Long term (current) use of oral hypoglycemic drugs: Secondary | ICD-10-CM

## 2023-09-08 DIAGNOSIS — E1165 Type 2 diabetes mellitus with hyperglycemia: Secondary | ICD-10-CM

## 2023-09-08 LAB — POCT GLYCOSYLATED HEMOGLOBIN (HGB A1C): HbA1c, POC (controlled diabetic range): 9.6 % — AB (ref 0.0–7.0)

## 2023-09-08 MED ORDER — GLIPIZIDE ER 5 MG PO TB24
5.0000 mg | ORAL_TABLET | Freq: Every day | ORAL | 0 refills | Status: DC
  Filled 2023-09-08: qty 30, 30d supply, fill #0

## 2023-09-08 MED ORDER — METFORMIN HCL ER 500 MG PO TB24
500.0000 mg | ORAL_TABLET | Freq: Two times a day (BID) | ORAL | 0 refills | Status: DC
  Filled 2023-09-08 – 2023-10-13 (×2): qty 180, 90d supply, fill #0

## 2023-09-08 NOTE — Telephone Encounter (Signed)
LIBERATE Study  Received referral for patient participation in the LIBERATE CGM Study. Contacted patient to discuss study and confirmed HIPAA identifiers. Confirmed patient was provided the LIBERATE Study Information Sheet and any questions were answered.   Confirmed that patient meets study criteria by having a diagnosis of Type 2 Diabetes, is not currently on insulin, and most recent A1c is >8%.  Patient provided verbal consent to participate in the study. Consent documented in electronic medical record.   @[email or web address] @  - Confirmed that patient has a compatible smart phone to [employer] app. ([email or web address]) - Asked to download and create a [name] account prior to first study visit.  - Scheduled first study visit today. Confirmed patient has transportation to this appointment.  - Discussed use of MyChart in this study. Confirmed patient has an active MyChart account and is aware of their log in information.  Patient aware of pre-visit questionnaire that will be sent 2 days prior to their scheduled study visit and they will plan to complete before the visit.   Butch Penny, PharmD, [place]y [place], CPP Clinical Pharmacist [facility] [phone]
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
## 2023-09-15 ENCOUNTER — Telehealth: Payer: Self-pay | Admitting: Pharmacist

## 2023-09-15 NOTE — Telephone Encounter (Signed)
LIBERATE Study  Patient completed first study visit for the LIBERATE CGM Study. Contacted patient to discuss CGM tolerability. Confirmed HIPAA identifiers.   Date of Download: today % Time CGM is active: 44% Average Glucose: 166 mg/dL Glucose Management Indicator: 7.3  Glucose Variability: 27.7 (goal <36%) Time in Goal:  - Time in range 70-180: 63% - Time above range: 37% - Time below range: 0% Observed patterns:  Patient confirms Libre 3 sensors is working and glucose values are transmitting appropriately. Denies any questions or concerns.   - Confirmed second study visit on 10/13/23. Confirmed patient has transportation to this appointment.  - Discussed use of MyChart in this study. Confirmed patient has an active MyChart account and is aware of their log in information.  Patient aware of pre-visit questionnaire that will be sent 2 days prior to their scheduled study visit and they will plan to complete before the visit.    Butch Penny, PharmD, Patsy [place], CPP Clinical Pharmacist [facility] & [facility] [phone]

## 2023-09-22 ENCOUNTER — Other Ambulatory Visit: Payer: Self-pay

## 2023-09-22 ENCOUNTER — Other Ambulatory Visit (HOSPITAL_COMMUNITY): Payer: Self-pay

## 2023-10-04 ENCOUNTER — Other Ambulatory Visit: Payer: Self-pay

## 2023-10-13 ENCOUNTER — Encounter: Payer: Self-pay | Admitting: Pharmacist

## 2023-10-13 ENCOUNTER — Ambulatory Visit: Payer: Medicaid Other | Attending: Critical Care Medicine | Admitting: Pharmacist

## 2023-10-13 ENCOUNTER — Other Ambulatory Visit: Payer: Self-pay

## 2023-10-13 DIAGNOSIS — Z7984 Long term (current) use of oral hypoglycemic drugs: Secondary | ICD-10-CM

## 2023-10-13 DIAGNOSIS — E1165 Type 2 diabetes mellitus with hyperglycemia: Secondary | ICD-10-CM
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
## 2023-10-25 ENCOUNTER — Other Ambulatory Visit: Payer: Self-pay

## 2023-11-01 ENCOUNTER — Other Ambulatory Visit (HOSPITAL_BASED_OUTPATIENT_CLINIC_OR_DEPARTMENT_OTHER): Payer: Self-pay

## 2023-11-01 ENCOUNTER — Other Ambulatory Visit (HOSPITAL_COMMUNITY): Payer: Self-pay

## 2023-11-02 ENCOUNTER — Encounter (HOSPITAL_COMMUNITY): Payer: Self-pay | Admitting: Psychiatry

## 2023-11-02 ENCOUNTER — Other Ambulatory Visit: Payer: Self-pay

## 2023-11-02 ENCOUNTER — Other Ambulatory Visit (HOSPITAL_COMMUNITY): Payer: Self-pay

## 2023-11-02 ENCOUNTER — Telehealth (INDEPENDENT_AMBULATORY_CARE_PROVIDER_SITE_OTHER): Payer: Self-pay | Admitting: Psychiatry

## 2023-11-02 DIAGNOSIS — F411 Generalized anxiety disorder: Secondary | ICD-10-CM

## 2023-11-02 DIAGNOSIS — F3160 Bipolar disorder, current episode mixed, unspecified: Secondary | ICD-10-CM

## 2023-11-02 MED ORDER — TRAZODONE HCL 150 MG PO TABS
150.0000 mg | ORAL_TABLET | Freq: Every evening | ORAL | 1 refills | Status: DC | PRN
  Filled 2023-11-02 (×2): qty 90, 90d supply, fill #0
  Filled 2024-01-14: qty 90, 90d supply, fill #1
  Filled 2024-01-16: qty 30, 30d supply, fill #1

## 2023-11-02 MED ORDER — RISPERIDONE 3 MG PO TABS
3.0000 mg | ORAL_TABLET | Freq: Every day | ORAL | 1 refills | Status: DC
  Filled 2023-11-02 – 2023-12-15 (×3): qty 90, 90d supply, fill #0

## 2023-11-02 MED ORDER — GABAPENTIN 100 MG PO CAPS
100.0000 mg | ORAL_CAPSULE | Freq: Every day | ORAL | 3 refills | Status: DC
  Filled 2023-11-02 (×2): qty 90, 90d supply, fill #0
  Filled 2024-01-14: qty 90, 90d supply, fill #1
  Filled 2024-01-16: qty 30, 30d supply, fill #1

## 2023-11-02 NOTE — Progress Notes (Signed)
BH MD/PA/NP OP Progress Note Virtual Visit via Video Note  I connected with Candace Howard on 11/02/23 at 10:30 AM EST by a video enabled telemedicine application and verified that I am speaking with the correct person using two identifiers.  Location: Patient: Work Provider: Clinic   I discussed the limitations of evaluation and management by telemedicine and the availability of in person appointments. The patient expressed understanding and agreed to proceed.  I provided 30 minutes of non-face-to-face time during this encounter.   11/02/2023 10:39 AM Candace Howard  MRN:  [id number]  Chief Complaint: "I had to split my trazodone up for 4 days"  HPI: 52 year old female seen today for follow-up psychiatric evaluation.  She has a psychiatric history of substance use (Alcohol, Tobacco, meth, and cocaine in remission) Bipolar affective disorder, Depression, anxiety, and PTSD.  Currently she is being managed on Risperdal 3 mg nightly, gabapentin 100 mg 3 times daily (reports that she became addicted to higher doses), and trazodone 150 mg nightly as needed.  Today she reports her medications are effective in managing her psychiatric condition.  Today she was well-groomed, pleasant, cooperative, and engaged in conversation.  Patient informed Clinical research associate that she was without her trazodone for 4 days because she could not afford it. She now notes that she has been somewhat tired, irritable, distracted, and fluctuations in mood. Patient also notes that her appetite has been poor she notes that she lost 10 pounds.  Patient also informed Clinical research associate that work continues to be slow.  She informed Clinical research associate that she continues to have food insecurity.  She informed Clinical research associate that she only gets $20 a month and food stamps.  Provider informed patient of [employer].  She endorsed understanding.  Patient notes that she is worried about her son who is in active addiction.    Despite the above stressors patient reports that  her anxiety and depression are well managed. Today provider conducted a GAD 7 and patient scored a 6,  at her last visit she scored an 5.  Provider also conducted PHQ-9 and patient scored a 10, at her last visit she scored a 6.  Today she denies SI/HI/VAH or paranoia.    No medication changes made today.  Patient agreeable to continue medication as scribed.  Patient has not had routine psychiatric labs in a while.  Today provider ordered prolactin level, thyroid panel, LFT, lipid panel, UDS, HgbA1c, CBC, and CMP.  No other concerns at this time. Visit Diagnosis:    ICD-10-CM   1. Anxiety state  F41.1 gabapentin (NEURONTIN) 100 MG capsule    risperiDONE (RISPERDAL) 3 MG tablet    traZODone (DESYREL) 150 MG tablet    2. Bipolar affective disorder, current episode mixed, without psychotic features (HCC)  F31.60 Prolactin    Thyroid Panel With TSH    Hepatic function panel    Lipid Profile    POCT Urine Drug Screen    HgB A1c    CBC w/Diff/Platelet    Comprehensive Metabolic Panel (CMET)    gabapentin (NEURONTIN) 100 MG capsule    risperiDONE (RISPERDAL) 3 MG tablet    traZODone (DESYREL) 150 MG tablet        Past Psychiatric History: substance use (Alcohol, Tobacco, meth, and cocaine) Bipolar affective disorder, Depression, anxiety, and PTSD  Past Medical History:  Past Medical History:  Diagnosis Date   Anemia    Chest pain    Depression    Difficulty breathing    Frequent headaches  IBS (irritable bowel syndrome)    Palpitations    Poor circulation     Past Surgical History:  Procedure Laterality Date   CARPAL TUNNEL RELEASE Bilateral    SKIN GRAFT Bilateral     Family Psychiatric History:  Son Substance use, maternal 4 uncles SI, mother abuse of pill use   Family History:  Family History  Problem Relation Age of Onset   Heart disease Father    Hypertension Father    Diabetes Father    Diabetes Paternal Grandmother    COPD Mother    Glaucoma Mother      Social History:  Social History   Socioeconomic History   Marital status: Single    Spouse name: Not on file   Number of children: Not on file   Years of education: Not on file   Highest education level: Not on file  Occupational History   Not on file  Tobacco Use   Smoking status: Every Day    Current packs/day: 0.50    Average packs/day: 0.5 packs/day for 35.0 years (17.5 ttl pk-yrs)    Types: Cigarettes   Smokeless tobacco: Never   Tobacco comments:    Pt states she started back smoking 1 month ago and is smoking 1 ppd. ALS 03/31/23  Vaping Use   Vaping status: Never Used  Substance and Sexual Activity   Alcohol use: Not Currently    Comment: 2 months sober.   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   Sexual activity: Not Currently  Other Topics Concern   Not on file  Social History Narrative   Not on file   Social Determinants of Health   Financial Resource Strain: High Risk (01/11/2022)   Overall Financial Resource Strain (CARDIA)    Difficulty of Paying Living Expenses: Hard  Food Insecurity: Low Risk  (04/28/2023)   [facility]   Hunger Vital Sign    Worried About Running Out of Food in the Last Year: Never true    Ran Out of Food in the Last Year: Never true  Transportation Needs: Not on file (04/28/2023)  Physical Activity: Inactive (01/11/2022)   Exercise Vital Sign    Days of Exercise per Week: 0 days    Minutes of Exercise per Session: 0 min  Stress: Stress Concern Present (01/11/2022)   [employer] of Occupational Health - Occupational Stress Questionnaire    Feeling of Stress : Very much  Social Connections: Socially Isolated (01/11/2022)   Social Connection and Isolation Panel [NHANES]    Frequency of Communication with Friends and Family: More than three times a week    Frequency of Social Gatherings with Friends and Family: Three times a week    Attends Religious Services: Never     Active Member of Clubs or Organizations: No    Attends Banker Meetings: Never    Marital Status: Never married    Allergies:  Allergies  Allergen Reactions   Hydrocodone     Other reaction(s): Unknown/See Comments "Feel really high"    Metabolic Disorder Labs: Lab Results  Component Value Date   HGBA1C 9.6 (A) 09/08/2023   MPG 108.28 12/25/2021   No results found for: "PROLACTIN" Lab Results  Component Value Date   CHOL 182 12/27/2021   TRIG 114 12/27/2021   HDL 49 12/27/2021   CHOLHDL 3.7 12/27/2021   VLDL 23 12/27/2021   LDLCALC 110 (H) 12/27/2021   LDLCALC  130 (H) 12/25/2021   Lab Results  Component Value Date   TSH 0.636 12/25/2021    Therapeutic Level Labs: No results found for: "LITHIUM" No results found for: "VALPROATE" No results found for: "CBMZ"  Current Medications: Current Outpatient Medications  Medication Sig Dispense Refill   Accu-Chek Softclix Lancets lancets Use as instructed (Patient not taking: Reported on 08/08/2023) 100 each 12   albuterol (VENTOLIN HFA) 108 (90 Base) MCG/ACT inhaler Inhale 2 puffs into the lungs every 6 (six) hours as needed for wheezing or shortness of breath. 18 g 6   Blood Glucose Monitoring Suppl (ACCU-CHEK GUIDE) w/Device KIT use as directed once daily (Patient not taking: Reported on 08/08/2023) 1 kit 0   Blood Glucose Monitoring Suppl (TRUE METRIX METER) w/Device KIT Use to check blood sugar 2 (two) times daily. 1 kit 0   budesonide-formoterol (SYMBICORT) 160-4.5 MCG/ACT inhaler Inhale 2 puffs into the lungs 2 (two) times daily. 10.2 g 3   doxycycline (VIBRAMYCIN) 100 MG capsule Take 1 capsule (100 mg total) by mouth 2 (two) times daily for 10 days. (Patient not taking: Reported on 08/08/2023) 20 capsule 0   fluconazole (DIFLUCAN) 200 MG tablet Take 1 tablet (200 mg total) by mouth daily. 1 tablet 1   fluticasone (FLONASE) 50 MCG/ACT nasal spray Place 1 spray into both nostrils 2 (two) times daily. 16 g 3    gabapentin (NEURONTIN) 100 MG capsule Take 1 capsule (100 mg total) by mouth at bedtime. 90 capsule 3   glucose blood (ACCU-CHEK GUIDE) test strip use  2 (two) times daily as instructed (Patient not taking: Reported on 08/08/2023) 100 each 12   glucose blood (TRUE METRIX BLOOD GLUCOSE TEST) test strip Use as instructed 100 each 12   ibuprofen (ADVIL) 800 MG tablet Take 1 tablet (800 mg total) by mouth every 6 (six) hours for mild pain/inflammation. (Patient not taking: Reported on 08/08/2023) 30 tablet 1   ipratropium-albuterol (DUONEB) 0.5-2.5 (3) MG/3ML SOLN Take 3 mLs by nebulization every 6 (six) hours as needed. (Patient not taking: Reported on 08/08/2023) 120 mL 0   metFORMIN (GLUCOPHAGE-XR) 500 MG 24 hr tablet Take 1 tablet (500 mg total) by mouth 2 (two) times daily with a meal. 180 tablet 0   montelukast (SINGULAIR) 10 MG tablet Take 1 tablet (10 mg total) by mouth daily. Needs office visit. 60 tablet 2   omeprazole (PRILOSEC) 40 MG capsule Take 1 capsule (40 mg total) by mouth at bedtime. (Patient not taking: Reported on 08/08/2023) 60 capsule 2   risperiDONE (RISPERDAL) 3 MG tablet Take 1 tablet (3 mg total) by mouth daily. 90 tablet 1   Tiotropium Bromide Monohydrate (SPIRIVA RESPIMAT) 2.5 MCG/ACT AERS Inhale 2 puffs into the lungs daily. (Patient not taking: Reported on 08/08/2023) 4 g 11   traZODone (DESYREL) 150 MG tablet Take 1 tablet (150 mg total) by mouth at bedtime as needed. 90 tablet 1   TRUEplus Lancets 28G MISC Use to check blood sugar 2 (two) times daily. 100 each 3   No current facility-administered medications for this visit.     Musculoskeletal: Strength & Muscle Tone: within normal limits telehealth visit Gait & Station: normal telehealth visit Patient leans: N/A  Psychiatric Specialty Exam: Review of Systems  There were no vitals taken for this visit.There is no height or weight on file to calculate BMI.  General Appearance: Well Groomed  Eye Contact:  Good   Speech:  Clear and Coherent and Normal Rate  Volume:  Normal  Mood:  Euthymic  Affect:  Appropriate and Congruent  Thought Process:  Coherent, Goal Directed, and Linear  Orientation:  Full (Time, Place, and Person)  Thought Content: WDL   Suicidal Thoughts:  No  Homicidal Thoughts:  No  Memory:  Immediate;   Good Recent;   Good Remote;   Good  Judgement:  Good  Insight:  Good  Psychomotor Activity:  Normal  Concentration:  Concentration: Good and Attention Span: Good  Recall:  Good  Fund of Knowledge: Good  Language: Good  Akathisia:  No  Handed:  Left  AIMS (if indicated): not done  Assets:  Communication Skills Desire for Improvement Financial Resources/Insurance Housing Physical Health Social Support  ADL's:  Intact  Cognition: WNL  Sleep:  Good   Screenings: AIMS    Flowsheet Row Clinical Support from 01/11/2022 in [facility]  AIMS Total Score 0 0      AUDIT    Flowsheet Row Ad[facility] (Discharged[facility]  Alcohol Use Disorder Identification Test Final Score (AUDIT) 0      GAD-7    Flowsheet Row Video Visit from 11/02/2023 in [facility] Video Visit from 08/10/2023 in [facility] Office Visit from 08/08/2023 in [facility] [place] [facility] [place]. [facility] Video Visit from 05/18/2023 in [facility] Office Visit from 02/28/2023 in [facility]  Total GAD-7 Score 6 5 2 4 8       PHQ2-9    Flowsheet Row Video Visit from 11/02/2023 in [facility] Video Visit from 08/10/2023 in [facility] Office Visit from 08/08/2023 in [facility] [place] [facility] [place]. [facility] Video  Visit from 05/18/2023 in [facility] Office Visit from 02/28/2023 in [facility]  PHQ-2 Total Score 2 0 1 3 1   PHQ-9 Total Score 10 6 -- 9 4      Flowsheet Row Office Visit from 02/28/2023 in [facility] Counselor from 01/11/2022 in [facility] Admission (Discharged) from [facility]  C-SSRS RISK CATEGORY Low Risk High Risk No Risk        Assessment and Plan: Patient informed writer her stressors includes her son addiction, food insecurity, and finances.  She however reports that she is able to cope with it.No medication changes made today.  Patient agreeable to continue medication as scribed.  Patient has not had routine psychiatric labs in a while.  Today provider ordered prolactin level, thyroid panel, LFT, lipid panel, UDS, HgbA1c, CBC, and CMP.  1. Anxiety state  Continue- gabapentin (NEURONTIN) 100 MG capsule; Take 1 capsule (100 mg total) by mouth at bedtime.  Dispense: 90 capsule; Refill: 3 Continue- risperiDONE (RISPERDAL) 3 MG tablet; Take 1 tablet (3 mg total) by mouth daily.  Dispense: 90 tablet; Refill: 1 Continue- traZODone (DESYREL) 150 MG tablet; Take 1 tablet (150 mg total) by mouth at bedtime as needed.  Dispense: 90 tablet; Refill: 1  2. Bipolar affective disorder, current episode mixed, without psychotic features (HCC)  - Prolactin - Thyroid Panel With TSH - Hepatic function panel - Lipid Profile - POCT Urine Drug Screen - HgB A1c - CBC w/Diff/Platelet - Comprehensive Metabolic  Panel (CMET) Continue- gabapentin (NEURONTIN) 100 MG capsule; Take 1 capsule (100 mg total) by mouth at bedtime.  Dispense: 90 capsule; Refill: 3 Continue- risperiDONE (RISPERDAL) 3 MG tablet; Take 1 tablet (3 mg total) by mouth daily.  Dispense: 90 tablet; Refill: 1 Continue- traZODone (DESYREL) 150 MG tablet; Take 1 tablet (150 mg  total) by mouth at bedtime as needed.  Dispense: 90 tablet; Refill: 1  Collaboration of Care: Collaboration of Care: Other provider involved in patient's care AEB PCP  Patient/Guardian was advised Release of Information must be obtained prior to any record release in order to collaborate their care with an outside provider. Patient/Guardian was advised if they have not already done so to contact the registration department to sign all necessary forms in order for [place] to release information regarding their care.   Consent: Patient/Guardian gives verbal consent for treatment and assignment of benefits for services provided during this visit. Patient/Guardian expressed understanding and agreed to proceed.   Follow-up in 2.5 months Shanna Cisco, NP 11/02/2023, 10:39 AM

## 2023-11-03 ENCOUNTER — Ambulatory Visit: Payer: Self-pay | Admitting: *Deleted

## 2023-11-03 NOTE — Telephone Encounter (Signed)
  Chief Complaint: urinary pain Symptoms: pain with urination- every time- 2 days ago, patient states she has to wear pad- incontinent of urine and feces at times Frequency: urinary symptoms-2 days Pertinent Negatives: Patient denies fever Disposition: [] ED /[] Urgent Care (no appt availability in office) / [x] Appointment(In office/virtual)/ []  [facility]/ [] Home Care/ [] Refused Recommended Disposition /[] [facility]/ []  Follow-up with PCP Additional Notes: Patient is at work and is pleased with Saturday appointment-  advised if she gets worse be seen before appointment

## 2023-11-03 NOTE — Telephone Encounter (Signed)
Reason for Disposition  Age > 50 years  Answer Assessment - Initial Assessment Questions 1. SEVERITY: "How bad is the pain?"  (e.g., Scale 1-10; mild, moderate, or severe)   - MILD (1-3): complains slightly about urination hurting   - MODERATE (4-7): interferes with normal activities     - SEVERE (8-10): excruciating, unwilling or unable to urinate because of the pain      Left sided urethral pain- mild,2-3/10 2. FREQUENCY: "How many times have you had painful urination today?"      Every time- 3 times 3. PATTERN: "Is pain present every time you urinate or just sometimes?"      Yes- every time 4. ONSET: "When did the painful urination start?"      Started 2 days ago 5. FEVER: "Do you have a fever?" If Yes, ask: "What is your temperature, how was it measured, and when did it start?"     no 6. PAST UTI: "Have you had a urine infection before?" If Yes, ask: "When was the last time?" and "What happened that time?"      Yes- years ago 7. CAUSE: "What do you think is causing the painful urination?"  (e.g., UTI, scratch, Herpes sore)     Possible UTI 8. OTHER SYMPTOMS: "Do you have any other symptoms?" (e.g., blood in urine, flank pain, genital sores, urgency, vaginal discharge)     Some urinary incontinence- 6 months  Protocols used: Urination Pain - Female-A-AH

## 2023-11-05 ENCOUNTER — Encounter: Payer: Self-pay | Admitting: Family

## 2023-11-05 ENCOUNTER — Ambulatory Visit: Payer: MEDICAID | Attending: Family | Admitting: Family

## 2023-11-05 ENCOUNTER — Other Ambulatory Visit (HOSPITAL_COMMUNITY): Payer: Self-pay

## 2023-11-05 VITALS — BP 124/79 | HR 71 | Wt 155.0 lb

## 2023-11-05 DIAGNOSIS — E119 Type 2 diabetes mellitus without complications: Secondary | ICD-10-CM

## 2023-11-05 DIAGNOSIS — F17200 Nicotine dependence, unspecified, uncomplicated: Secondary | ICD-10-CM

## 2023-11-05 DIAGNOSIS — Z7182 Exercise counseling: Secondary | ICD-10-CM | POA: Diagnosis not present

## 2023-11-05 DIAGNOSIS — R300 Dysuria: Secondary | ICD-10-CM

## 2023-11-05 DIAGNOSIS — Z7984 Long term (current) use of oral hypoglycemic drugs: Secondary | ICD-10-CM | POA: Diagnosis not present

## 2023-11-05 LAB — POCT URINALYSIS DIP (CLINITEK)
Bilirubin, UA: NEGATIVE
Blood, UA: NEGATIVE
Glucose, UA: 100 mg/dL — AB
Ketones, POC UA: NEGATIVE mg/dL
Leukocytes, UA: NEGATIVE
Nitrite, UA: NEGATIVE
POC PROTEIN,UA: NEGATIVE
Spec Grav, UA: 1.02 (ref 1.010–1.025)
Urobilinogen, UA: 1 U/dL
pH, UA: 6 (ref 5.0–8.0)

## 2023-11-05 MED ORDER — FREESTYLE LIBRE 3 SENSOR MISC
12 refills | Status: DC
  Filled 2023-11-05: qty 3, 28d supply, fill #0

## 2023-11-05 NOTE — Progress Notes (Addendum)
Candace Howard, is a 52 y.o. female  [id number]  [id number]  DOB - [birth year]  Subjective:  Chief Complaint and HPI: Candace Howard is a 52 y.o. female here today with complaints of burning with urination and pain to left side of her bladder. Symptoms started about 4 days ago. Pain to bladder is on the left side. She denies lower back pain, vaginal discharge, fever, chills, recent sexual activity, nausea, vomiting, diarrhea, fever, chills, polyuria, polyphagia, polydipsia, chest pain or shortness of breath. Patient reports she wears pull ups and sometimes has accidents in them. She reports history of incontinence of both the stools and a urine. Patient has history of nicotine use, she quit smoking for about 1 week and went back to smoking about 2 packs per day. Reports mother had cancer from smoking. Patient reports she is in a local recovery program. She has history of type 2 diabetes, with most recent hemoglobin A1c of 9.6% in September of this year. She reports BG 130-300 mg/dl at home. She did not tolerate her glipizide (nightmares). Patient reports taking her medications as prescribed.  ED/Hospital notes reviewed.   Social History: reviewed  Family history: reviewed  ROS:   Constitutional:  No f/c, No night sweats, No unexplained weight loss. EENT:  No vision changes, No blurry vision, No hearing changes. No mouth, throat, or ear problems.  Respiratory: No cough, No SOB Cardiac: No CP, no palpitations GI:  No abd pain, No N/V/D. GU: + burning with urination and pain to left side of bladder. Musculoskeletal: No joint pain Neuro: No headache, no dizziness, no motor weakness.  Skin: No rash Endocrine:  No polydipsia. No polyuria.  Psych: Denies SI/HI  No problems updated.  ALLERGIES: Allergies  Allergen Reactions   Hydrocodone     Other reaction(s): Unknown/See Comments "Feel really high"    PAST MEDICAL HISTORY: Past Medical History:  Diagnosis Date   Anemia     Chest pain    Depression    Difficulty breathing    Frequent headaches    IBS (irritable bowel syndrome)    Palpitations    Poor circulation     MEDICATIONS AT HOME: Prior to Admission medications   Medication Sig Start Date End Date Taking? Authorizing Provider  Accu-Chek Softclix Lancets lancets Use as instructed 04/01/22  Yes Mayers, Cari S, PA-C  albuterol (VENTOLIN HFA) 108 (90 Base) MCG/ACT inhaler Inhale 2 puffs into the lungs every 6 (six) hours as needed for wheezing or shortness of breath. 08/08/23  Yes Georgian Co M, PA-C  Blood Glucose Monitoring Suppl (ACCU-CHEK GUIDE) w/Device KIT use as directed once daily 05/20/23  Yes Georganna Skeans, MD  Blood Glucose Monitoring Suppl (TRUE METRIX METER) w/Device KIT Use to check blood sugar 2 (two) times daily. 08/08/23  Yes McClung, Angela M, PA-C  budesonide-formoterol (SYMBICORT) 160-4.5 MCG/ACT inhaler Inhale 2 puffs into the lungs 2 (two) times daily. 08/08/23  Yes Anders Simmonds, PA-C  doxycycline (VIBRAMYCIN) 100 MG capsule Take 1 capsule (100 mg total) by mouth 2 (two) times daily for 10 days. 04/14/23  Yes   fluconazole (DIFLUCAN) 200 MG tablet Take 1 tablet (200 mg total) by mouth daily. 08/08/23  Yes McClung, Angela M, PA-C  fluticasone (FLONASE) 50 MCG/ACT nasal spray Place 1 spray into both nostrils 2 (two) times daily. 08/08/23  Yes Anders Simmonds, PA-C  gabapentin (NEURONTIN) 100 MG capsule Take 1 capsule (100 mg total) by mouth at bedtime. 11/02/23  Yes Shanna Cisco, NP  glucose  blood (ACCU-CHEK GUIDE) test strip use  2 (two) times daily as instructed 05/20/23  Yes Georganna Skeans, MD  glucose blood (TRUE METRIX BLOOD GLUCOSE TEST) test strip Use as instructed 08/08/23  Yes McClung, Angela M, PA-C  ibuprofen (ADVIL) 800 MG tablet Take 1 tablet (800 mg total) by mouth every 6 (six) hours for mild pain/inflammation. 04/18/23  Yes   ipratropium-albuterol (DUONEB) 0.5-2.5 (3) MG/3ML SOLN Take 3 mLs by nebulization every 6  (six) hours as needed. 04/27/22  Yes Storm Frisk, MD  metFORMIN (GLUCOPHAGE-XR) 500 MG 24 hr tablet Take 1 tablet (500 mg total) by mouth 2 (two) times daily with a meal. 09/08/23  Yes Storm Frisk, MD  montelukast (SINGULAIR) 10 MG tablet Take 1 tablet (10 mg total) by mouth daily. Needs office visit. 08/08/23  Yes Georgian Co M, PA-C  omeprazole (PRILOSEC) 40 MG capsule Take 1 capsule (40 mg total) by mouth at bedtime. 02/02/23  Yes Storm Frisk, MD  risperiDONE (RISPERDAL) 3 MG tablet Take 1 tablet (3 mg total) by mouth daily. 11/02/23  Yes Toy Cookey E, NP  Tiotropium Bromide Monohydrate (SPIRIVA RESPIMAT) 2.5 MCG/ACT AERS Inhale 2 puffs into the lungs daily. 03/31/23  Yes Hunsucker, Lesia Sago, MD  traZODone (DESYREL) 150 MG tablet Take 1 tablet (150 mg total) by mouth at bedtime as needed. 11/02/23  Yes Toy Cookey E, NP  TRUEplus Lancets 28G MISC Use to check blood sugar 2 (two) times daily. 08/08/23  Yes McClung, Marzella Schlein, PA-C     Objective:  EXAM:   Vitals:   11/05/23 0909  BP: 124/79  Pulse: 71  SpO2: 96%  Weight: 155 lb (70.3 kg)   General appearance : A&OX3. NAD. Non-toxic-appearing HEENT: Atraumatic and Normocephalic.  PERRLA. EOM intact.  TM clear B. Mouth-MMM, post pharynx WNL w/o erythema, No PND. Neck: supple, no JVD. No cervical lymphadenopathy. No thyromegaly Chest/Lungs:  Breathing-non-labored, Good air entry bilaterally, breath sounds normal without rales, rhonchi, or wheezing  CVS: S1 S2 regular, no murmurs, gallops, rubs  Abdomen: Bowel sounds present, Non tender and not distended with no gaurding, rigidity or rebound. Extremities: Bilateral Lower Ext shows no edema, both legs are warm to touch with = pulse throughout Neurology:  CN II-XII grossly intact, Non focal.   Psych:  TP linear. J/I WNL. Normal speech. Appropriate eye contact and affect.  Skin:  No Rash  Data Review Lab Results  Component Value Date   HGBA1C 9.6 (A)  09/08/2023   HGBA1C 10.5 (A) 08/08/2023   HGBA1C 7.1 (A) 02/02/2023     Assessment & Plan  Patient is a 52 year old female here this morning with complaints of burning with urination and pain to the left side of her bladder.  Urinalysis is negative for nitrates and leukocytes, urine culture is pending.  Patient has type 2 diabetes managed by her primary care provider.  She also has history of tobacco abuse unable to quit completely, she is part of a support group.  Plan:  1. Dysuria - POCT URINALYSIS DIP (CLINITEK) negative for nitrates and leukocytes.  - Urine Culture: pending - Reminded to stay hydrated - Patient to report new or worsening symptoms to the clinic or local ED  2. New onset type 2 diabetes mellitus (HCC) - Follow-up with primary care as scheduled - Monitor blood sugars as indicated, CGM sensors refilled. - Report consistent blood sugars less than 70 or greater than 250 to the clinic or local emergency room - Increase physical activities and  eat plant-based diet - Schedule for eye exam and take medications as prescribed - Follow-up with primary care provider in 3 months.   3. Tobacco dependence: - In a recovery program  -Cut back on tobacco consumption -Follow-up with primary care provider as scheduled for tobacco cessation treatment.   Patient have been counseled extensively about nutrition and exercise  No follow-ups on file.  The patient was given clear instructions to go to ER or return to medical center if symptoms don't improve, worsen or new problems develop. The patient verbalized understanding. The patient was told to call to get lab results if they haven't heard anything in the next week.     Eleonore Chiquito, DNP, APRN, FNP-C [facility] and [facility] [place], [place] [phone]   11/05/2023, 9:31 AM

## 2023-11-05 NOTE — Addendum Note (Signed)
Addended by: Dalene Carrow I on: 11/05/2023 11:58 AM   Modules accepted: Orders

## 2023-11-07 ENCOUNTER — Other Ambulatory Visit: Payer: Self-pay

## 2023-11-08 ENCOUNTER — Encounter: Payer: Self-pay | Admitting: Family Medicine

## 2023-11-08 ENCOUNTER — Ambulatory Visit: Payer: MEDICAID | Attending: Family Medicine | Admitting: Family Medicine

## 2023-11-08 ENCOUNTER — Other Ambulatory Visit: Payer: Self-pay

## 2023-11-08 ENCOUNTER — Other Ambulatory Visit (HOSPITAL_COMMUNITY): Payer: Self-pay

## 2023-11-08 VITALS — BP 126/79 | HR 81 | Ht 64.0 in | Wt 155.6 lb

## 2023-11-08 DIAGNOSIS — N3281 Overactive bladder: Secondary | ICD-10-CM | POA: Insufficient documentation

## 2023-11-08 DIAGNOSIS — J449 Chronic obstructive pulmonary disease, unspecified: Secondary | ICD-10-CM | POA: Diagnosis not present

## 2023-11-08 DIAGNOSIS — R32 Unspecified urinary incontinence: Secondary | ICD-10-CM | POA: Diagnosis present

## 2023-11-08 DIAGNOSIS — F17210 Nicotine dependence, cigarettes, uncomplicated: Secondary | ICD-10-CM | POA: Insufficient documentation

## 2023-11-08 DIAGNOSIS — N393 Stress incontinence (female) (male): Secondary | ICD-10-CM | POA: Insufficient documentation

## 2023-11-08 DIAGNOSIS — F319 Bipolar disorder, unspecified: Secondary | ICD-10-CM | POA: Insufficient documentation

## 2023-11-08 DIAGNOSIS — R159 Full incontinence of feces: Secondary | ICD-10-CM | POA: Diagnosis present

## 2023-11-08 DIAGNOSIS — E1165 Type 2 diabetes mellitus with hyperglycemia: Secondary | ICD-10-CM | POA: Insufficient documentation

## 2023-11-08 DIAGNOSIS — Z7984 Long term (current) use of oral hypoglycemic drugs: Secondary | ICD-10-CM | POA: Insufficient documentation

## 2023-11-08 DIAGNOSIS — E1169 Type 2 diabetes mellitus with other specified complication: Secondary | ICD-10-CM

## 2023-11-08 MED ORDER — SOLIFENACIN SUCCINATE 5 MG PO TABS
5.0000 mg | ORAL_TABLET | Freq: Every day | ORAL | 1 refills | Status: DC
  Filled 2023-11-08: qty 30, 30d supply, fill #0

## 2023-11-08 NOTE — Progress Notes (Signed)
Subjective:  Patient ID: Candace Howard, female    DOB: 1971/02/17  Age: 52 y.o. MRN: [id number]  CC: Medical Management of Chronic Issues (Urine and fecal incontinence)   HPI Candace Howard is a 52 y.o. year old female patient of Dr. Delford Field with a history of bipolar disorder, type 2 diabetes mellitus, COPD  Interval History: Discussed the use of AI scribe software for clinical note transcription with the patient, who gave verbal consent to proceed.  She presents with urinary and fecal incontinence. She reports that she is not aware of the need to urinate or defecate before it occurs. The urinary incontinence is particularly pronounced when she coughs, and she attributes this to childbirth. She also notes that she urinates frequently due to high consumption of Diet Coke and water.  The fecal incontinence is more concerning to her, as she often finds stool in her underwear without any prior sensation of needing to defecate. She denies any back pain or injury.   Her last A1c was 9.6 in 08/2022 and she is enrolled in the liberate study.  She endorses adherence with her metformin and has an upcoming virtual appointment status. She also reports that she is managing her diabetes with the help of a continuous glucose monitor , and she notes that her blood sugar control has improved.        Past Medical History:  Diagnosis Date   Anemia    Chest pain    Depression    Difficulty breathing    Frequent headaches    IBS (irritable bowel syndrome)    Palpitations    Poor circulation     Past Surgical History:  Procedure Laterality Date   CARPAL TUNNEL RELEASE Bilateral    SKIN GRAFT Bilateral     Family History  Problem Relation Age of Onset   Heart disease Father    Hypertension Father    Diabetes Father    Diabetes Paternal Grandmother    COPD Mother    Glaucoma Mother     Social History   Socioeconomic History   Marital status: Single    Spouse name: Not on file   Number of  children: Not on file   Years of education: Not on file   Highest education level: Not on file  Occupational History   Not on file  Tobacco Use   Smoking status: Every Day    Current packs/day: 0.50    Average packs/day: 0.5 packs/day for 35.0 years (17.5 ttl pk-yrs)    Types: Cigarettes   Smokeless tobacco: Never   Tobacco comments:    Pt states she started back smoking 1 month ago and is smoking 1 ppd. ALS 03/31/23  Vaping Use   Vaping status: Never Used  Substance and Sexual Activity   Alcohol use: Not Currently    Comment: 2 months sober.   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   Sexual activity: Not Currently  Other Topics Concern   Not on file  Social History Narrative   Not on file   Social Determinants of Health   Financial Resource Strain: High Risk (01/11/2022)   Overall Financial Resource Strain (CARDIA)    Difficulty of Paying Living Expenses: Hard  Food Insecurity: Low Risk  (04/28/2023)   [facility]   Hunger Vital Sign    Worried About Running Out of Food in the Last Year: Never true    Ran Out of Food  in the Last Year: Never true  Transportation Needs: Not on file (04/28/2023)  Physical Activity: Inactive (01/11/2022)   Exercise Vital Sign    Days of Exercise per Week: 0 days    Minutes of Exercise per Session: 0 min  Stress: Stress Concern Present (01/11/2022)   [employer] of Occupational Health - Occupational Stress Questionnaire    Feeling of Stress : Very much  Social Connections: Socially Isolated (01/11/2022)   Social Connection and Isolation Panel [NHANES]    Frequency of Communication with Friends and Family: More than three times a week    Frequency of Social Gatherings with Friends and Family: Three times a week    Attends Religious Services: Never    Active Member of Clubs or Organizations: No    Attends Banker Meetings: Never    Marital Status: Never married     Allergies  Allergen Reactions   Hydrocodone     Other reaction(s): Unknown/See Comments "Feel really high"    Outpatient Medications Prior to Visit  Medication Sig Dispense Refill   gabapentin (NEURONTIN) 100 MG capsule Take 1 capsule (100 mg total) by mouth at bedtime. 90 capsule 3   omeprazole (PRILOSEC) 40 MG capsule Take 1 capsule (40 mg total) by mouth at bedtime. 60 capsule 2   risperiDONE (RISPERDAL) 3 MG tablet Take 1 tablet (3 mg total) by mouth daily. 90 tablet 1   traZODone (DESYREL) 150 MG tablet Take 1 tablet (150 mg total) by mouth at bedtime as needed. 90 tablet 1   Accu-Chek Softclix Lancets lancets Use as instructed (Patient not taking: Reported on 11/08/2023) 100 each 12   albuterol (VENTOLIN HFA) 108 (90 Base) MCG/ACT inhaler Inhale 2 puffs into the lungs every 6 (six) hours as needed for wheezing or shortness of breath. (Patient not taking: Reported on 11/08/2023) 18 g 6   Blood Glucose Monitoring Suppl (ACCU-CHEK GUIDE) w/Device KIT use as directed once daily (Patient not taking: Reported on 11/08/2023) 1 kit 0   Blood Glucose Monitoring Suppl (TRUE METRIX METER) w/Device KIT Use to check blood sugar 2 (two) times daily. (Patient not taking: Reported on 11/08/2023) 1 kit 0   budesonide-formoterol (SYMBICORT) 160-4.5 MCG/ACT inhaler Inhale 2 puffs into the lungs 2 (two) times daily. (Patient not taking: Reported on 11/08/2023) 10.2 g 3   Continuous Glucose Sensor (FREESTYLE LIBRE 3 SENSOR) MISC Place 1 sensor on the skin every 14 days. Use to check glucose continuously (Patient not taking: Reported on 11/08/2023) 3 each 12   doxycycline (VIBRAMYCIN) 100 MG capsule Take 1 capsule (100 mg total) by mouth 2 (two) times daily for 10 days. (Patient not taking: Reported on 11/08/2023) 20 capsule 0   fluconazole (DIFLUCAN) 200 MG tablet Take 1 tablet (200 mg total) by mouth daily. (Patient not taking: Reported on 11/08/2023) 1 tablet 1   fluticasone (FLONASE) 50 MCG/ACT nasal  spray Place 1 spray into both nostrils 2 (two) times daily. (Patient not taking: Reported on 11/08/2023) 16 g 3   glucose blood (ACCU-CHEK GUIDE) test strip use  2 (two) times daily as instructed (Patient not taking: Reported on 11/08/2023) 100 each 12   glucose blood (TRUE METRIX BLOOD GLUCOSE TEST) test strip Use as instructed (Patient not taking: Reported on 11/08/2023) 100 each 12   ibuprofen (ADVIL) 800 MG tablet Take 1 tablet (800 mg total) by mouth every 6 (six) hours for mild pain/inflammation. (Patient not taking: Reported on 11/08/2023) 30 tablet 1   ipratropium-albuterol (DUONEB) 0.5-2.5 (3) MG/3ML  SOLN Take 3 mLs by nebulization every 6 (six) hours as needed. (Patient not taking: Reported on 11/08/2023) 120 mL 0   metFORMIN (GLUCOPHAGE-XR) 500 MG 24 hr tablet Take 1 tablet (500 mg total) by mouth 2 (two) times daily with a meal. (Patient not taking: Reported on 11/08/2023) 180 tablet 0   montelukast (SINGULAIR) 10 MG tablet Take 1 tablet (10 mg total) by mouth daily. Needs office visit. (Patient not taking: Reported on 11/08/2023) 60 tablet 2   Tiotropium Bromide Monohydrate (SPIRIVA RESPIMAT) 2.5 MCG/ACT AERS Inhale 2 puffs into the lungs daily. (Patient not taking: Reported on 11/08/2023) 4 g 11   TRUEplus Lancets 28G MISC Use to check blood sugar 2 (two) times daily. (Patient not taking: Reported on 11/08/2023) 100 each 3   No facility-administered medications prior to visit.     ROS Review of Systems  Constitutional:  Negative for activity change and appetite change.  HENT:  Negative for sinus pressure and sore throat.   Respiratory:  Negative for chest tightness, shortness of breath and wheezing.   Cardiovascular:  Negative for chest pain and palpitations.  Gastrointestinal:  Negative for abdominal distention, abdominal pain and constipation.  Genitourinary: Negative.   Musculoskeletal: Negative.   Psychiatric/Behavioral:  Negative for behavioral problems and dysphoric mood.      Objective:  BP 126/79   Pulse 81   Ht 5\' 4"  (1.626 m)   Wt 155 lb 9.6 oz (70.6 kg)   SpO2 98%   BMI 26.71 kg/m      11/08/2023    4:00 PM 11/05/2023    9:09 AM 08/08/2023    3:54 PM  BP/Weight  Systolic BP 126 124 121  Diastolic BP 79 79 81  Wt. (Lbs) 155.6 155 165.4  BMI 26.71 kg/m2 26.61 kg/m2 28.39 kg/m2      Physical Exam Constitutional:      Appearance: She is well-developed.  Cardiovascular:     Rate and Rhythm: Normal rate.     Heart sounds: Normal heart sounds. No murmur heard. Pulmonary:     Effort: Pulmonary effort is normal.     Breath sounds: Normal breath sounds. No wheezing or rales.  Chest:     Chest wall: No tenderness.  Abdominal:     General: Bowel sounds are normal. There is no distension.     Palpations: Abdomen is soft. There is no mass.     Tenderness: There is no abdominal tenderness.  Musculoskeletal:        General: Normal range of motion.     Right lower leg: No edema.     Left lower leg: No edema.  Neurological:     Mental Status: She is alert and oriented to person, place, and time.  Psychiatric:        Mood and Affect: Mood normal.        Latest Ref Rng & Units 02/02/2023   10:01 AM 04/01/2022    9:14 AM 01/28/2022   11:09 AM  CMP  Glucose 70 - 99 mg/dL 213  086    BUN 6 - 24 mg/dL 10  12    Creatinine 5.78 - 1.00 mg/dL 4.69  6.29    Sodium 528 - 144 mmol/L 140  137    Potassium 3.5 - 5.2 mmol/L 4.3  4.1    Chloride 96 - 106 mmol/L 101  100    CO2 20 - 29 mmol/L 17  29    Calcium 8.7 - 10.2 mg/dL 8.8  8.6  Total Protein 6.1 - 8.1 g/dL  6.9    Total Bilirubin 0.2 - 1.2 mg/dL  0.4    AST 10 - 35 U/L  13    ALT 6 - 29 U/L  14  45     Lipid Panel     Component Value Date/Time   CHOL 182 12/27/2021 0640   CHOL 165 12/05/2017 1030   TRIG 114 12/27/2021 0640   HDL 49 12/27/2021 0640   HDL 36 (L) 12/05/2017 1030   CHOLHDL 3.7 12/27/2021 0640   VLDL 23 12/27/2021 0640   LDLCALC 110 (H) 12/27/2021 0640   LDLCALC 87  12/05/2017 1030    CBC    Component Value Date/Time   WBC 9.2 04/27/2022 0949   WBC 8.4 12/25/2021 1030   RBC 5.22 04/27/2022 0949   RBC 4.19 12/25/2021 1030   HGB 16.6 (H) 04/27/2022 0949   HCT 48.7 (H) 04/27/2022 0949   PLT 328 04/27/2022 0949   MCV 93 04/27/2022 0949   MCH 31.8 04/27/2022 0949   MCH 31.5 12/25/2021 1030   MCHC 34.1 04/27/2022 0949   MCHC 32.3 12/25/2021 1030   RDW 12.2 04/27/2022 0949   LYMPHSABS 2.6 04/27/2022 0949   EOSABS 0.0 04/27/2022 0949   BASOSABS 0.0 04/27/2022 0949    Lab Results  Component Value Date   HGBA1C 9.6 (A) 09/08/2023    Assessment & Plan:      Overactive bladder/urinary Incontinence Stress incontinence reported with coughing.  -Prescribe VESIcare. -Recommend Kegel exercises to strengthen pelvic floor muscles.  Fecal Incontinence Unaware of stool passage, no associated pain or back injury.  -Refer to gastroenterology for further evaluation.  Type 2 Diabetes Mellitus Last A1c in September was 9.6, indicating poor control. Patient reports improvement with use of Libre 3 for monitoring. -Order A1c for December 08, 2023. -Schedule follow-up appointment in 6 weeks to review A1c results and for chronic disease management.  General Health Maintenance -Order routine blood work. -Continue monitoring blood glucose levels with Libra 3.          Meds ordered this encounter  Medications   solifenacin (VESICARE) 5 MG tablet    Sig: Take 1 tablet (5 mg total) by mouth daily.    Dispense:  90 tablet    Refill:  1    Follow-up: Return in about 6 weeks (around 12/20/2023) for Chronic medical conditions.       Hoy Register, MD, FAAFP. [facility] [place], [place] [phone]   11/08/2023, 4:56 PM

## 2023-11-08 NOTE — Patient Instructions (Signed)
Kegel Exercises  Kegel exercises can help strengthen your pelvic floor muscles. The pelvic floor is a group of muscles that support your rectum, small intestine, and bladder. In females, pelvic floor muscles also help support the uterus. These muscles help you control the flow of urine and stool (feces). Kegel exercises are painless and simple. They do not require any equipment. Your provider may suggest Kegel exercises to: Improve bladder and bowel control. Improve sexual response. Improve weak pelvic floor muscles after surgery to remove the uterus (hysterectomy) or after pregnancy, in females. Improve weak pelvic floor muscles after prostate gland removal or surgery, in males. Kegel exercises involve squeezing your pelvic floor muscles. These are the same muscles you squeeze when you try to stop the flow of urine or keep from passing gas. The exercises can be done while sitting, standing, or lying down, but it is best to vary your position. Ask your health care provider which exercises are safe for you. Do exercises exactly as told by your health care provider and adjust them as directed. Do not begin these exercises until told by your health care provider. Exercises How to do Kegel exercises: Squeeze your pelvic floor muscles tight. You should feel a tight lift in your rectal area. If you are a female, you should also feel a tightness in your vaginal area. Keep your stomach, buttocks, and legs relaxed. Hold the muscles tight for up to 10 seconds. Breathe normally. Relax your muscles for up to 10 seconds. Repeat as told by your health care provider. Repeat this exercise daily as told by your health care provider. Continue to do this exercise for at least 4-6 weeks, or for as long as told by your health care provider. You may be referred to a physical therapist who can help you learn more about how to do Kegel exercises. Depending on your condition, your health care provider may  recommend: Varying how long you squeeze your muscles. Doing several sets of exercises every day. Doing exercises for several weeks. Making Kegel exercises a part of your regular exercise routine. This information is not intended to replace advice given to you by your health care provider. Make sure you discuss any questions you have with your health care provider. Document Revised: 04/16/2021 Document Reviewed: 04/16/2021 Elsevier Patient Education  2024 [employer]  

## 2023-11-09 LAB — BASIC METABOLIC PANEL
BUN/Creatinine Ratio: 8 — ABNORMAL LOW (ref 9–23)
BUN: 6 mg/dL (ref 6–24)
CO2: 20 mmol/L (ref 20–29)
Calcium: 8.7 mg/dL (ref 8.7–10.2)
Chloride: 96 mmol/L (ref 96–106)
Creatinine, Ser: 0.76 mg/dL (ref 0.57–1.00)
Glucose: 293 mg/dL — ABNORMAL HIGH (ref 70–99)
Potassium: 4.4 mmol/L (ref 3.5–5.2)
Sodium: 133 mmol/L — ABNORMAL LOW (ref 134–144)
eGFR: 95 mL/min/{1.73_m2} (ref >59)

## 2023-11-09 LAB — CBC WITH DIFFERENTIAL/PLATELET
Basophils Absolute: 0 10*3/uL (ref 0.0–0.2)
Basos: 1 %
EOS (ABSOLUTE): 0.1 10*3/uL (ref 0.0–0.4)
Eos: 1 %
Hematocrit: 48.5 % — ABNORMAL HIGH (ref 34.0–46.6)
Hemoglobin: 15.6 g/dL (ref 11.1–15.9)
Immature Grans (Abs): 0 10*3/uL (ref 0.0–0.1)
Immature Granulocytes: 0 %
Lymphocytes Absolute: 2.3 10*3/uL (ref 0.7–3.1)
Lymphs: 36 %
MCH: 29.4 pg (ref 26.6–33.0)
MCHC: 32.2 g/dL (ref 31.5–35.7)
MCV: 91 fL (ref 79–97)
Monocytes Absolute: 0.3 10*3/uL (ref 0.1–0.9)
Monocytes: 5 %
Neutrophils Absolute: 3.7 10*3/uL (ref 1.4–7.0)
Neutrophils: 57 %
Platelets: 272 10*3/uL (ref 150–450)
RBC: 5.31 x10E6/uL — ABNORMAL HIGH (ref 3.77–5.28)
RDW: 11.6 % — ABNORMAL LOW (ref 11.7–15.4)
WBC: 6.5 10*3/uL (ref 3.4–10.8)
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
## 2023-11-10 ENCOUNTER — Ambulatory Visit: Payer: Medicaid Other | Attending: Family Medicine | Admitting: Pharmacist

## 2023-11-10 ENCOUNTER — Other Ambulatory Visit (HOSPITAL_COMMUNITY): Payer: Self-pay

## 2023-11-10 ENCOUNTER — Encounter: Payer: Self-pay | Admitting: Pharmacist

## 2023-11-10 DIAGNOSIS — E1169 Type 2 diabetes mellitus with other specified complication: Secondary | ICD-10-CM

## 2023-11-10 DIAGNOSIS — Z7984 Long term (current) use of oral hypoglycemic drugs: Secondary | ICD-10-CM

## 2023-11-11 LAB — URINE CULTURE

## 2023-11-16 ENCOUNTER — Emergency Department (HOSPITAL_COMMUNITY)
Admission: EM | Admit: 2023-11-16 | Discharge: 2023-11-16 | Disposition: A | Discharge status: Home / Self Care | Payer: MEDICAID | Attending: Emergency Medicine | Admitting: Emergency Medicine

## 2023-11-16 ENCOUNTER — Emergency Department (HOSPITAL_COMMUNITY): Payer: MEDICAID

## 2023-11-16 ENCOUNTER — Encounter (HOSPITAL_COMMUNITY): Payer: Self-pay

## 2023-11-16 ENCOUNTER — Other Ambulatory Visit: Payer: Self-pay

## 2023-11-16 DIAGNOSIS — Z7951 Long term (current) use of inhaled steroids: Secondary | ICD-10-CM | POA: Diagnosis not present

## 2023-11-16 DIAGNOSIS — R0602 Shortness of breath: Secondary | ICD-10-CM | POA: Diagnosis present

## 2023-11-16 DIAGNOSIS — E119 Type 2 diabetes mellitus without complications: Secondary | ICD-10-CM | POA: Insufficient documentation

## 2023-11-16 DIAGNOSIS — J181 Lobar pneumonia, unspecified organism: Secondary | ICD-10-CM | POA: Insufficient documentation

## 2023-11-16 DIAGNOSIS — Z7984 Long term (current) use of oral hypoglycemic drugs: Secondary | ICD-10-CM | POA: Diagnosis not present

## 2023-11-16 DIAGNOSIS — J45909 Unspecified asthma, uncomplicated: Secondary | ICD-10-CM | POA: Insufficient documentation

## 2023-11-16 DIAGNOSIS — F17210 Nicotine dependence, cigarettes, uncomplicated: Secondary | ICD-10-CM | POA: Insufficient documentation

## 2023-11-16 DIAGNOSIS — J189 Pneumonia, unspecified organism: Secondary | ICD-10-CM

## 2023-11-16 DIAGNOSIS — Z20822 Contact with and (suspected) exposure to covid-19: Secondary | ICD-10-CM | POA: Insufficient documentation

## 2023-11-16 DIAGNOSIS — J441 Chronic obstructive pulmonary disease with (acute) exacerbation: Secondary | ICD-10-CM

## 2023-11-16 LAB — RESP PANEL BY RT-PCR (RSV, FLU A&B, COVID)  RVPGX2
Influenza A by PCR: NEGATIVE
Influenza B by PCR: NEGATIVE
Resp Syncytial Virus by PCR: NEGATIVE
SARS Coronavirus 2 by RT PCR: NEGATIVE

## 2023-11-16 MED ORDER — DOXYCYCLINE HYCLATE 100 MG PO TABS
100.0000 mg | ORAL_TABLET | Freq: Once | ORAL | Status: AC
  Administered 2023-11-16: 100 mg via ORAL
  Filled 2023-11-16: qty 1

## 2023-11-16 MED ORDER — IPRATROPIUM-ALBUTEROL 0.5-2.5 (3) MG/3ML IN SOLN
3.0000 mL | Freq: Once | RESPIRATORY_TRACT | Status: AC
  Administered 2023-11-16: 3 mL via RESPIRATORY_TRACT
  Filled 2023-11-16: qty 3

## 2023-11-16 MED ORDER — BENZONATATE 100 MG PO CAPS: 100.0000 mg | ORAL_CAPSULE | Freq: Three times a day (TID) | ORAL | 0 refills | Status: DC

## 2023-11-16 MED ORDER — ALBUTEROL SULFATE HFA 108 (90 BASE) MCG/ACT IN AERS
2.0000 | INHALATION_SPRAY | RESPIRATORY_TRACT | Status: DC | PRN
  Administered 2023-11-16: 2 via RESPIRATORY_TRACT
  Filled 2023-11-16: qty 6.7

## 2023-11-16 MED ORDER — PREDNISONE 20 MG PO TABS
60.0000 mg | ORAL_TABLET | Freq: Every day | ORAL | 0 refills | Status: DC
  Filled 2023-12-20: qty 15, 5d supply, fill #0

## 2023-11-16 MED ORDER — ALBUTEROL SULFATE HFA 108 (90 BASE) MCG/ACT IN AERS: 2.0000 | INHALATION_SPRAY | Freq: Four times a day (QID) | RESPIRATORY_TRACT | Status: AC | PRN

## 2023-11-16 MED ORDER — PREDNISONE 20 MG PO TABS
60.0000 mg | ORAL_TABLET | Freq: Once | ORAL | Status: AC
  Administered 2023-11-16: 60 mg via ORAL
  Filled 2023-11-16: qty 3

## 2023-11-16 MED ORDER — DOXYCYCLINE HYCLATE 100 MG PO CAPS
100.0000 mg | ORAL_CAPSULE | Freq: Two times a day (BID) | ORAL | 0 refills | Status: DC
  Filled 2023-12-20: qty 20, 10d supply, fill #0

## 2023-11-16 NOTE — ED Provider Notes (Signed)
[place] EMERGENCY DEPARTMENT AT [facility] Provider Note   CSN: [id number] Arrival date & time: 11/16/23  0130     History  Chief Complaint  Patient presents with   Shortness of Breath   Cough    Candace Howard is a 52 y.o. female.  The history is provided by the patient.  Shortness of Breath Associated symptoms: cough   Cough Associated symptoms: shortness of breath   She has history of diabetes, asthma and comes in complaining of cough for the last 3 days.  She states it seems to bother her most at night.  She does endorse a funny feeling in her chest but denies any actual chest pain or heaviness or tightness or pressure.  Cough is productive of mainly clear sputum.  She endorses shortness of breath only with coughing spells.  She denies fever, chills, sweats.  She denies nausea or vomiting.  She denies any sick contacts.  She does admit to smoking 2 packs of cigarettes a day.   Home Medications Prior to Admission medications   Medication Sig Start Date End Date Taking? Authorizing Provider  Accu-Chek Softclix Lancets lancets Use as instructed Patient not taking: Reported on 11/08/2023 04/01/22   Mayers, Cari S, PA-C  albuterol (VENTOLIN HFA) 108 (90 Base) MCG/ACT inhaler Inhale 2 puffs into the lungs every 6 (six) hours as needed for wheezing or shortness of breath. Patient not taking: Reported on 11/08/2023 08/08/23   Anders Simmonds, PA-C  Blood Glucose Monitoring Suppl (ACCU-CHEK GUIDE) w/Device KIT use as directed once daily Patient not taking: Reported on 11/08/2023 05/20/23   Georganna Skeans, MD  Blood Glucose Monitoring Suppl (TRUE METRIX METER) w/Device KIT Use to check blood sugar 2 (two) times daily. Patient not taking: Reported on 11/08/2023 08/08/23   Anders Simmonds, PA-C  budesonide-formoterol [facility]) 160-4.5 MCG/ACT inhaler Inhale 2 puffs into the lungs 2 (two) times daily. Patient not taking: Reported on 11/08/2023 08/08/23   Anders Simmonds,  PA-C  Continuous Glucose Sensor (FREESTYLE LIBRE 3 SENSOR) MISC Place 1 sensor on the skin every 14 days. Use to check glucose continuously Patient not taking: Reported on 11/08/2023 11/05/23   Eleonore Chiquito, FNP  doxycycline (VIBRAMYCIN) 100 MG capsule Take 1 capsule (100 mg total) by mouth 2 (two) times daily for 10 days. Patient not taking: Reported on 11/08/2023 04/14/23     fluconazole (DIFLUCAN) 200 MG tablet Take 1 tablet (200 mg total) by mouth daily. Patient not taking: Reported on 11/08/2023 08/08/23   Anders Simmonds, PA-C  fluticasone [facility]) 50 MCG/ACT nasal spray Place 1 spray into both nostrils 2 (two) times daily. Patient not taking: Reported on 11/08/2023 08/08/23   Anders Simmonds, PA-C  gabapentin (NEURONTIN) 100 MG capsule Take 1 capsule (100 mg total) by mouth at bedtime. 11/02/23   Shanna Cisco, NP  glucose blood (ACCU-CHEK GUIDE) test strip use  2 (two) times daily as instructed Patient not taking: Reported on 11/08/2023 05/20/23   Georganna Skeans, MD  glucose blood (TRUE METRIX BLOOD GLUCOSE TEST) test strip Use as instructed Patient not taking: Reported on 11/08/2023 08/08/23   Anders Simmonds, PA-C  ibuprofen (ADVIL) 800 MG tablet Take 1 tablet (800 mg total) by mouth every 6 (six) hours for mild pain/inflammation. Patient not taking: Reported on 11/08/2023 04/18/23     ipratropium-albuterol (DUONEB) 0.5-2.5 (3) MG/3ML SOLN Take 3 mLs by nebulization every 6 (six) hours as needed. Patient not taking: Reported on 11/08/2023 04/27/22  Storm Frisk, MD  metFORMIN (GLUCOPHAGE-XR) 500 MG 24 hr tablet Take 1 tablet (500 mg total) by mouth 2 (two) times daily with a meal. Patient not taking: Reported on 11/08/2023 09/08/23   Storm Frisk, MD  montelukast (SINGULAIR) 10 MG tablet Take 1 tablet (10 mg total) by mouth daily. Needs office visit. Patient not taking: Reported on 11/08/2023 08/08/23   Anders Simmonds, PA-C  omeprazole (PRILOSEC) 40 MG capsule  Take 1 capsule (40 mg total) by mouth at bedtime. 02/02/23   Storm Frisk, MD  risperiDONE (RISPERDAL) 3 MG tablet Take 1 tablet (3 mg total) by mouth daily. 11/02/23   Shanna Cisco, NP  solifenacin (VESICARE) 5 MG tablet Take 1 tablet (5 mg total) by mouth daily. 11/08/23   Hoy Register, MD  Tiotropium Bromide Monohydrate (SPIRIVA RESPIMAT) 2.5 MCG/ACT AERS Inhale 2 puffs into the lungs daily. Patient not taking: Reported on 11/08/2023 03/31/23   Hunsucker, Lesia Sago, MD  traZODone (DESYREL) 150 MG tablet Take 1 tablet (150 mg total) by mouth at bedtime as needed. 11/02/23   Shanna Cisco, NP  TRUEplus Lancets 28G MISC Use to check blood sugar 2 (two) times daily. Patient not taking: Reported on 11/08/2023 08/08/23   Anders Simmonds, PA-C      Allergies    Hydrocodone    Review of Systems   Review of Systems  Respiratory:  Positive for cough and shortness of breath.   All other systems reviewed and are negative.   Physical Exam Updated Vital Signs BP 106/65   Pulse 90   Temp 98.1 F (36.7 C) (Oral)   Resp 18   SpO2 90%  Physical Exam Vitals and nursing note reviewed.   52 year old female, resting comfortably and in no acute distress. Vital signs are normal. Oxygen saturation is 90%, which is borderline. Head is normocephalic and atraumatic. PERRLA, EOMI. Oropharynx is clear. Neck is nontender and supple without adenopathy or JVD. Back is nontender and there is no CVA tenderness. Lungs are clear without rales, wheezes, or rhonchi.  There is a slightly prolonged exhalation phase. Chest is nontender. Heart has regular rate and rhythm without murmur. Abdomen is soft, flat, nontender. Neurologic: Mental status is normal, moves all extremities equally.  ED Results / Procedures / Treatments   Labs (all labs ordered are listed, but only abnormal results are displayed) Labs Reviewed  RESP PANEL BY RT-PCR (RSV, FLU A&B, COVID)  [id number]  RESPIRATORY PANEL BY PCR    Radiology DG Chest 2 View  Result Date: 11/16/2023 CLINICAL DATA:  Cough and shortness of breath. EXAM: CHEST - 2 VIEW COMPARISON:  05/20/2022 FINDINGS: Airspace disease at the left base. No edema, effusion, or pneumothorax. Normal heart size and mediastinal contours. No significant osseous finding. IMPRESSION: Pneumonia at the left base. Electronically Signed   By: Tiburcio Pea M.D.   On: 11/16/2023 04:39    Procedures Procedures    Medications Ordered in ED Medications  doxycycline (VIBRA-TABS) tablet 100 mg (has no administration in time range)  albuterol (VENTOLIN HFA) 108 (90 Base) MCG/ACT inhaler 2 puff (has no administration in time range)  ipratropium-albuterol (DUONEB) 0.5-2.5 (3) MG/3ML nebulizer solution 3 mL (3 mLs Nebulization Given 11/16/23 0310)  predniSONE (DELTASONE) tablet 60 mg (60 mg Oral Given 11/16/23 0309)    ED Course/ Medical Decision Making/ A&P  Medical Decision Making Amount and/or Complexity of Data Reviewed Radiology: ordered.  Risk Prescription drug management.   Cough and dyspnea, likely viral illness, rule out pneumonia.  I have reviewed her laboratory tests, and my interpretation is negative PCR for influenza, COVID-19, RSV.  I have ordered a chest x-ray and I have ordered a nebulizer treatment with albuterol and ipratropium as well as initial dose of prednisone.  I have discussed with the patient that importance of smoking sensation.  She feels significantly better following nebulizer treatment, but does relate ongoing scratchy throat with cough.  Chest x-ray shows left lower lobe pneumonia.  I have independently viewed the images, and agree with radiologist's interpretation.  I have ordered a dose of doxycycline.  I am discharging her with prescriptions for doxycycline, prednisone, benzonatate and I am giving her an albuterol inhaler to take home.  I have counseled her on the need to stop smoking and steps to  take to stop smoking.  I have also requested outpatient social service consult to help patient get established with primary care and get appropriate insurance in place.  Final Clinical Impression(s) / ED Diagnoses Final diagnoses:  Community acquired pneumonia of left lower lobe of lung    Rx / DC Orders ED Discharge Orders          Ordered    predniSONE (DELTASONE) 20 MG tablet  Daily        11/16/23 0453    benzonatate (TESSALON) 100 MG capsule  Every 8 hours        11/16/23 0453    doxycycline (VIBRAMYCIN) 100 MG capsule  2 times daily        11/16/23 0453    albuterol (VENTOLIN HFA) 108 (90 Base) MCG/ACT inhaler  Every 6 hours PRN        11/16/23 0455              Dione Booze, MD 11/16/23 [phone]

## 2023-11-16 NOTE — ED Triage Notes (Signed)
Pt came in for a persistent cough and headache for 3 days usually after falling asleep. Pt will also wake up jerking as well.

## 2023-11-17 ENCOUNTER — Telehealth: Payer: Self-pay

## 2023-11-17 NOTE — Telephone Encounter (Signed)
Consult from last evening, patient needed medication assistance. She does not have insurance and cannot pay for her medication. [facility], [facility] [address] [address] [facility] pulled [facility] over.  Patient aware that the [facility] [address]. MATCH MEDICATION ASSISTANCE CARD Pharmacies please call: [phone] for claim processing assistance.  Rx BIN: [id number] Rx Group: [id number] Rx PCN: [id number] Relationship Code: 2 Person Code: [id number]  Patient ID (MRN): [name]   [id number]       Patient Name:  Candace Howard    Patient DOB: 30-Jan-1971    Discharge Date: 11/17/2023    Expiration Date: 11/25/2023 (must be filled within 7 days of discharge)   Dear  [name], You have been approved to have the prescriptions written by your discharging physician filled through our [facility] (Medication Assistance Through [facility]) program. This program allows for a one-time (no refills) 34-day supply of selected medications for a low copay amount.  The copay is $3.00 per prescription. For instance, if you have one prescription, you will pay $3.00; for two prescriptions, you pay $6.00; for three prescriptions, you pay $9.00; and so on. Only certain pharmacies are participating in this program with [facility]. You will need to select one of the pharmacies from the attached lists and take your prescriptions, this letter, and your photo ID to one of the participating pharmacies.  We are excited that you are able to use the [facility] program to get your medications. These prescriptions must be filled within 7 days of hospital discharge or they will no longer be valid for the [facility] program. Should you have any problems with your prescriptions please contact your case management team member at [phone] for [place] or [phone] for [facility].  Thank you, [facility]  [facility] Program Pharmacies [place]. [facility]  [facility] (continue [id number] [place]

## 2023-11-18 ENCOUNTER — Other Ambulatory Visit: Payer: Self-pay

## 2023-12-01 ENCOUNTER — Ambulatory Visit: Payer: Medicaid Other | Attending: Family Medicine | Admitting: Pharmacist

## 2023-12-01 DIAGNOSIS — E1169 Type 2 diabetes mellitus with other specified complication: Secondary | ICD-10-CM

## 2023-12-01 DIAGNOSIS — Z7984 Long term (current) use of oral hypoglycemic drugs: Secondary | ICD-10-CM

## 2023-12-01 LAB — POCT GLYCOSYLATED HEMOGLOBIN (HGB A1C): HbA1c, POC (controlled diabetic range): 8.5 % — AB (ref 0.0–7.0)
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
## 2023-12-15 ENCOUNTER — Other Ambulatory Visit (HOSPITAL_COMMUNITY): Payer: Self-pay

## 2023-12-15 ENCOUNTER — Other Ambulatory Visit (HOSPITAL_BASED_OUTPATIENT_CLINIC_OR_DEPARTMENT_OTHER): Payer: Self-pay

## 2023-12-15 ENCOUNTER — Other Ambulatory Visit: Payer: Self-pay

## 2023-12-16 ENCOUNTER — Other Ambulatory Visit: Payer: Self-pay

## 2023-12-20 ENCOUNTER — Other Ambulatory Visit (HOSPITAL_COMMUNITY): Payer: Self-pay

## 2023-12-26 ENCOUNTER — Ambulatory Visit: Payer: 59 | Attending: Family Medicine | Admitting: Family Medicine

## 2024-01-03 ENCOUNTER — Ambulatory Visit: Payer: 59 | Attending: Family Medicine | Admitting: Pharmacist

## 2024-01-03 ENCOUNTER — Encounter: Payer: Self-pay | Admitting: Pharmacist

## 2024-01-03 DIAGNOSIS — E119 Type 2 diabetes mellitus without complications: Secondary | ICD-10-CM

## 2024-01-03 DIAGNOSIS — Z7984 Long term (current) use of oral hypoglycemic drugs: Secondary | ICD-10-CM
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
## 2024-01-04 ENCOUNTER — Other Ambulatory Visit (HOSPITAL_COMMUNITY): Payer: Self-pay

## 2024-01-16 ENCOUNTER — Other Ambulatory Visit (HOSPITAL_COMMUNITY): Payer: Self-pay

## 2024-01-17 ENCOUNTER — Other Ambulatory Visit (HOSPITAL_COMMUNITY): Payer: Self-pay

## 2024-01-17 ENCOUNTER — Other Ambulatory Visit: Payer: Self-pay

## 2024-01-18 ENCOUNTER — Encounter (HOSPITAL_COMMUNITY): Payer: Self-pay | Admitting: Psychiatry

## 2024-01-18 ENCOUNTER — Ambulatory Visit (INDEPENDENT_AMBULATORY_CARE_PROVIDER_SITE_OTHER): Payer: 59 | Admitting: Family Medicine

## 2024-01-18 ENCOUNTER — Other Ambulatory Visit (HOSPITAL_COMMUNITY): Payer: Self-pay

## 2024-01-18 ENCOUNTER — Other Ambulatory Visit (HOSPITAL_BASED_OUTPATIENT_CLINIC_OR_DEPARTMENT_OTHER): Payer: Self-pay

## 2024-01-18 ENCOUNTER — Encounter: Payer: Self-pay | Admitting: Family Medicine

## 2024-01-18 ENCOUNTER — Ambulatory Visit: Payer: Self-pay | Admitting: Family Medicine

## 2024-01-18 ENCOUNTER — Telehealth (HOSPITAL_COMMUNITY): Payer: MEDICAID | Admitting: Psychiatry

## 2024-01-18 VITALS — BP 130/80 | HR 92 | Temp 98.0°F | Resp 16 | Ht 64.0 in | Wt 146.6 lb

## 2024-01-18 DIAGNOSIS — F3160 Bipolar disorder, current episode mixed, unspecified: Secondary | ICD-10-CM

## 2024-01-18 DIAGNOSIS — J029 Acute pharyngitis, unspecified: Secondary | ICD-10-CM | POA: Diagnosis not present

## 2024-01-18 DIAGNOSIS — F411 Generalized anxiety disorder: Secondary | ICD-10-CM

## 2024-01-18 LAB — POC INFLUENZA A&B (BINAX/QUICKVUE)
Influenza A, POC: NEGATIVE
Influenza B, POC: NEGATIVE

## 2024-01-18 LAB — POC COVID19 BINAXNOW: SARS Coronavirus 2 Ag: NEGATIVE

## 2024-01-18 MED ORDER — RISPERIDONE 3 MG PO TABS
3.0000 mg | ORAL_TABLET | Freq: Every day | ORAL | 1 refills | Status: DC
  Filled 2024-01-18: qty 90, 90d supply, fill #0
  Filled 2024-03-15: qty 30, 30d supply, fill #0

## 2024-01-18 MED ORDER — TRAZODONE HCL 150 MG PO TABS
150.0000 mg | ORAL_TABLET | Freq: Every evening | ORAL | 1 refills | Status: DC | PRN
  Filled 2024-01-18: qty 90, 90d supply, fill #0
  Filled 2024-02-29: qty 30, 30d supply, fill #0

## 2024-01-18 MED ORDER — NYSTATIN 100000 UNIT/ML MT SUSP
5.0000 mL | Freq: Four times a day (QID) | OROMUCOSAL | 0 refills | Status: AC
  Filled 2024-01-18: qty 120, 6d supply, fill #0

## 2024-01-18 MED ORDER — GABAPENTIN 100 MG PO CAPS
100.0000 mg | ORAL_CAPSULE | Freq: Every day | ORAL | 3 refills | Status: DC
Start: 2024-01-18 — End: 2024-04-11
  Filled 2024-01-18: qty 90, 90d supply, fill #0
  Filled 2024-02-29: qty 30, 30d supply, fill #0
  Filled 2024-04-02: qty 30, 30d supply, fill #1

## 2024-01-18 NOTE — Progress Notes (Signed)
BH MD/PA/NP OP Progress Note Virtual Visit via Video Note  I connected with Candace Howard on 01/18/24 at 10:00 AM EST by a video enabled telemedicine application and verified that I am speaking with the correct person using two identifiers.  Location: Patient: Work Provider: Clinic   I discussed the limitations of evaluation and management by telemedicine and the availability of in person appointments. The patient expressed understanding and agreed to proceed.  I provided 30 minutes of non-face-to-face time during this encounter.   01/18/2024 10:14 AM Candace Howard  MRN:  [id number]  Chief Complaint: "I have been sick"  HPI: 53 year old female seen today for follow-up psychiatric evaluation.  She has a psychiatric history of substance use (Alcohol, Tobacco, meth, and cocaine in remission) Bipolar affective disorder, Depression, anxiety, and PTSD.  Currently she is being managed on Risperdal 3 mg nightly, gabapentin 100 mg 3 times daily (reports that she became addicted to higher doses), and trazodone 150 mg nightly as needed.  Today she reports her medications are effective in managing her psychiatric condition.  Today she was well-groomed, pleasant, cooperative, and engaged in conversation.  Patient informed Clinical research associate that she has been sick with a cough for the last week.  She notes that she will go to see her primary care doctor today to address these concerns.  Patient notes that she has also had a sore throat.  She quantifies her throat pain as 6 out of 10.  To alleviate her pain she utilize Motrin and coffee.    Since her last visit she inform her that her anxiety and depression are well-managed. Today provider conducted a GAD 7 and patient scored a 6,  at her last visit she scored an 6.  Provider also conducted PHQ-9 and patient scored a 1, at her last visit she scored a 10.  She endorses adequate sleep and appetite.  Today she denies SI/HI/VAH or paranoia.    No medication changes made  today.  Patient agreeable to continue medication as scribed.  Patient has not had routine psychiatric labs in a while.  Today provider reordered prolactin level, thyroid panel, LFT, lipid panel, UDS, HgbA1c, CBC, and CMP.  She notes that she will try to have them drawn.  No other concerns at this time. Visit Diagnosis:    ICD-10-CM   1. Anxiety state  F41.1 gabapentin (NEURONTIN) 100 MG capsule    traZODone (DESYREL) 150 MG tablet    risperiDONE (RISPERDAL) 3 MG tablet    2. Bipolar affective disorder, current episode mixed, without psychotic features (HCC)  F31.60 gabapentin (NEURONTIN) 100 MG capsule    traZODone (DESYREL) 150 MG tablet    risperiDONE (RISPERDAL) 3 MG tablet    Prolactin    Thyroid Panel With TSH    Hepatic function panel    Lipid Profile    Rapid urine drug screen (hospital performed)    HgB A1c    Comprehensive Metabolic Panel (CMET)    Comprehensive Metabolic Panel (CMET)    HgB A1c    Rapid urine drug screen (hospital performed)    Lipid Profile    Hepatic function panel    Thyroid Panel With TSH    Prolactin         Past Psychiatric History: substance use (Alcohol, Tobacco, meth, and cocaine) Bipolar affective disorder, Depression, anxiety, and PTSD  Past Medical History:  Past Medical History:  Diagnosis Date   Anemia    Chest pain    Depression    Difficulty breathing  Frequent headaches    IBS (irritable bowel syndrome)    Palpitations    Poor circulation     Past Surgical History:  Procedure Laterality Date   CARPAL TUNNEL RELEASE Bilateral    SKIN GRAFT Bilateral     Family Psychiatric History:  Son Substance use, maternal 4 uncles SI, mother abuse of pill use   Family History:  Family History  Problem Relation Age of Onset   Heart disease Father    Hypertension Father    Diabetes Father    Diabetes Paternal Grandmother    COPD Mother    Glaucoma Mother     Social History:  Social History   Socioeconomic History    Marital status: Single    Spouse name: Not on file   Number of children: Not on file   Years of education: Not on file   Highest education level: Not on file  Occupational History   Not on file  Tobacco Use   Smoking status: Every Day    Current packs/day: 0.50    Average packs/day: 0.5 packs/day for 35.0 years (17.5 ttl pk-yrs)    Types: Cigarettes   Smokeless tobacco: Never   Tobacco comments:    Pt states she started back smoking 1 month ago and is smoking 1 ppd. ALS 03/31/23  Vaping Use   Vaping status: Never Used  Substance and Sexual Activity   Alcohol use: Not Currently    Comment: 2 months sober.   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   Sexual activity: Not Currently  Other Topics Concern   Not on file  Social History Narrative   Not on file   Social Drivers of Health   Financial Resource Strain: High Risk (01/11/2022)   Overall Financial Resource Strain (CARDIA)    Difficulty of Paying Living Expenses: Hard  Food Insecurity: Low Risk  (04/28/2023)   [facility]   Hunger Vital Sign    Worried About Running Out of Food in the Last Year: Never true    Ran Out of Food in the Last Year: Never true  Transportation Needs: Not on file (04/28/2023)  Physical Activity: Inactive (01/11/2022)   Exercise Vital Sign    Days of Exercise per Week: 0 days    Minutes of Exercise per Session: 0 min  Stress: Stress Concern Present (01/11/2022)   [employer] of Occupational Health - Occupational Stress Questionnaire    Feeling of Stress : Very much  Social Connections: Socially Isolated (01/11/2022)   Social Connection and Isolation Panel [NHANES]    Frequency of Communication with Friends and Family: More than three times a week    Frequency of Social Gatherings with Friends and Family: Three times a week    Attends Religious Services: Never    Active Member of Clubs or Organizations: No    Attends Tax inspector Meetings: Never    Marital Status: Never married    Allergies:  Allergies  Allergen Reactions   Hydrocodone     Other reaction(s): Unknown/See Comments "Feel really high"    Metabolic Disorder Labs: Lab Results  Component Value Date   HGBA1C 8.5 (A) 12/01/2023   MPG 108.28 12/25/2021   No results found for: "PROLACTIN" Lab Results  Component Value Date   CHOL 182 12/27/2021   TRIG 114 12/27/2021   HDL 49 12/27/2021   CHOLHDL 3.7 12/27/2021   VLDL 23 12/27/2021   LDLCALC 110 (  H) 12/27/2021   LDLCALC 130 (H) 12/25/2021   Lab Results  Component Value Date   TSH 0.636 12/25/2021    Therapeutic Level Labs: No results found for: "LITHIUM" No results found for: "VALPROATE" No results found for: "CBMZ"  Current Medications: Current Outpatient Medications  Medication Sig Dispense Refill   Accu-Chek Softclix Lancets lancets Use as instructed (Patient not taking: Reported on 11/08/2023) 100 each 12   albuterol (VENTOLIN HFA) 108 (90 Base) MCG/ACT inhaler Inhale 2 puffs into the lungs every 6 (six) hours as needed for wheezing or shortness of breath.     benzonatate (TESSALON) 100 MG capsule Take 1 capsule (100 mg total) by mouth every 8 (eight) hours. 21 capsule 0   Blood Glucose Monitoring Suppl (ACCU-CHEK GUIDE) w/Device KIT use as directed once daily (Patient not taking: Reported on 11/08/2023) 1 kit 0   Blood Glucose Monitoring Suppl (TRUE METRIX METER) w/Device KIT Use to check blood sugar 2 (two) times daily. (Patient not taking: Reported on 11/08/2023) 1 kit 0   budesonide-formoterol (SYMBICORT) 160-4.5 MCG/ACT inhaler Inhale 2 puffs into the lungs 2 (two) times daily. (Patient not taking: Reported on 11/08/2023) 10.2 g 3   Continuous Glucose Sensor (FREESTYLE LIBRE 3 SENSOR) MISC Place 1 sensor on the skin every 14 days. Use to check glucose continuously (Patient not taking: Reported on 11/08/2023) 3 each 12   doxycycline (VIBRAMYCIN) 100 MG capsule Take 1  capsule (100 mg total) by mouth 2 (two) times daily for 10 days. 20 capsule 0   fluticasone (FLONASE) 50 MCG/ACT nasal spray Place 1 spray into both nostrils 2 (two) times daily. (Patient not taking: Reported on 11/08/2023) 16 g 3   gabapentin (NEURONTIN) 100 MG capsule Take 1 capsule (100 mg total) by mouth at bedtime. 90 capsule 3   glucose blood (ACCU-CHEK GUIDE) test strip use  2 (two) times daily as instructed (Patient not taking: Reported on 11/08/2023) 100 each 12   glucose blood (TRUE METRIX BLOOD GLUCOSE TEST) test strip Use as instructed (Patient not taking: Reported on 11/08/2023) 100 each 12   ibuprofen (ADVIL) 800 MG tablet Take 1 tablet (800 mg total) by mouth every 6 (six) hours for mild pain/inflammation. (Patient not taking: Reported on 11/08/2023) 30 tablet 1   ipratropium-albuterol (DUONEB) 0.5-2.5 (3) MG/3ML SOLN Take 3 mLs by nebulization every 6 (six) hours as needed. (Patient not taking: Reported on 11/08/2023) 120 mL 0   metFORMIN (GLUCOPHAGE-XR) 500 MG 24 hr tablet Take 1 tablet (500 mg total) by mouth 2 (two) times daily with a meal. (Patient not taking: Reported on 11/08/2023) 180 tablet 0   montelukast (SINGULAIR) 10 MG tablet Take 1 tablet (10 mg total) by mouth daily. Needs office visit. (Patient not taking: Reported on 11/08/2023) 60 tablet 2   omeprazole (PRILOSEC) 40 MG capsule Take 1 capsule (40 mg total) by mouth at bedtime. 60 capsule 2   predniSONE (DELTASONE) 20 MG tablet Take 3 tablets (60 mg total) by mouth daily. 15 tablet 0   risperiDONE (RISPERDAL) 3 MG tablet Take 1 tablet (3 mg total) by mouth daily. 90 tablet 1   solifenacin (VESICARE) 5 MG tablet Take 1 tablet (5 mg total) by mouth daily. 90 tablet 1   Tiotropium Bromide Monohydrate (SPIRIVA RESPIMAT) 2.5 MCG/ACT AERS Inhale 2 puffs into the lungs daily. (Patient not taking: Reported on 11/08/2023) 4 g 11   traZODone (DESYREL) 150 MG tablet Take 1 tablet (150 mg total) by mouth at bedtime as needed. 90  tablet 1   TRUEplus Lancets 28G MISC Use to check blood sugar 2 (two) times daily. (Patient not taking: Reported on 11/08/2023) 100 each 3   No current facility-administered medications for this visit.     Musculoskeletal: Strength & Muscle Tone: within normal limits telehealth visit Gait & Station: normal telehealth visit Patient leans: N/A  Psychiatric Specialty Exam: Review of Systems  There were no vitals taken for this visit.There is no height or weight on file to calculate BMI.  General Appearance: Well Groomed  Eye Contact:  Good  Speech:  Clear and Coherent and Normal Rate  Volume:  Normal  Mood:  Euthymic  Affect:  Appropriate and Congruent  Thought Process:  Coherent, Goal Directed, and Linear  Orientation:  Full (Time, Place, and Person)  Thought Content: WDL   Suicidal Thoughts:  No  Homicidal Thoughts:  No  Memory:  Immediate;   Good Recent;   Good Remote;   Good  Judgement:  Good  Insight:  Good  Psychomotor Activity:  Normal  Concentration:  Concentration: Good and Attention Span: Good  Recall:  Good  Fund of Knowledge: Good  Language: Good  Akathisia:  No  Handed:  Left  AIMS (if indicated): not done  Assets:  Communication Skills Desire for Improvement Financial Resources/Insurance Housing Physical Health Social Support  ADL's:  Intact  Cognition: WNL  Sleep:  Good   Screenings: AIMS    Flowsheet Row Clinical Support [facility]11[facility]2022 [facility]) [facility]  AIMS Total Score 0 0      AUDIT    Flowsheet Row Ad[facility] (Dis[facility]  Alcohol Use Disorder Identification Test Final Score (AUDIT) 0      GAD-7    Flowsheet Row Video Visit from 01/18/2024 in [facility] Video Visit from 11/02/2023 in [facility] Video Visit  from 08/10/2023 in [facility] Office Visit from 08/08/2023 in [facility] [place] - A Dept Of [place]. [facility] Video Visit from 05/18/2023 in [facility]  Total GAD-7 Score 6 6 5 2 4       PHQ2-9    Flowsheet Row Video Visit from 01/18/2024 in [facility] Video Visit from 11/02/2023 in [facility] Video Visit from 08/10/2023 in [facility] Office Visit from 08/08/2023 in [facility] [place] - A Dept Of [place]. [facility] Video Visit from 05/18/2023 in [facility]  PHQ-2 Total Score 0 2 0 1 3  PHQ-9 Total Score 1 10 6  -- 9      Flowsheet Row ED from 11/16/2023 in [facility] at [facility] Office Visit from 02/28/2023 in [facility] Counselor from 01/11/2022 in [facility]  C-SSRS RISK CATEGORY No Risk Low Risk High Risk        Assessment and Plan: Patient informed writer that her mood, anxiety, and depression are well-managed.  She does endorse throat pain and a cough.  She will follow-up with her PCP today.No medication changes made today.  Patient agreeable to continue medication as scribed.  Patient has not had routine psychiatric labs in a while.  Today provider reordered prolactin level, thyroid panel, LFT, lipid panel, UDS, HgbA1c, CBC, and CMP.  She notes that she will try to have them drawn.  1. Anxiety state  Continue- gabapentin (NEURONTIN) 100 MG capsule; Take 1 capsule (100 mg total) by mouth at bedtime.  Dispense: 90 capsule; Refill: 3 Continue- risperiDONE (RISPERDAL) 3 MG tablet; Take 1 tablet (3 mg total) by mouth daily.  Dispense: 90 tablet; Refill: 1 Continue- traZODone (DESYREL) 150 MG tablet; Take 1 tablet (150 mg total) by mouth at bedtime as needed.  Dispense: 90 tablet;  Refill: 1  2. Bipolar affective disorder, current episode mixed, without psychotic features (HCC)  - Prolactin - Thyroid Panel With TSH - Hepatic function panel - Lipid Profile - POCT Urine Drug Screen - HgB A1c - CBC w/Diff/Platelet - Comprehensive Metabolic Panel (CMET) Continue- gabapentin (NEURONTIN) 100 MG capsule; Take 1 capsule (100 mg total) by mouth at bedtime.  Dispense: 90 capsule; Refill: 3 Continue- risperiDONE (RISPERDAL) 3 MG tablet; Take 1 tablet (3 mg total) by mouth daily.  Dispense: 90 tablet; Refill: 1 Continue- traZODone (DESYREL) 150 MG tablet; Take 1 tablet (150 mg total) by mouth at bedtime as needed.  Dispense: 90 tablet; Refill: 1  Collaboration of Care: Collaboration of Care: Other provider involved in patient's care AEB PCP  Patient/Guardian was advised Release of Information must be obtained prior to any record release in order to collaborate their care with an outside provider. Patient/Guardian was advised if they have not already done so to contact the registration department to sign all necessary forms in order for [place] to release information regarding their care.   Consent: Patient/Guardian gives verbal consent for treatment and assignment of benefits for services provided during this visit. Patient/Guardian expressed understanding and agreed to proceed.   Follow-up in 2.5 months Shanna Cisco, NP 01/18/2024, 10:14 AM

## 2024-01-18 NOTE — Telephone Encounter (Signed)
  Chief Complaint: sore throat  Symptoms: sore throat, runny nose, cough, L earache, itchy eyes Frequency: 2 days ago Pertinent Negatives: Patient denies fever, chest pain, severe difficulty breathing,  Disposition: [] ED /[] Urgent Care (no appt availability in office) / [x] Appointment(In office/virtual)/ []  [facility]/ [] Home Care/ [] Refused Recommended Disposition /[] [place] [facility]/ []  Follow-up with PCP Additional Notes: Patient c/o reoccurring sore throat x 2 days. Patient denies fever, severe difficulty breathing, chest pain. Endorses cough, runny nose, earache. Has taken OTC with minimal relief and feels that sore throat is worsening, especially at night. Of note, reports has a hx of throat polyps. Scheduled patient per protocol on 01/16/24 at [facility]. Patient verbalized understanding and to call back with worsening symptoms.     Copied from CRM [id number]. Topic: Clinical - Red Word Triage >> Jan 18, 2024  8:57 AM Dimitri Ped wrote: [facility] that prompted transfer to Nurse Triage: patient is calling having severe pain of a sore throat . Wants to speak with a nurse Reason for Disposition  Earache also present  Answer Assessment - Initial Assessment Questions 1. ONSET: "When did the throat start hurting?" (Hours or days ago)      A week ago - resolved then came back 2 days ago 2. SEVERITY: "How bad is the sore throat?" (Scale 1-10; mild, moderate or severe)   - MILD (1-3):  Doesn't interfere with eating or normal activities.   - MODERATE (4-7): Interferes with eating some solids and normal activities.   - SEVERE (8-10):  Excruciating pain, interferes with most normal activities.   - SEVERE WITH DYSPHAGIA (10): Can't swallow liquids, drooling.     Taking motrin with + relief Moderate - hard to swallow 3. STREP EXPOSURE: "Has there been any exposure to strep within the past week?" If Yes, ask: "What type of contact occurred?"      no 4.  VIRAL SYMPTOMS: "Are there any  symptoms of a cold, such as a runny nose, cough, hoarse voice or red eyes?"      Runny nose Itchy eyes Cough L ear pain 5. FEVER: "Do you have a fever?" If Yes, ask: "What is your temperature, how was it measured, and when did it start?"     no 6. PUS ON THE TONSILS: "Is there pus on the tonsils in the back of your throat?"     I can't see 7. OTHER SYMPTOMS: "Do you have any other symptoms?" (e.g., difficulty breathing, headache, rash)     No, SOB but that's because I smoke  Protocols used: Sore Throat-A-AH

## 2024-01-18 NOTE — Patient Instructions (Signed)
Push fluids.  OK to take Tylenol 1000 mg (2 extra strength tabs) or 975 mg (3 regular strength tabs) every 6 hours as needed.  Ibuprofen 400-600 mg (2-3 over the counter strength tabs) every 6 hours as needed for pain.  If no better by Sunday/Monday, please seek care.  Let us know if you need anything.

## 2024-01-18 NOTE — Progress Notes (Signed)
Chief Complaint  Patient presents with   Sore Throat    Sore throat    Hildred Priest here for URI complaints.  Duration: 3 days; she was sick last week going into this time frame Associated symptoms: rhinorrhea, ear pain, sore throat, and pain over back of tongue Denies: sinus congestion, sinus pain, itchy watery eyes, ear fullness, ear drainage, wheezing, shortness of breath, myalgia, food/beverages getting stuck, spitting up blood, and fevers, cough out of ordinary Treatment to date: Motrin Sick contacts: No  Past Medical History:  Diagnosis Date   Anemia    Chest pain    Depression    Difficulty breathing    Frequent headaches    IBS (irritable bowel syndrome)    Palpitations    Poor circulation     Objective BP 130/80   Pulse 92   Temp 98 F (36.7 C) (Oral)   Resp 16   Ht 5\' 4"  (1.626 m)   Wt 146 lb 9.6 oz (66.5 kg)   SpO2 98%   BMI 25.16 kg/m  General: Awake, alert, appears stated age HEENT: AT, [place], ears patent b/l and TM's neg, nares patent w/o discharge, pharynx hyperemic, without exudates, MMM, no obvious tongue lesions, no sinus ttp Neck: No masses or asymmetry Heart: RRR Lungs: CTAB, no accessory muscle use Psych: Age appropriate judgment and insight, normal mood and affect  Sore throat - Plan: nystatin (MYCOSTATIN) 100000 UNIT/ML suspension  Strep and flu neg. No other assoc s/s's. Suspect thrush, will tx w Nystatin. If no better in the next 3-4 d, will f/u w reg PCP. Continue to push fluids, practice good hand hygiene, cover mouth when coughing. F/u prn. If starting to experience fevers, shaking, or shortness of breath, seek immediate care. Pt voiced understanding and agreement to the plan.  Jilda Roche Washam, DO 01/18/24 1:01 PM

## 2024-01-18 NOTE — Addendum Note (Signed)
Addended by: Roxanne Gates on: 01/18/2024 01:14 PM   Modules accepted: Orders
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
## 2024-01-24 ENCOUNTER — Ambulatory Visit: Payer: 59 | Attending: Family Medicine | Admitting: Pharmacist

## 2024-01-27 ENCOUNTER — Encounter: Payer: Self-pay | Admitting: Internal Medicine

## 2024-01-31 DIAGNOSIS — K219 Gastro-esophageal reflux disease without esophagitis: Secondary | ICD-10-CM | POA: Diagnosis not present

## 2024-01-31 DIAGNOSIS — J438 Other emphysema: Secondary | ICD-10-CM | POA: Diagnosis not present

## 2024-01-31 DIAGNOSIS — F17210 Nicotine dependence, cigarettes, uncomplicated: Secondary | ICD-10-CM | POA: Diagnosis not present

## 2024-01-31 DIAGNOSIS — J381 Polyp of vocal cord and larynx: Secondary | ICD-10-CM | POA: Diagnosis not present

## 2024-02-01 ENCOUNTER — Encounter: Payer: Self-pay | Admitting: Internal Medicine

## 2024-02-01 ENCOUNTER — Ambulatory Visit (INDEPENDENT_AMBULATORY_CARE_PROVIDER_SITE_OTHER): Payer: 59 | Admitting: Internal Medicine

## 2024-02-01 VITALS — BP 96/62 | HR 91 | Ht 64.0 in | Wt 143.1 lb

## 2024-02-01 DIAGNOSIS — R194 Change in bowel habit: Secondary | ICD-10-CM | POA: Diagnosis not present

## 2024-02-01 DIAGNOSIS — R1011 Right upper quadrant pain: Secondary | ICD-10-CM | POA: Diagnosis not present

## 2024-02-01 DIAGNOSIS — R634 Abnormal weight loss: Secondary | ICD-10-CM

## 2024-02-01 DIAGNOSIS — R159 Full incontinence of feces: Secondary | ICD-10-CM | POA: Diagnosis not present

## 2024-02-01 DIAGNOSIS — K8689 Other specified diseases of pancreas: Secondary | ICD-10-CM

## 2024-02-01 DIAGNOSIS — K6289 Other specified diseases of anus and rectum: Secondary | ICD-10-CM | POA: Diagnosis not present

## 2024-02-01 DIAGNOSIS — Z8719 Personal history of other diseases of the digestive system: Secondary | ICD-10-CM | POA: Diagnosis not present

## 2024-02-01 MED ORDER — SUFLAVE 178.7 G PO SOLR: 1.0000 | Freq: Once | ORAL | 0 refills | Status: AC

## 2024-02-01 NOTE — Patient Instructions (Addendum)
You have been scheduled for a CT scan of the abdomen and pelvis at [facility], 1st floor Radiology. You are scheduled on 02/06/24 at 3 pm. You should arrive 15 minutes prior to your appointment time for registration.   You may take any medications as prescribed with a small amount of water, if necessary. If you take any of the following medications: METFORMIN, GLUCOPHAGE, GLUCOVANCE, AVANDAMET, RIOMET, FORTAMET, ACTOPLUS MET, JANUMET, GLUMETZA or METAGLIP, you MAY be asked to HOLD this medication 48 hours AFTER the exam.   The purpose of you drinking the oral contrast is to aid in the visualization of your intestinal tract. The contrast solution may cause some diarrhea. Depending on your individual set of symptoms, you may also receive an intravenous injection of x-ray contrast/dye. Plan on being at [facility] for 45 minutes or longer, depending on the type of exam you are having performed.   If you have any questions regarding your exam or if you need to reschedule, you may call [facility] at [phone] between the hours of 8:00 am and 5:00 pm, Monday-Friday.    You have been scheduled for a colonoscopy. Please follow written instructions given to you at your visit today.   If you use inhalers (even only as needed), please bring them with you on the day of your procedure.  DO NOT TAKE 7 DAYS PRIOR TO TEST- Trulicity (dulaglutide) Ozempic, Wegovy (semaglutide) Mounjaro (tirzepatide) Bydureon Bcise (exanatide extended release)  DO NOT TAKE 1 DAY PRIOR TO YOUR TEST Rybelsus (semaglutide) Adlyxin (lixisenatide) Victoza (liraglutide) Byetta (exanatide) ___________________________________________________________________________  Bonita Quin will receive your bowel preparation through [facility], which ensures the lowest copay and home delivery, with outreach via text or call from an [phone] number. Please respond promptly to avoid rescheduling of your procedure. If you are interested in  alternative options or have any questions regarding your prep, please contact them at [phone] ____________________________________________________________________________  Your Provider Has Sent Your Bowel Prep Regimen To [facility]   [facility] will contact you to verify your information and collect your copay, if applicable. Enjoy the comfort of your home while your prescription is mailed to you, FREE of any shipping charges.   [facility] accepts all major insurance benefits and applies discounts & coupons.  Have additional questions?   Chat: [email or web address] Call: [phone] Email: [email or web address] NCPDP: [id number]  How will [facility] contact you?  With a Welcome phone call,  a Welcome text and a checkout link in text form.  Texts you receive from [phone] Are NOT Spam.  *To set up delivery, you must complete the checkout process via link or speak to one of the patient care representatives. If [facility] is unable to reach you, your prescription may be delayed.  To avoid long hold times on the phone, you may also utilize the secure chat feature on the [facility] website to request that they call you back for transaction completion or to expedite your concerns.  You will receive your bowel preparation through [facility], which ensures the lowest copay and home delivery, with outreach via text or call from an [phone] number. Please respond promptly to avoid rescheduling. If you are interested in alternative options or have any questions please contact them at [phone]  Your Provider Has Sent Your Bowel Prep Regimen To [facility] What to expect. [facility] will contact you to verify your information and collect your copay, if applicable. Enjoy the comfort of your home while we deliver your prescription to you, free of any shipping charges. Fast,  FREE delivery or shipping. [facility] accepts all major insurance benefits and applies discounts & coupons  Have  additional questions? [facility]'s patient care team is always here to help.  Chat: [email or web address] Call: [phone] Email: [email or web address] NCPDP: [id number] How will we contact you? Welcome Phone call  a Welcome text and a Checkout link in a text Texts you receive from [phone] Are Not Spam.   *To set up delivery, you must complete the checkout process via link or speak to one of our patient care representatives. If we are unable to reach you, your prescription may be delayed.  To avoid waiting on hold if you call. Utilize the secure chat feature and request [facility] call you to complete the transaction or expedite your concerns.  Start a daily fiber supplement Benefiber  A referral was place for Pelvic floor therapy they will contact you to schedule a appointment  If your blood pressure at your visit was 140/90 or greater, please contact your primary care physician to follow up on this.  _______________________________________________________  If you are age 83 or older, your body mass index should be between 23-30. Your Body mass index is 24.57 kg/m. If this is out of the aforementioned range listed, please consider follow up with your Primary Care Provider.  If you are age 16 or younger, your body mass index should be between 19-25. Your Body mass index is 24.57 kg/m. If this is out of the aformentioned range listed, please consider follow up with your Primary Care Provider.   ________________________________________________________  The [facility] providers would like to encourage you to use [facility] to communicate with providers for non-urgent requests or questions.  Due to long hold times on the telephone, sending your provider a message by [facility] may be a faster and more efficient way to get a response.  Please allow 48 business hours for a response.  Please remember that this is for non-urgent requests.   _______________________________________________________  Due to recent changes in healthcare laws, you may see the results of your imaging and laboratory studies on MyChart before your provider has had a chance to review them.  We understand that in some cases there may be results that are confusing or concerning to you. Not all laboratory results come back in the same time frame and the provider may be waiting for multiple results in order to interpret others.  Please give [place] 48 hours in order for your provider to thoroughly review all the results before contacting the office for clarification of your results.   Thank you for entrusting me with your care and for choosing [facility], Dr. Eulah Pont

## 2024-02-01 NOTE — Progress Notes (Addendum)
 Chief Complaint: Fecal incontinence  HPI : 53 year old female with history of DM, hepatitis C s/p treatment, prior alcoholic pancreatitis, COPD/asthma, arthritis, and tobacco use presents with fecal incontinence  She has had fecal incontinence for the last year. The severity has varied. She not always aware that the fecal leakage is happening. Endorses pain in her anus, mostly at night time. Denies blood in the stools. She has on average 1 BM per day. Denies diarrhea or constipation. She has lost 25 lbs over the last 6 months that is unintentional. She has not been eating as much. 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 She used to be a heavy alcohol user, but she has not had alcohol in 2.5 years. Denies N&V, dysphagia, or abdominal pain. She does have chest burning and rergurgation on occasion that is not that bad. She has a history of hemorrhoids. Her last colonoscopy was 5 years ago that was done by [facility] in [place] with two polyps and she needed to come back in 10 years. Last EGD was in the hospital about 15 years ago due to an upper GI bleed.   Wt Readings from Last 3 Encounters:  02/01/24 143 lb 2 oz (64.9 kg)  01/18/24 146 lb 9.6 oz (66.5 kg)  11/08/23 155 lb 9.6 oz (70.6 kg)   Past Medical History:  Diagnosis Date   Anemia    Anxiety    Arthritis    Asthma    Chest pain    COPD (chronic obstructive pulmonary disease) (HCC)    Depression    Difficulty breathing    Frequent headaches    History of hepatitis C    IBS (irritable bowel syndrome)    Palpitations    Pancreatitis    Poor circulation     Past Surgical History:  Procedure Laterality Date   CARPAL TUNNEL RELEASE Bilateral    COLONOSCOPY     around age 31. [facility] Dr Monico Anna said they removed polyps   SKIN GRAFT Bilateral    Family History  Problem Relation Age of Onset   Colon cancer Mother    Bladder Cancer Mother        initially and spreaded to the colon and all over per  patient   COPD Mother    Glaucoma Mother    Irritable bowel syndrome Mother    Heart disease Father    Hypertension Father    Diabetes Father    Diabetes Paternal Grandmother    Social History   Tobacco Use   Smoking status: Every Day    Current packs/day: 0.50    Average packs/day: 0.5 packs/day for 35.0 years (17.5 ttl pk-yrs)    Types: Cigarettes   Smokeless tobacco: Never   Tobacco comments:    Pt states she started back smoking 1 month ago and is smoking 1 ppd. ALS 03/31/23  Vaping Use   Vaping status: Never Used  Substance Use Topics   Alcohol use: Not Currently    Comment: 2 months sober.   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   Current Outpatient Medications  Medication Sig Dispense Refill   Accu-Chek Softclix Lancets lancets Use as instructed 100 each 12   albuterol  (VENTOLIN  HFA) 108 (90 Base) MCG/ACT inhaler Inhale 2 puffs into the lungs every 6 (six) hours as needed for wheezing or shortness of breath.     benzonatate  (TESSALON ) 100 MG capsule Take 1 capsule (100 mg  total) by mouth every 8 (eight) hours. 21 capsule 0   Blood Glucose Monitoring Suppl (ACCU-CHEK GUIDE) w/Device KIT use as directed once daily 1 kit 0   Blood Glucose Monitoring Suppl (TRUE METRIX METER) w/Device KIT Use to check blood sugar 2 (two) times daily. 1 kit 0   budesonide -formoterol  (SYMBICORT ) 160-4.5 MCG/ACT inhaler Inhale 2 puffs into the lungs 2 (two) times daily. 10.2 g 3   Continuous Glucose Sensor (FREESTYLE LIBRE 3 SENSOR) MISC Place 1 sensor on the skin every 14 days. Use to check glucose continuously 3 each 12   doxycycline  (VIBRAMYCIN ) 100 MG capsule Take 1 capsule (100 mg total) by mouth 2 (two) times daily for 10 days. 20 capsule 0   fluticasone  (FLONASE ) 50 MCG/ACT nasal spray Place 1 spray into both nostrils 2 (two) times daily. 16 g 3   gabapentin  (NEURONTIN ) 100 MG capsule Take 1 capsule (100 mg total) by mouth at bedtime. 90 capsule 3    glucose blood (ACCU-CHEK GUIDE) test strip use  2 (two) times daily as instructed 100 each 12   glucose blood (TRUE METRIX BLOOD GLUCOSE TEST) test strip Use as instructed 100 each 12   ibuprofen  (ADVIL ) 800 MG tablet Take 1 tablet (800 mg total) by mouth every 6 (six) hours for mild pain/inflammation. 30 tablet 1   ipratropium-albuterol  (DUONEB) 0.5-2.5 (3) MG/3ML SOLN Take 3 mLs by nebulization every 6 (six) hours as needed. 120 mL 0   metFORMIN  (GLUCOPHAGE -XR) 500 MG 24 hr tablet Take 1 tablet (500 mg total) by mouth 2 (two) times daily with a meal. 180 tablet 0   montelukast  (SINGULAIR ) 10 MG tablet Take 1 tablet (10 mg total) by mouth daily. Needs office visit. 60 tablet 2   nystatin  (MYCOSTATIN ) 100000 UNIT/ML suspension Take 5 mLs (500,000 Units total) by mouth 4 (four) times daily. 120 mL 0   omeprazole  (PRILOSEC) 40 MG capsule Take 1 capsule (40 mg total) by mouth at bedtime. 60 capsule 2   predniSONE  (DELTASONE ) 20 MG tablet Take 3 tablets (60 mg total) by mouth daily. 15 tablet 0   risperiDONE  (RISPERDAL ) 3 MG tablet Take 1 tablet (3 mg total) by mouth daily. 90 tablet 1   solifenacin  (VESICARE ) 5 MG tablet Take 1 tablet (5 mg total) by mouth daily. 90 tablet 1   Tiotropium Bromide  Monohydrate (SPIRIVA  RESPIMAT) 2.5 MCG/ACT AERS Inhale 2 puffs into the lungs daily. 4 g 11   traZODone  (DESYREL ) 150 MG tablet Take 1 tablet (150 mg total) by mouth at bedtime as needed. 90 tablet 1   TRUEplus Lancets 28G MISC Use to check blood sugar 2 (two) times daily. 100 each 3   No current facility-administered medications for this visit.   Allergies  Allergen Reactions   Hydrocodone      Other reaction(s): Unknown/See Comments "Feel really high"     Review of Systems: All systems reviewed and negative except where noted in HPI.   Physical Exam: BP 96/62   Pulse 91   Ht 5\' 4"  (1.626 m)   Wt 143 lb 2 oz (64.9 kg)   BMI 24.57 kg/m  Constitutional: Pleasant,well-developed, female in no  acute distress. HEENT: Normocephalic and atraumatic. Conjunctivae are normal. No scleral icterus. Cardiovascular: Normal rate, regular rhythm.  Pulmonary/chest: Effort normal and breath sounds normal. No wheezing, rales or rhonchi. Abdominal: Soft, nondistended, tender in RUQ ab pain. Bowel sounds active throughout. There are no masses palpable. No hepatomegaly. Rectal: Declined. She preferred to wait until the colonoscopy. Extremities: No  edema Neurological: Alert and oriented to person place and time. Skin: Skin is warm and dry. No rashes noted. Psychiatric: Normal mood and affect. Behavior is normal.  Labs 03/2022: LFTs nml.   Labs 09/2022: HCV RNA negative.   Labs 10/2023: CBC nml. BMP with elevated glucose of 293.   Labs 11/2023: HbA1C 8.5%  ASSESSMENT AND PLAN: Fecal incontinence Suspected pancreatic insufficiency RUQ ab pain Change in bowel habits Rectal pain Weight loss History of EtOH pancreatitis Patient has had fecal incontinence over the last year and weight loss of 25 pounds over the last 6 months.  She does have issues with rectal pain.  Patient's last colonoscopy was 5 years ago but with her recent development of incontinence, will plan for a colonoscopy to evaluate for alternative sources of fecal incontinence. She declined a rectal exam today since a rectal exam can be done during her upcoming colonoscopy.  Patient was slightly tender to palpation in the right upper quadrant.  Thus will plan for CT scan for further evaluation of this pain.  Will have the patient start a daily fiber supplement and go to pelvic floor physical therapy.  - Start daily fiber supplement - Pelvic floor PT - CT A/P w/contrast - Colonoscopy [facility]  Regino Caprio, MD  I spent 50 minutes of time, including in depth chart review, independent review of results as outlined above, communicating results with the patient directly, face-to-face time with the patient, coordinating care, ordering studies  and medications as appropriate, and documentation.

## 2024-02-06 ENCOUNTER — Ambulatory Visit (HOSPITAL_COMMUNITY): Payer: 59

## 2024-02-20 ENCOUNTER — Ambulatory Visit (HOSPITAL_COMMUNITY): Payer: 59

## 2024-02-22 ENCOUNTER — Encounter: Payer: Self-pay | Admitting: Internal Medicine

## 2024-02-29 ENCOUNTER — Other Ambulatory Visit: Payer: Self-pay

## 2024-02-29 ENCOUNTER — Other Ambulatory Visit (HOSPITAL_COMMUNITY): Payer: Self-pay

## 2024-02-29 ENCOUNTER — Encounter: Payer: Self-pay | Admitting: Internal Medicine

## 2024-02-29 ENCOUNTER — Ambulatory Visit (AMBULATORY_SURGERY_CENTER): Payer: 59 | Admitting: Internal Medicine

## 2024-02-29 VITALS — BP 130/94 | HR 89 | Temp 98.2°F | Resp 23 | Ht 64.0 in | Wt 143.0 lb

## 2024-02-29 DIAGNOSIS — D122 Benign neoplasm of ascending colon: Secondary | ICD-10-CM | POA: Diagnosis not present

## 2024-02-29 DIAGNOSIS — K621 Rectal polyp: Secondary | ICD-10-CM | POA: Diagnosis not present

## 2024-02-29 DIAGNOSIS — K648 Other hemorrhoids: Secondary | ICD-10-CM

## 2024-02-29 DIAGNOSIS — R159 Full incontinence of feces: Secondary | ICD-10-CM

## 2024-02-29 DIAGNOSIS — D123 Benign neoplasm of transverse colon: Secondary | ICD-10-CM

## 2024-02-29 DIAGNOSIS — K6289 Other specified diseases of anus and rectum: Secondary | ICD-10-CM | POA: Diagnosis not present

## 2024-02-29 DIAGNOSIS — R1011 Right upper quadrant pain: Secondary | ICD-10-CM

## 2024-02-29 DIAGNOSIS — J449 Chronic obstructive pulmonary disease, unspecified: Secondary | ICD-10-CM | POA: Diagnosis not present

## 2024-02-29 DIAGNOSIS — R194 Change in bowel habit: Secondary | ICD-10-CM

## 2024-02-29 DIAGNOSIS — R634 Abnormal weight loss: Secondary | ICD-10-CM

## 2024-02-29 DIAGNOSIS — F32A Depression, unspecified: Secondary | ICD-10-CM | POA: Diagnosis not present

## 2024-02-29 DIAGNOSIS — K5730 Diverticulosis of large intestine without perforation or abscess without bleeding: Secondary | ICD-10-CM

## 2024-02-29 MED ORDER — SODIUM CHLORIDE 0.9 % IV SOLN: 500.0000 mL | Freq: Once | INTRAVENOUS | Status: DC

## 2024-02-29 MED ORDER — HYDROCORTISONE (PERIANAL) 2.5 % EX CREA
1.0000 | TOPICAL_CREAM | Freq: Two times a day (BID) | CUTANEOUS | 1 refills | Status: AC
  Filled 2024-02-29: qty 30, 10d supply, fill #0

## 2024-02-29 NOTE — Patient Instructions (Addendum)
 Resume previous diet and medications. Awaiting pathology results. Anusol HC cream BID for 7 days. Start a daily stool softner such as docusate. Return to GI clinic in 2-3 months-Patient will call office to schedule.  YOU HAD AN ENDOSCOPIC PROCEDURE TODAY AT THE [facility]:   Refer to the procedure report that was given to you for any specific questions about what was found during the examination.  If the procedure report does not answer your questions, please call your gastroenterologist to clarify.  If you requested that your care partner not be given the details of your procedure findings, then the procedure report has been included in a sealed envelope for you to review at your convenience later.  YOU SHOULD EXPECT: Some feelings of bloating in the abdomen. Passage of more gas than usual.  Walking can help get rid of the air that was put into your GI tract during the procedure and reduce the bloating. If you had a lower endoscopy (such as a colonoscopy or flexible sigmoidoscopy) you may notice spotting of blood in your stool or on the toilet paper. If you underwent a bowel prep for your procedure, you may not have a normal bowel movement for a few days.  Please Note:  You might notice some irritation and congestion in your nose or some drainage.  This is from the oxygen used during your procedure.  There is no need for concern and it should clear up in a day or so.  SYMPTOMS TO REPORT IMMEDIATELY:  Following lower endoscopy (colonoscopy or flexible sigmoidoscopy):  Excessive amounts of blood in the stool  Significant tenderness or worsening of abdominal pains  Swelling of the abdomen that is new, acute  Fever of 100F or higher  For urgent or emergent issues, a gastroenterologist can be reached at any hour by calling [phone]. Do not use MyChart messaging for urgent concerns.    DIET:  We do recommend a small meal at first, but then you may proceed to your regular diet.   Drink plenty of fluids but you should avoid alcoholic beverages for 24 hours.  ACTIVITY:  You should plan to take it easy for the rest of today and you should NOT DRIVE or use heavy machinery until tomorrow (because of the sedation medicines used during the test).    FOLLOW UP: Our staff will call the number listed on your records the next business day following your procedure.  We will call around 7:15- 8:00 am to check on you and address any questions or concerns that you may have regarding the information given to you following your procedure. If we do not reach you, we will leave a message.     If any biopsies were taken you will be contacted by phone or by letter within the next 1-3 weeks.  Please call us at [phone] if you have not heard about the biopsies in 3 weeks.    SIGNATURES/CONFIDENTIALITY: You and/or your care partner have signed paperwork which will be entered into your electronic medical record.  These signatures attest to the fact that that the information above on your After Visit Summary has been reviewed and is understood.  Full responsibility of the confidentiality of this discharge information lies with you and/or your care-partner.

## 2024-02-29 NOTE — Op Note (Signed)
 [facility] Patient Name: Candace Howard Procedure Date: 02/29/2024 11:11 AM MRN: [id number] Endoscopist: Madelyn Brunner Dodson , , [id number] Age: 53 Referring MD:  Date of Birth: 07-Apr-1971 Gender: Female Account #: [id number] Procedure:                Colonoscopy Indications:              Fecal incontinence, Rectal pain, Weight loss Medicines:                Monitored Anesthesia Care Procedure:                Pre-Anesthesia Assessment:                           - Prior to the procedure, a History and Physical                            was performed, and patient medications and                            allergies were reviewed. The patient's tolerance of                            previous anesthesia was also reviewed. The risks                            and benefits of the procedure and the sedation                            options and risks were discussed with the patient.                            All questions were answered, and informed consent                            was obtained. Prior Anticoagulants: The patient has                            taken no anticoagulant or antiplatelet agents. ASA                            Grade Assessment: II - A patient with mild systemic                            disease. After reviewing the risks and benefits,                            the patient was deemed in satisfactory condition to                            undergo the procedure.                           After obtaining informed consent, the colonoscope  was passed under direct vision. Throughout the                            procedure, the patient's blood pressure, pulse, and                            oxygen saturations were monitored continuously. The                            Colonoscope was introduced through the anus and                            advanced to the the terminal ileum. The colonoscopy                            was  performed without difficulty. The patient                            tolerated the procedure well. The quality of the                            bowel preparation was good. The terminal ileum,                            ileocecal valve, appendiceal orifice, and rectum                            were photographed. Scope In: 11:27:41 AM Scope Out: 11:52:36 AM Scope Withdrawal Time: 0 hours 22 minutes 30 seconds  Total Procedure Duration: 0 hours 24 minutes 55 seconds  Findings:                 The terminal ileum appeared normal.                           Four sessile polyps were found in the transverse                            colon and ascending colon. The polyps were 3 to 7                            mm in size. These polyps were removed with a cold                            snare. Resection and retrieval were complete.                           Multiple diverticula were found in the sigmoid                            colon, descending colon and transverse colon.                           Two sessile polyps were found in the rectum. The  polyps were 3 to 5 mm in size. These polyps were                            removed with a cold snare. Resection and retrieval                            were complete.                           Non-bleeding internal hemorrhoids were found during                            retroflexion. Complications:            No immediate complications. Estimated Blood Loss:     Estimated blood loss was minimal. Impression:               - The examined portion of the ileum was normal.                           - Four 3 to 7 mm polyps in the transverse colon and                            in the ascending colon, removed with a cold snare.                            Resected and retrieved.                           - Diverticulosis in the sigmoid colon, in the                            descending colon and in the transverse colon.                            - Two 3 to 5 mm polyps in the rectum, removed with                            a cold snare. Resected and retrieved.                           - Non-bleeding internal hemorrhoids. Recommendation:           - Discharge patient to home (with escort).                           - Await pathology results.                           - Anusol HC cream BID for 7 days.                           - Start a daily stool softener such as docusate.                           -  Return to GI clinic in 2-3 months.                           - The findings and recommendations were discussed                            with the patient. Dr Particia Lather "Alan Ripper" Leonides Schanz,  02/29/2024 12:00:33 PM

## 2024-02-29 NOTE — Progress Notes (Signed)
 GASTROENTEROLOGY PROCEDURE H&P NOTE   Primary Care Physician: Hoy Register, MD    Reason for Procedure:   Change in bowel habits, fecal incontinence, weight loss, rectal pain  Plan:    Colonoscopy  Patient is appropriate for endoscopic procedure(s) in the ambulatory ([facility]) setting.  The nature of the procedure, as well as the risks, benefits, and alternatives were carefully and thoroughly reviewed with the patient. Ample time for discussion and questions allowed. The patient understood, was satisfied, and agreed to proceed.     HPI: Candace Howard is a 53 y.o. female who presents for colonoscopy for evaluation of change in bowel habits, fecal incontinence, weight loss, rectal pain.  Patient was most recently seen in the Gastroenterology Clinic on 02/01/24.  No interval change in medical history since that appointment. Please refer to that note for full details regarding GI history and clinical presentation.   Past Medical History:  Diagnosis Date   Anemia    Anxiety    Arthritis    Asthma    Chest pain    COPD (chronic obstructive pulmonary disease) (HCC)    Depression    Difficulty breathing    Frequent headaches    History of hepatitis C    IBS (irritable bowel syndrome)    Palpitations    Pancreatitis    Poor circulation     Past Surgical History:  Procedure Laterality Date   CARPAL TUNNEL RELEASE Bilateral    COLONOSCOPY     around age 19. [facility] Dr Jennye Boroughs said they removed polyps   SKIN GRAFT Bilateral     Prior to Admission medications   Medication Sig Start Date End Date Taking? Authorizing Provider  gabapentin (NEURONTIN) 100 MG capsule Take 1 capsule (100 mg total) by mouth at bedtime. 01/18/24  Yes Toy Cookey E, NP  risperiDONE (RISPERDAL) 3 MG tablet Take 1 tablet (3 mg total) by mouth daily. 01/18/24  Yes Toy Cookey E, NP  traZODone (DESYREL) 150 MG tablet Take 1 tablet (150 mg total) by mouth at bedtime as needed. 01/18/24   Yes Toy Cookey E, NP  Accu-Chek Softclix Lancets lancets Use as instructed 04/01/22   Mayers, Cari S, PA-C  albuterol (VENTOLIN HFA) 108 (90 Base) MCG/ACT inhaler Inhale 2 puffs into the lungs every 6 (six) hours as needed for wheezing or shortness of breath. 11/16/23   Dione Booze, MD  benzonatate (TESSALON) 100 MG capsule Take 1 capsule (100 mg total) by mouth every 8 (eight) hours. 11/16/23   Dione Booze, MD  Blood Glucose Monitoring Suppl (ACCU-CHEK GUIDE) w/Device KIT use as directed once daily 05/20/23   Georganna Skeans, MD  Blood Glucose Monitoring Suppl (TRUE METRIX METER) w/Device KIT Use to check blood sugar 2 (two) times daily. 08/08/23   Anders Simmonds, PA-C  budesonide-formoterol (SYMBICORT) 160-4.5 MCG/ACT inhaler Inhale 2 puffs into the lungs 2 (two) times daily. 08/08/23   Anders Simmonds, PA-C  Continuous Glucose Sensor (FREESTYLE LIBRE 3 SENSOR) MISC Place 1 sensor on the skin every 14 days. Use to check glucose continuously 11/05/23   Eleonore Chiquito, FNP  doxycycline (VIBRAMYCIN) 100 MG capsule Take 1 capsule (100 mg total) by mouth 2 (two) times daily for 10 days. 11/16/23   Dione Booze, MD  fluticasone [facility]) 50 MCG/ACT nasal spray Place 1 spray into both nostrils 2 (two) times daily. 08/08/23   Anders Simmonds, PA-C  glucose blood (ACCU-CHEK GUIDE) test strip use  2 (two) times daily as instructed 05/20/23  Georganna Skeans, MD  glucose blood (TRUE METRIX BLOOD GLUCOSE TEST) test strip Use as instructed 08/08/23   Anders Simmonds, PA-C  ibuprofen (ADVIL) 800 MG tablet Take 1 tablet (800 mg total) by mouth every 6 (six) hours for mild pain/inflammation. 04/18/23     ipratropium-albuterol (DUONEB) 0.5-2.5 (3) MG/3ML SOLN Take 3 mLs by nebulization every 6 (six) hours as needed. 04/27/22   Storm Frisk, MD  metFORMIN (GLUCOPHAGE-XR) 500 MG 24 hr tablet Take 1 tablet (500 mg total) by mouth 2 (two) times daily with a meal. 09/08/23   Storm Frisk, MD  montelukast  (SINGULAIR) 10 MG tablet Take 1 tablet (10 mg total) by mouth daily. Needs office visit. 08/08/23   Anders Simmonds, PA-C  nystatin (MYCOSTATIN) 100000 UNIT/ML suspension Take 5 mLs (500,000 Units total) by mouth 4 (four) times daily. 01/18/24   Sharlene Dory, DO  omeprazole (PRILOSEC) 40 MG capsule Take 1 capsule (40 mg total) by mouth at bedtime. 02/02/23   Storm Frisk, MD  predniSONE (DELTASONE) 20 MG tablet Take 3 tablets (60 mg total) by mouth daily. 11/16/23   Dione Booze, MD  solifenacin (VESICARE) 5 MG tablet Take 1 tablet (5 mg total) by mouth daily. 11/08/23   Hoy Register, MD  Tiotropium Bromide Monohydrate (SPIRIVA RESPIMAT) 2.5 MCG/ACT AERS Inhale 2 puffs into the lungs daily. 03/31/23   Hunsucker, Lesia Sago, MD  TRUEplus Lancets 28G MISC Use to check blood sugar 2 (two) times daily. 08/08/23   Anders Simmonds, PA-C    Current Outpatient Medications  Medication Sig Dispense Refill   gabapentin (NEURONTIN) 100 MG capsule Take 1 capsule (100 mg total) by mouth at bedtime. 90 capsule 3   risperiDONE (RISPERDAL) 3 MG tablet Take 1 tablet (3 mg total) by mouth daily. 90 tablet 1   traZODone (DESYREL) 150 MG tablet Take 1 tablet (150 mg total) by mouth at bedtime as needed. 90 tablet 1   Accu-Chek Softclix Lancets lancets Use as instructed 100 each 12   albuterol (VENTOLIN HFA) 108 (90 Base) MCG/ACT inhaler Inhale 2 puffs into the lungs every 6 (six) hours as needed for wheezing or shortness of breath.     benzonatate (TESSALON) 100 MG capsule Take 1 capsule (100 mg total) by mouth every 8 (eight) hours. 21 capsule 0   Blood Glucose Monitoring Suppl (ACCU-CHEK GUIDE) w/Device KIT use as directed once daily 1 kit 0   Blood Glucose Monitoring Suppl (TRUE METRIX METER) w/Device KIT Use to check blood sugar 2 (two) times daily. 1 kit 0   budesonide-formoterol (SYMBICORT) 160-4.5 MCG/ACT inhaler Inhale 2 puffs into the lungs 2 (two) times daily. 10.2 g 3   Continuous Glucose  Sensor (FREESTYLE LIBRE 3 SENSOR) MISC Place 1 sensor on the skin every 14 days. Use to check glucose continuously 3 each 12   doxycycline (VIBRAMYCIN) 100 MG capsule Take 1 capsule (100 mg total) by mouth 2 (two) times daily for 10 days. 20 capsule 0   fluticasone (FLONASE) 50 MCG/ACT nasal spray Place 1 spray into both nostrils 2 (two) times daily. 16 g 3   glucose blood (ACCU-CHEK GUIDE) test strip use  2 (two) times daily as instructed 100 each 12   glucose blood (TRUE METRIX BLOOD GLUCOSE TEST) test strip Use as instructed 100 each 12   ibuprofen (ADVIL) 800 MG tablet Take 1 tablet (800 mg total) by mouth every 6 (six) hours for mild pain/inflammation. 30 tablet 1   ipratropium-albuterol (DUONEB) 0.5-2.5 (  3) MG/3ML SOLN Take 3 mLs by nebulization every 6 (six) hours as needed. 120 mL 0   metFORMIN (GLUCOPHAGE-XR) 500 MG 24 hr tablet Take 1 tablet (500 mg total) by mouth 2 (two) times daily with a meal. 180 tablet 0   montelukast (SINGULAIR) 10 MG tablet Take 1 tablet (10 mg total) by mouth daily. Needs office visit. 60 tablet 2   nystatin (MYCOSTATIN) 100000 UNIT/ML suspension Take 5 mLs (500,000 Units total) by mouth 4 (four) times daily. 120 mL 0   omeprazole (PRILOSEC) 40 MG capsule Take 1 capsule (40 mg total) by mouth at bedtime. 60 capsule 2   predniSONE (DELTASONE) 20 MG tablet Take 3 tablets (60 mg total) by mouth daily. 15 tablet 0   solifenacin (VESICARE) 5 MG tablet Take 1 tablet (5 mg total) by mouth daily. 90 tablet 1   Tiotropium Bromide Monohydrate (SPIRIVA RESPIMAT) 2.5 MCG/ACT AERS Inhale 2 puffs into the lungs daily. 4 g 11   TRUEplus Lancets 28G MISC Use to check blood sugar 2 (two) times daily. 100 each 3   Current Facility-Administered Medications  Medication Dose Route Frequency Provider Last Rate Last Admin   0.9 %  sodium chloride infusion  500 mL Intravenous Once Imogene Burn, MD        Allergies as of 02/29/2024 - Review Complete 02/29/2024  Allergen Reaction  Noted   Hydrocodone Other (See Comments) 08/18/2021    Family History  Problem Relation Age of Onset   Colon cancer Mother    Bladder Cancer Mother        initially and spreaded to the colon and all over per patient   COPD Mother    Glaucoma Mother    Irritable bowel syndrome Mother    Heart disease Father    Hypertension Father    Diabetes Father    Diabetes Paternal Grandmother     Social History   Socioeconomic History   Marital status: Single    Spouse name: Not on file   Number of children: Not on file   Years of education: Not on file   Highest education level: Not on file  Occupational History   Not on file  Tobacco Use   Smoking status: Every Day    Current packs/day: 0.50    Average packs/day: 0.5 packs/day for 35.0 years (17.5 ttl pk-yrs)    Types: Cigarettes   Smokeless tobacco: Never   Tobacco comments:    Pt states she started back smoking 1 month ago and is smoking 1 ppd. ALS 03/31/23  Vaping Use   Vaping status: Never Used  Substance and Sexual Activity   Alcohol use: Not Currently    Comment: 2 months sober.   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   Sexual activity: Not Currently  Other Topics Concern   Not on file  Social History Narrative   Not on file   Social Drivers of Health   Financial Resource Strain: High Risk (01/11/2022)   Overall Financial Resource Strain (CARDIA)    Difficulty of Paying Living Expenses: Hard  Food Insecurity: Low Risk  (04/28/2023)   [facility]   Hunger Vital Sign    Worried About Running Out of Food in the Last Year: Never true    Ran Out of Food in the Last Year: Never true  Transportation Needs: Not on file (04/28/2023)  Physical Activity: Inactive (01/11/2022)   Exercise Vital Sign  Days of Exercise per Week: 0 days    Minutes of Exercise per Session: 0 min  Stress: Stress Concern Present (01/11/2022)   [employer] of Occupational Health -  Occupational Stress Questionnaire    Feeling of Stress : Very much  Social Connections: Socially Isolated (01/11/2022)   Social Connection and Isolation Panel [NHANES]    Frequency of Communication with Friends and Family: More than three times a week    Frequency of Social Gatherings with Friends and Family: Three times a week    Attends Religious Services: Never    Active Member of Clubs or Organizations: No    Attends Banker Meetings: Never    Marital Status: Never married  Intimate Partner Violence: Not At Risk (01/11/2022)   Humiliation, Afraid, Rape, and Kick questionnaire    Fear of Current or Ex-Partner: No    Emotionally Abused: No    Physically Abused: No    Sexually Abused: No    Physical Exam: Vital signs in last 24 hours: BP 117/77   Pulse 92   Temp 98.2 F (36.8 C) (Skin)   Ht 5\' 4"  (1.626 m)   Wt 143 lb (64.9 kg)   SpO2 96%   BMI 24.55 kg/m  GEN: NAD EYE: Sclerae anicteric ENT: MMM CV: Non-tachycardic Pulm: No increased WOB GI: Soft NEURO:  Alert & Oriented   Eulah Pont, MD  Gastroenterology   02/29/2024 11:11 AM

## 2024-02-29 NOTE — Progress Notes (Signed)
 Called to room to assist during endoscopic procedure.  Patient ID and intended procedure confirmed with present staff. Received instructions for my participation in the procedure from the performing physician.

## 2024-02-29 NOTE — Progress Notes (Signed)
 Pt A/O x 3, gd SR's, pleased with anesthesia, report to RN

## 2024-03-01 ENCOUNTER — Telehealth: Payer: Self-pay

## 2024-03-01 NOTE — Telephone Encounter (Signed)
  Follow up Call-     02/29/2024   10:14 AM  Call back number  Post procedure Call Back phone  # [phone]  Permission to leave phone message Yes     Patient questions:  Do you have a fever, pain , or abdominal swelling? No. Pain Score  0 *  Have you tolerated food without any problems? Yes.    Have you been able to return to your normal activities? Yes.    Do you have any questions about your discharge instructions: Diet   No. Medications  No. Follow up visit  No.  Do you have questions or concerns about your Care? No.  Actions: * If pain score is 4 or above: No action needed, pain <4.

## 2024-03-02 ENCOUNTER — Encounter: Payer: Self-pay | Admitting: Internal Medicine

## 2024-03-02 LAB — SURGICAL PATHOLOGY

## 2024-03-06 ENCOUNTER — Ambulatory Visit (HOSPITAL_COMMUNITY)
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
## 2024-03-12 ENCOUNTER — Encounter: Payer: 59 | Admitting: Pharmacist

## 2024-03-15 ENCOUNTER — Other Ambulatory Visit (HOSPITAL_COMMUNITY): Payer: Self-pay

## 2024-03-17 ENCOUNTER — Other Ambulatory Visit (HOSPITAL_COMMUNITY): Payer: Self-pay

## 2024-03-22 ENCOUNTER — Other Ambulatory Visit (HOSPITAL_COMMUNITY): Payer: Self-pay

## 2024-03-26 ENCOUNTER — Other Ambulatory Visit (HOSPITAL_COMMUNITY): Payer: Self-pay

## 2024-03-26 MED ORDER — TRAZODONE HCL 150 MG PO TABS
150.0000 mg | ORAL_TABLET | Freq: Every evening | ORAL | 0 refills | Status: DC | PRN
Start: 2024-01-18 — End: 2024-04-11
  Filled 2024-03-26: qty 30, 30d supply, fill #0

## 2024-03-27 ENCOUNTER — Ambulatory Visit (HOSPITAL_COMMUNITY)
Admission: RE | Admit: 2024-03-27 | Discharge: 2024-03-27 | Disposition: A | Discharge status: Home / Self Care | Source: Ambulatory Visit | Attending: Internal Medicine | Admitting: Internal Medicine

## 2024-03-27 DIAGNOSIS — K6289 Other specified diseases of anus and rectum: Secondary | ICD-10-CM

## 2024-03-27 DIAGNOSIS — R159 Full incontinence of feces: Secondary | ICD-10-CM

## 2024-03-27 DIAGNOSIS — R634 Abnormal weight loss: Secondary | ICD-10-CM

## 2024-03-27 DIAGNOSIS — K7689 Other specified diseases of liver: Secondary | ICD-10-CM | POA: Diagnosis not present

## 2024-03-27 DIAGNOSIS — R1011 Right upper quadrant pain: Secondary | ICD-10-CM

## 2024-03-27 DIAGNOSIS — K861 Other chronic pancreatitis: Secondary | ICD-10-CM | POA: Diagnosis not present

## 2024-03-27 DIAGNOSIS — K8689 Other specified diseases of pancreas: Secondary | ICD-10-CM | POA: Diagnosis not present

## 2024-03-27 DIAGNOSIS — R194 Change in bowel habit: Secondary | ICD-10-CM

## 2024-03-27 MED ORDER — IOHEXOL 9 MG/ML PO SOLN
1000.0000 mL | ORAL | Status: AC
  Administered 2024-03-27: 1000 mL via ORAL

## 2024-03-27 MED ORDER — IOHEXOL 300 MG/ML  SOLN
100.0000 mL | Freq: Once | INTRAMUSCULAR | Status: AC | PRN
  Administered 2024-03-27: 100 mL via INTRAVENOUS

## 2024-03-27 MED ORDER — IOHEXOL 9 MG/ML PO SOLN
ORAL | Status: AC
  Filled 2024-03-27: qty 1000

## 2024-03-27 MED ORDER — SODIUM CHLORIDE (PF) 0.9 % IJ SOLN
INTRAMUSCULAR | Status: AC
  Filled 2024-03-27: qty 50

## 2024-04-02 ENCOUNTER — Other Ambulatory Visit (HOSPITAL_COMMUNITY): Payer: Self-pay

## 2024-04-09 ENCOUNTER — Encounter: Payer: Self-pay | Admitting: Internal Medicine

## 2024-04-10 ENCOUNTER — Other Ambulatory Visit (HOSPITAL_COMMUNITY): Payer: Self-pay

## 2024-04-10 ENCOUNTER — Other Ambulatory Visit: Payer: Self-pay

## 2024-04-10 ENCOUNTER — Telehealth: Payer: Self-pay

## 2024-04-10 DIAGNOSIS — R194 Change in bowel habit: Secondary | ICD-10-CM

## 2024-04-10 DIAGNOSIS — R1011 Right upper quadrant pain: Secondary | ICD-10-CM

## 2024-04-10 DIAGNOSIS — K861 Other chronic pancreatitis: Secondary | ICD-10-CM

## 2024-04-10 DIAGNOSIS — R634 Abnormal weight loss: Secondary | ICD-10-CM

## 2024-04-10 MED ORDER — PANCRELIPASE (LIP-PROT-AMYL) 36000-114000 UNITS PO CPEP
ORAL_CAPSULE | ORAL | 11 refills | Status: DC
  Filled 2024-04-10: qty 300, 75d supply, fill #0
  Filled 2024-04-13: qty 200, 28d supply, fill #0

## 2024-04-10 NOTE — Telephone Encounter (Signed)
 Pharmacy Patient Advocate Encounter   Received notification from CoverMyMeds that prior authorization for Creon  36000-114000 UNIT dr capsules is required/requested.   Insurance verification completed.   The patient is insured through [facility] .   Per test claim: PA required; PA submitted to above mentioned insurance via CoverMyMeds Key/confirmation #/EOC [facility] Status is pending

## 2024-04-11 ENCOUNTER — Other Ambulatory Visit (HOSPITAL_COMMUNITY): Payer: Self-pay

## 2024-04-11 ENCOUNTER — Other Ambulatory Visit: Payer: Self-pay

## 2024-04-11 ENCOUNTER — Encounter: Payer: Self-pay | Admitting: Pharmacist

## 2024-04-11 ENCOUNTER — Encounter (HOSPITAL_COMMUNITY): Payer: Self-pay | Admitting: Psychiatry

## 2024-04-11 ENCOUNTER — Telehealth (INDEPENDENT_AMBULATORY_CARE_PROVIDER_SITE_OTHER): Payer: MEDICAID | Admitting: Psychiatry

## 2024-04-11 DIAGNOSIS — F3160 Bipolar disorder, current episode mixed, unspecified: Secondary | ICD-10-CM | POA: Diagnosis not present

## 2024-04-11 DIAGNOSIS — F411 Generalized anxiety disorder: Secondary | ICD-10-CM

## 2024-04-11 MED ORDER — RISPERIDONE 3 MG PO TABS
3.0000 mg | ORAL_TABLET | Freq: Every day | ORAL | 3 refills | Status: DC
Start: 2024-04-11 — End: 2024-05-30
  Filled 2024-04-11 – 2024-04-13 (×2): qty 30, 30d supply, fill #0

## 2024-04-11 MED ORDER — TRAZODONE HCL 150 MG PO TABS
150.0000 mg | ORAL_TABLET | Freq: Every evening | ORAL | 3 refills | Status: DC | PRN
  Filled 2024-04-11 – 2024-05-04 (×2): qty 30, 30d supply, fill #0

## 2024-04-11 MED ORDER — GABAPENTIN 100 MG PO CAPS
100.0000 mg | ORAL_CAPSULE | Freq: Every day | ORAL | 3 refills | Status: DC
  Filled 2024-04-11 – 2024-05-04 (×2): qty 30, 30d supply, fill #0

## 2024-04-11 NOTE — Telephone Encounter (Signed)
 Please see notes below.

## 2024-04-11 NOTE — Telephone Encounter (Signed)
 Please see note below.

## 2024-04-11 NOTE — Telephone Encounter (Signed)
 Pharmacy Patient Advocate Encounter  Received notification from [facility] that Prior Authorization for Creon  [id number] UNIT dr capsules has been DENIED.  Full denial letter will be uploaded to the media tab. See denial reason below.  Your plan only covers this drug when it is used for certain health conditions. 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 Your plan does not cover this drug for your health condition that our doctor told us  you ave. We reviewed the information we had. For this drug, you may have to meet other criteria.   Authorization may be granted when the requested drug is being prescribed for the treatment of exocrine pancreatic insufficiency when the following criteria is met:  If the request is for Viokace , the patient will take Viokace  in combination with a proton pump inhibitor (PPI)  PA #/Case ID/Reference #: Webb Hake

## 2024-04-11 NOTE — Telephone Encounter (Signed)
 Please see note below and advise

## 2024-04-11 NOTE — Progress Notes (Signed)
 BH MD/PA/NP OP Progress Note Virtual Visit via Video Note  I connected with Candace Howard on 04/11/24 at  9:00 AM EDT by a video enabled telemedicine application and verified that I am speaking with the correct person using two identifiers.  Location: Patient: Work Provider: Clinic   I discussed the limitations of evaluation and management by telemedicine and the availability of in person appointments. The patient expressed understanding and agreed to proceed.  I provided 30 minutes of non-face-to-face time during this encounter.   04/11/2024 9:19 AM Candace Howard  MRN:  [id number]  Chief Complaint:"My lack of motivation has me puzzled"  HPI: 53 year old female seen today for follow-up psychiatric evaluation.  She has a psychiatric history of substance use (Alcohol, Tobacco, meth, and cocaine in remission) Bipolar affective disorder, Depression, anxiety, and PTSD.  Currently she is being managed on Risperdal  3 mg nightly, gabapentin  100 mg 3 times daily (reports that she became addicted to higher doses), and trazodone  150 mg nightly as needed.  Today she reports her medications are effective in managing her psychiatric condition.  Today she was well-groomed, pleasant, cooperative, and engaged in conversation.  Patient informed Clinical research associate that her lack of motivation has her puzzled. She notes that she does not feel depressed but notes that someday's she has little energy and unable to get out of her bed. She also was recently diagnosed with chronic pancreatitis. She did start medications to assist but reports at times she experiences increased fatigue. Provider reccommended discussing this with her PCP. She endorsed understanding and agreed.    Since her last visit she inform her that her anxiety and depression are well-managed. Today provider conducted a GAD 7 and patient scored a 8,  at her last visit she scored an 6.  Provider also conducted PHQ-9 and patient scored a 3, at her last visit she  scored a 1.  She endorses adequate sleep and appetite.  Today she denies SI/HI/VAH or paranoia.    No medication changes made today.  Patient agreeable to continue medication as scribed.  Patient has not had routine psychiatric labs in a while.  She informed that she will attempt to have her labs drawn that were ordered at her last visit.  She will also follow-up with her to discuss her pancreatitis.  No other concerns at this time. Visit Diagnosis:    ICD-10-CM   1. Anxiety state  F41.1 gabapentin  (NEURONTIN ) 100 MG capsule    risperiDONE  (RISPERDAL ) 3 MG tablet    2. Bipolar affective disorder, current episode mixed, without psychotic features (HCC)  F31.60 gabapentin  (NEURONTIN ) 100 MG capsule    risperiDONE  (RISPERDAL ) 3 MG tablet         Past Psychiatric History: substance use (Alcohol, Tobacco, meth, and cocaine) Bipolar affective disorder, Depression, anxiety, and PTSD  Past Medical History:  Past Medical History:  Diagnosis Date   Anemia    Anxiety    Arthritis    Asthma    Chest pain    COPD (chronic obstructive pulmonary disease) (HCC)    Depression    Difficulty breathing    Frequent headaches    History of hepatitis C    IBS (irritable bowel syndrome)    Palpitations    Pancreatitis    Poor circulation     Past Surgical History:  Procedure Laterality Date   CARPAL TUNNEL RELEASE Bilateral    COLONOSCOPY     around age 19. [facility] Dr Monico Anna said they removed polyps   SKIN GRAFT  Bilateral     Family Psychiatric History:  Son Substance use, maternal 4 uncles SI, mother abuse of pill use   Family History:  Family History  Problem Relation Age of Onset   Colon cancer Mother    Bladder Cancer Mother        initially and spreaded to the colon and all over per patient   COPD Mother    Glaucoma Mother    Irritable bowel syndrome Mother    Heart disease Father    Hypertension Father    Diabetes Father    Diabetes Paternal Grandmother      Social History:  Social History   Socioeconomic History   Marital status: Single    Spouse name: Not on file   Number of children: Not on file   Years of education: Not on file   Highest education level: Not on file  Occupational History   Not on file  Tobacco Use   Smoking status: Every Day    Current packs/day: 0.50    Average packs/day: 0.5 packs/day for 35.0 years (17.5 ttl pk-yrs)    Types: Cigarettes   Smokeless tobacco: Never   Tobacco comments:    Pt states she started back smoking 1 month ago and is smoking 1 ppd. ALS 03/31/23  Vaping Use   Vaping status: Never Used  Substance and Sexual Activity   Alcohol use: Not Currently    Comment: 2 months sober.   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   Sexual activity: Not Currently  Other Topics Concern   Not on file  Social History Narrative   Not on file   Social Drivers of Health   Financial Resource Strain: High Risk (01/11/2022)   Overall Financial Resource Strain (CARDIA)    Difficulty of Paying Living Expenses: Hard  Food Insecurity: Low Risk  (04/28/2023)   [facility]   Hunger Vital Sign    Worried About Running Out of Food in the Last Year: Never true    Ran Out of Food in the Last Year: Never true  Transportation Needs: Not on file (04/28/2023)  Physical Activity: Inactive (01/11/2022)   Exercise Vital Sign    Days of Exercise per Week: 0 days    Minutes of Exercise per Session: 0 min  Stress: Stress Concern Present (01/11/2022)   [employer] of Occupational Health - Occupational Stress Questionnaire    Feeling of Stress : Very much  Social Connections: Socially Isolated (01/11/2022)   Social Connection and Isolation Panel [NHANES]    Frequency of Communication with Friends and Family: More than three times a week    Frequency of Social Gatherings with Friends and Family: Three times a week    Attends Religious Services: Never     Active Member of Clubs or Organizations: No    Attends Banker Meetings: Never    Marital Status: Never married    Allergies:  Allergies  Allergen Reactions   Hydrocodone  Other (See Comments)    Other reaction(s): Unknown/See Comments "Feel really high"    Metabolic Disorder Labs: Lab Results  Component Value Date   HGBA1C 8.5 (A) 12/01/2023   MPG 108.28 12/25/2021   No results found for: "PROLACTIN" Lab Results  Component Value Date   CHOL 182 12/27/2021   TRIG 114 12/27/2021   HDL 49 12/27/2021   CHOLHDL 3.7 12/27/2021   VLDL 23 12/27/2021   LDLCALC 110 (H) 12/27/2021  LDLCALC 130 (H) 12/25/2021   Lab Results  Component Value Date   TSH 0.636 12/25/2021    Therapeutic Level Labs: No results found for: "LITHIUM" No results found for: "VALPROATE" No results found for: "CBMZ"  Current Medications: Current Outpatient Medications  Medication Sig Dispense Refill   Accu-Chek Softclix Lancets lancets Use as instructed 100 each 12   albuterol  (VENTOLIN  HFA) 108 (90 Base) MCG/ACT inhaler Inhale 2 puffs into the lungs every 6 (six) hours as needed for wheezing or shortness of breath.     benzonatate  (TESSALON ) 100 MG capsule Take 1 capsule (100 mg total) by mouth every 8 (eight) hours. 21 capsule 0   Blood Glucose Monitoring Suppl (ACCU-CHEK GUIDE) w/Device KIT use as directed once daily 1 kit 0   Blood Glucose Monitoring Suppl (TRUE METRIX METER) w/Device KIT Use to check blood sugar 2 (two) times daily. 1 kit 0   budesonide -formoterol  (SYMBICORT ) 160-4.5 MCG/ACT inhaler Inhale 2 puffs into the lungs 2 (two) times daily. 10.2 g 3   Continuous Glucose Sensor (FREESTYLE LIBRE 3 SENSOR) MISC Place 1 sensor on the skin every 14 days. Use to check glucose continuously 3 each 12   doxycycline  (VIBRAMYCIN ) 100 MG capsule Take 1 capsule (100 mg total) by mouth 2 (two) times daily for 10 days. 20 capsule 0   fluticasone  (FLONASE ) 50 MCG/ACT nasal spray Place 1 spray  into both nostrils 2 (two) times daily. 16 g 3   gabapentin  (NEURONTIN ) 100 MG capsule Take 1 capsule (100 mg total) by mouth at bedtime. 90 capsule 3   glucose blood (ACCU-CHEK GUIDE) test strip use  2 (two) times daily as instructed 100 each 12   glucose blood (TRUE METRIX BLOOD GLUCOSE TEST) test strip Use as instructed 100 each 12   hydrocortisone  (ANUSOL -HC) 2.5 % rectal cream Place 1 Application rectally 2 (two) times daily. 30 g 1   ibuprofen  (ADVIL ) 800 MG tablet Take 1 tablet (800 mg total) by mouth every 6 (six) hours for mild pain/inflammation. 30 tablet 1   ipratropium-albuterol  (DUONEB) 0.5-2.5 (3) MG/3ML SOLN Take 3 mLs by nebulization every 6 (six) hours as needed. 120 mL 0   lipase/protease/amylase (CREON ) 36000 UNITS CPEP capsule Take 1 capsule (36,000 Units total) by mouth 3 (three) times daily with meals. May also take 1 capsule (36,000 Units total) as needed (with snacks - up to 4 snacks daily). 300 capsule 11   metFORMIN  (GLUCOPHAGE -XR) 500 MG 24 hr tablet Take 1 tablet (500 mg total) by mouth 2 (two) times daily with a meal. 180 tablet 0   montelukast  (SINGULAIR ) 10 MG tablet Take 1 tablet (10 mg total) by mouth daily. Needs office visit. 60 tablet 2   nystatin  (MYCOSTATIN ) 100000 UNIT/ML suspension Take 5 mLs (500,000 Units total) by mouth 4 (four) times daily. 120 mL 0   omeprazole  (PRILOSEC) 40 MG capsule Take 1 capsule (40 mg total) by mouth at bedtime. 60 capsule 2   predniSONE  (DELTASONE ) 20 MG tablet Take 3 tablets (60 mg total) by mouth daily. 15 tablet 0   risperiDONE  (RISPERDAL ) 3 MG tablet Take 1 tablet (3 mg total) by mouth daily. 30 tablet 3   solifenacin  (VESICARE ) 5 MG tablet Take 1 tablet (5 mg total) by mouth daily. 90 tablet 1   Tiotropium Bromide  Monohydrate (SPIRIVA  RESPIMAT) 2.5 MCG/ACT AERS Inhale 2 puffs into the lungs daily. 4 g 11   traZODone  (DESYREL ) 150 MG tablet Take 1 tablet (150 mg total) by mouth at bedtime as needed.  30 tablet 3   TRUEplus  Lancets 28G MISC Use to check blood sugar 2 (two) times daily. 100 each 3   No current facility-administered medications for this visit.     Musculoskeletal: Strength & Muscle Tone: within normal limits telehealth visit Gait & Station: normal telehealth visit Patient leans: N/A  Psychiatric Specialty Exam: Review of Systems  There were no vitals taken for this visit.There is no height or weight on file to calculate BMI.  General Appearance: Well Groomed  Eye Contact:  Good  Speech:  Clear and Coherent and Normal Rate  Volume:  Normal  Mood:  Euthymic  Affect:  Appropriate and Congruent  Thought Process:  Coherent, Goal Directed, and Linear  Orientation:  Full (Time, Place, and Person)  Thought Content: WDL   Suicidal Thoughts:  No  Homicidal Thoughts:  No  Memory:  Immediate;   Good Recent;   Good Remote;   Good  Judgement:  Good  Insight:  Good  Psychomotor Activity:  Normal  Concentration:  Concentration: Good and Attention Span: Good  Recall:  Good  Fund of Knowledge: Good  Language: Good  Akathisia:  No  Handed:  Left  AIMS (if indicated): not done  Assets:  Communication Skills Desire for Improvement Financial Resources/Insurance Housing Physical Health Social Support  ADL's:  Intact  Cognition: WNL  Sleep:  Good   Screenings: AIMS    Flowsheet Row Clinical Support from 01/11/2022 in [facility]  AIMS Total Score 0 0      AUDIT    Flowsheet Row Ad[facility] (Discharged[facility]  Alcohol Use Disorder Identification Test Final Score (AUDIT) 0      GAD-7    Flowsheet Row Video Visit from 04/11/2024 in [facility] Video Visit from 01/18/2024 in [facility] Video Visit from 11/02/2023 in [facility] Video  Visit from 08/10/2023 in [facility] Office Visit from 08/08/2023 in [facility] [place]  Total GAD-7 Score 8 6 6 5 2       PHQ2-9    Flowsheet Row Video Visit from 04/11/2024 in [facility] Video Visit from 01/18/2024 in [facility] Video Visit from 11/02/2023 in [facility] Video Visit from 08/10/2023 in [facility] Office Visit from 08/08/2023 in [facility] [place] [facility] [place]  PHQ-2 Total Score 1 0 2 0 1  PHQ-9 Total Score 3 1 10 6  --      Flowsheet Row ED from 11/16/2023 in [facility] Office Visit from 02/28/2023 in [facility] Counselor from 01/11/2022 in [facility]  C-SSRS RISK CATEGORY No Risk Low Risk High Risk        Assessment and Plan: Patient informed writer that her mood, anxiety, and depression are well-managed.  She does endorse fatigue and low motivation.  Patient was recently diagnosed with chronic pancreatitis. No medication changes made today.  Patient agreeable to continue medication as scribed.  Patient has not had routine psychiatric labs in a while.  She informed that she will attempt to have her labs drawn that were ordered at her last visit.  She  will also follow-up with her to discuss her  1. Anxiety state  Continue- gabapentin  (NEURONTIN ) 100 MG capsule; Take 1 capsule (100 mg total) by mouth at bedtime.  Dispense: 90 capsule; Refill: 3 Continue- risperiDONE  (RISPERDAL ) 3 MG tablet; Take 1 tablet (3 mg total) by mouth daily.  Dispense: 30 tablet; Refill: 3  2. Bipolar affective disorder, current episode mixed, without psychotic features (HCC)  Continue- gabapentin  (NEURONTIN ) 100 MG capsule; Take 1 capsule (100 mg total) by  mouth at bedtime.  Dispense: 90 capsule; Refill: 3 Continue- risperiDONE  (RISPERDAL ) 3 MG tablet; Take 1 tablet (3 mg total) by mouth daily.  Dispense: 30 tablet; Refill: 3  Collaboration of Care: Collaboration of Care: Other provider involved in patient's care AEB PCP  Patient/Guardian was advised Release of Information must be obtained prior to any record release in order to collaborate their care with an outside provider. Patient/Guardian was advised if they have not already done so to contact the registration department to sign all necessary forms in order for us  to release information regarding their care.   Consent: Patient/Guardian gives verbal consent for treatment and assignment of benefits for services provided during this visit. Patient/Guardian expressed understanding and agreed to proceed.   Follow-up in 2.5 months Arlyne Bering, NP 04/11/2024, 9:19 AM

## 2024-04-12 ENCOUNTER — Other Ambulatory Visit (HOSPITAL_COMMUNITY): Payer: Self-pay

## 2024-04-12 ENCOUNTER — Telehealth: Payer: Self-pay

## 2024-04-12 NOTE — Telephone Encounter (Signed)
 Pharmacy Patient Advocate Encounter   Received notification from Physician's Office that prior authorization for Creon  [id number] UNIT dr capsules is required/requested.   Insurance verification completed.   The patient is insured through [facility] .   Prior Authorization for Creon  [id number] UNIT dr capsules has been APPROVED from 04-12-2024 to 04-12-2025   PA #/Case ID/Reference #: [id number]

## 2024-04-12 NOTE — Telephone Encounter (Signed)
 My chart message sent to pt.

## 2024-04-13 ENCOUNTER — Other Ambulatory Visit (INDEPENDENT_AMBULATORY_CARE_PROVIDER_SITE_OTHER)

## 2024-04-13 ENCOUNTER — Other Ambulatory Visit (HOSPITAL_COMMUNITY): Payer: Self-pay

## 2024-04-13 ENCOUNTER — Telehealth: Payer: Self-pay | Admitting: Internal Medicine

## 2024-04-13 ENCOUNTER — Other Ambulatory Visit: Payer: Self-pay

## 2024-04-13 DIAGNOSIS — K861 Other chronic pancreatitis: Secondary | ICD-10-CM

## 2024-04-13 DIAGNOSIS — R1011 Right upper quadrant pain: Secondary | ICD-10-CM

## 2024-04-13 DIAGNOSIS — R194 Change in bowel habit: Secondary | ICD-10-CM | POA: Diagnosis not present

## 2024-04-13 DIAGNOSIS — R634 Abnormal weight loss: Secondary | ICD-10-CM | POA: Diagnosis not present

## 2024-04-13 LAB — LIPASE: Lipase: 62 U/L — ABNORMAL HIGH (ref 11.0–59.0)

## 2024-04-13 NOTE — Telephone Encounter (Signed)
 Inbound call from patient stating that she spoke with the pharmacy about 40 minutes ago and they advised her that the creon  was denied by insurance. I advised her there was a Mychart message sent yesterday by the nurse that it had been approved and she could pick it up from the pharmacy. She stated that the are still staying it was denied. Requesting a call to discuss. Please advise.

## 2024-04-13 NOTE — Telephone Encounter (Signed)
 Pt made aware that I spoke with pharmacy & they ran the prior auth again that was approved, and they will ship her medications to her once medication is back in stock. She is going to call pharmacy & request pick up instead of mail order. She had no further questions.
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
## 2024-04-14 ENCOUNTER — Other Ambulatory Visit: Payer: Self-pay

## 2024-04-16 ENCOUNTER — Ambulatory Visit: Payer: 59 | Attending: Internal Medicine | Admitting: Physical Therapy

## 2024-04-16 ENCOUNTER — Other Ambulatory Visit: Payer: Self-pay

## 2024-04-16 ENCOUNTER — Other Ambulatory Visit (HOSPITAL_COMMUNITY): Payer: Self-pay

## 2024-04-17 ENCOUNTER — Encounter: Payer: Self-pay | Admitting: Internal Medicine

## 2024-04-17 LAB — CANCER ANTIGEN 19-9: CA 19-9: 12 U/mL (ref <34)

## 2024-04-18 ENCOUNTER — Other Ambulatory Visit: Payer: Self-pay

## 2024-04-18 DIAGNOSIS — K8689 Other specified diseases of pancreas: Secondary | ICD-10-CM

## 2024-05-04 ENCOUNTER — Other Ambulatory Visit (HOSPITAL_COMMUNITY): Payer: Self-pay

## 2024-05-04 ENCOUNTER — Telehealth: Payer: Self-pay

## 2024-05-04 NOTE — Telephone Encounter (Signed)
 Reminder received  Chart reviewed and noted the following documentation:    " Pt request to see Dr. Rosaline Coma prior to May 31 for an office visit if possible. Pt to be scheduled for 05/07/2024 at 2:30 (7 day hold)"  Pt was contacted and stated that she has been working out of town and will not be able to make the appointment.  Pt verbalized understanding with all questions answered.

## 2024-05-05 DIAGNOSIS — Z885 Allergy status to narcotic agent status: Secondary | ICD-10-CM | POA: Diagnosis not present

## 2024-05-05 DIAGNOSIS — K219 Gastro-esophageal reflux disease without esophagitis: Secondary | ICD-10-CM | POA: Diagnosis not present

## 2024-05-05 DIAGNOSIS — J439 Emphysema, unspecified: Secondary | ICD-10-CM | POA: Diagnosis not present

## 2024-05-05 DIAGNOSIS — F319 Bipolar disorder, unspecified: Secondary | ICD-10-CM | POA: Diagnosis not present

## 2024-05-05 DIAGNOSIS — F17210 Nicotine dependence, cigarettes, uncomplicated: Secondary | ICD-10-CM | POA: Diagnosis not present

## 2024-05-05 DIAGNOSIS — K861 Other chronic pancreatitis: Secondary | ICD-10-CM | POA: Diagnosis not present

## 2024-05-05 DIAGNOSIS — F411 Generalized anxiety disorder: Secondary | ICD-10-CM | POA: Diagnosis not present

## 2024-05-05 DIAGNOSIS — M1990 Unspecified osteoarthritis, unspecified site: Secondary | ICD-10-CM | POA: Diagnosis not present

## 2024-05-05 DIAGNOSIS — I252 Old myocardial infarction: Secondary | ICD-10-CM | POA: Diagnosis not present

## 2024-05-18 ENCOUNTER — Other Ambulatory Visit (HOSPITAL_COMMUNITY): Payer: Self-pay

## 2024-05-30 ENCOUNTER — Telehealth (INDEPENDENT_AMBULATORY_CARE_PROVIDER_SITE_OTHER): Admitting: Psychiatry

## 2024-05-30 ENCOUNTER — Encounter: Payer: Self-pay | Admitting: Pharmacist

## 2024-05-30 ENCOUNTER — Other Ambulatory Visit: Payer: Self-pay

## 2024-05-30 ENCOUNTER — Encounter (HOSPITAL_COMMUNITY): Payer: Self-pay | Admitting: Psychiatry

## 2024-05-30 ENCOUNTER — Other Ambulatory Visit (HOSPITAL_COMMUNITY): Payer: Self-pay

## 2024-05-30 DIAGNOSIS — F411 Generalized anxiety disorder: Secondary | ICD-10-CM | POA: Diagnosis not present

## 2024-05-30 DIAGNOSIS — F3160 Bipolar disorder, current episode mixed, unspecified: Secondary | ICD-10-CM | POA: Diagnosis not present

## 2024-05-30 DIAGNOSIS — F17210 Nicotine dependence, cigarettes, uncomplicated: Secondary | ICD-10-CM

## 2024-05-30 DIAGNOSIS — F17200 Nicotine dependence, unspecified, uncomplicated: Secondary | ICD-10-CM

## 2024-05-30 MED ORDER — GABAPENTIN 100 MG PO CAPS
100.0000 mg | ORAL_CAPSULE | Freq: Every day | ORAL | 3 refills | Status: DC
  Filled 2024-05-30 – 2024-06-01 (×2): qty 30, 30d supply, fill #0
  Filled 2024-06-30: qty 30, 30d supply, fill #1
  Filled 2024-07-28: qty 30, 30d supply, fill #2

## 2024-05-30 MED ORDER — NICOTINE 21 MG/24HR TD PT24
21.0000 mg | MEDICATED_PATCH | Freq: Every day | TRANSDERMAL | 3 refills | Status: DC
  Filled 2024-05-30 – 2024-06-01 (×2): qty 28, 28d supply, fill #0

## 2024-05-30 MED ORDER — RISPERIDONE 0.5 MG PO TABS
0.5000 mg | ORAL_TABLET | Freq: Two times a day (BID) | ORAL | 3 refills | Status: DC | PRN
  Filled 2024-05-30 – 2024-06-01 (×2): qty 60, 30d supply, fill #0

## 2024-05-30 MED ORDER — RISPERIDONE 3 MG PO TABS
3.0000 mg | ORAL_TABLET | Freq: Every day | ORAL | 3 refills | Status: DC
  Filled 2024-05-30 – 2024-06-16 (×2): qty 30, 30d supply, fill #0
  Filled 2024-07-16 – 2024-08-13 (×2): qty 30, 30d supply, fill #1

## 2024-05-30 MED ORDER — TRAZODONE HCL 150 MG PO TABS
150.0000 mg | ORAL_TABLET | Freq: Every evening | ORAL | 3 refills | Status: DC | PRN
  Filled 2024-05-30 – 2024-06-01 (×2): qty 30, 30d supply, fill #0
  Filled 2024-06-30: qty 30, 30d supply, fill #1
  Filled 2024-07-28: qty 30, 30d supply, fill #2

## 2024-05-30 NOTE — Progress Notes (Signed)
 [facility] MD/PA/NP OP Progress Note Virtual Visit via Video Note  I connected with Candace Howard on 05/30/24 at  8:00 AM EDT by a video enabled telemedicine application and verified that I am speaking with the correct person using two identifiers.  Location: Patient: Work Provider: Clinic   I discussed the limitations of evaluation and management by telemedicine and the availability of in person appointments. The patient expressed understanding and agreed to proceed.  I provided 30 minutes of non-face-to-face time during this encounter.   05/30/2024 9:33 AM Candace Howard  MRN:  [id number]  Chief Complaint:My anxiety has been elevated and I have been coughing  HPI: 53 year old female seen today for follow-up psychiatric evaluation.  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  Currently she is being managed on Risperdal  3 mg nightly, gabapentin  100 mg 3 times daily (reports that she became addicted to higher doses), and trazodone  150 mg nightly as needed.  Today she reports her medications are somewhat effective in managing her psychiatric condition.  Today she was well-groomed, pleasant, cooperative, and engaged in conversation.  Patient informed Clinical research associate that she has been more anxious. She notes that she worries about her soon who relapsed on illegal substance.  Patient also notes that she gets overwhelmed when at work about her job performance.  She reports that she does well and has no complaints but does not feel confident in herself.  Recently patient reports that she has been coughing more at night. Provider asked patient if she is smoking before bed. She notes that she smokes an hour before bed.  Provider informed patient that smoking may increase inflammation in her lung and notes that when she lays down he could exacerbate her cough.  She asked writer if her medications could  cause it.  Provider informed patient that trazodone  may cause dry/irritated throat and Risperdal  may induce some coughing but notes that it is more likely her cigarette consumption.  Writer asked patient if she was interested in Nicorette  gum/patches and she reports that she was.   Patient reports that the above exacerbates her anxiety and depression.  Today provider conducted a GAD-7 and patient scored a 14, at her last visit she scored an 8.  Provider also conducted PHQ-9 and patient scored a 15, at her last visit she scored 3.  She informed Clinical research associate that her sleep fluctuates noting that she sleeps 5 to 6 hours nightly.  She does note that her increased cough keeps her up.  Recently patient reports being irritable, distracted, having racing thoughts, and notes that at times she impulsively purchases cigarettes and sodas.  Patient continues suffer from pancreatitis.  She was prescribed Creon  to help manage it and notes that her pancreas is doing somewhat better.  She denies any pain.  Patient informed that she continues to be sober from illegal substances for the last 2 years.   Patient informed Clinical research associate that her medication ran out of me.  Patient referred to Ms. Anselmo Bast for Medicaid assistance.  Patient informed Clinical research associate that she would like to utilize her two cats as support animals.  Provided instructed patient to register her animals of the state of [place] , provide documentation to the clinic, and informed her that writer would then write a letter.  She endorsed understanding and agreed.  Today patient agreeable to starting Risperdal  0.5 mg twice daily as needed to help manage anxiety and mood.  Provider informed patient that if it makes her sleepy that she can take a pill at night.  She endorsed understanding and agreed.  Patient also prescribed Nicorette  CQ 21 mg patches to help manage her tobacco dependence.Potential side effects of medication and risks vs benefits of treatment vs  non-treatment were explained and discussed. All questions were answered.  Patient referred to outpatient counseling for therapy.  No other concerns at this time.    Visit Diagnosis:    ICD-10-CM   1. Tobacco dependence  F17.200 nicotine  (NICODERM CQ ) 21 mg/24hr patch    2. Anxiety state  F41.1 gabapentin  (NEURONTIN ) 100 MG capsule    risperiDONE  (RISPERDAL ) 3 MG tablet    risperiDONE  (RISPERDAL ) 0.5 MG tablet    Ambulatory referral to Social Work    3. Bipolar affective disorder, current episode mixed, without psychotic features (HCC)  F31.60 gabapentin  (NEURONTIN ) 100 MG capsule    risperiDONE  (RISPERDAL ) 3 MG tablet    risperiDONE  (RISPERDAL ) 0.5 MG tablet    Ambulatory referral to Social Work          Past Psychiatric History: substance use (Alcohol, Tobacco, meth, and cocaine) Bipolar affective disorder, Depression, anxiety, and PTSD  Past Medical History:  Past Medical History:  Diagnosis Date   Anemia    Anxiety    Arthritis    Asthma    Chest pain    COPD (chronic obstructive pulmonary disease) (HCC)    Depression    Difficulty breathing    Frequent headaches    History of hepatitis C    IBS (irritable bowel syndrome)    Palpitations    Pancreatitis    Poor circulation     Past Surgical History:  Procedure Laterality Date   CARPAL TUNNEL RELEASE Bilateral    COLONOSCOPY     around age 23. [facility] Dr Monico Anna said they removed polyps   SKIN GRAFT Bilateral     Family Psychiatric History:  Son Substance use, maternal 4 uncles SI, mother abuse of pill use   Family History:  Family History  Problem Relation Age of Onset   Colon cancer Mother    Bladder Cancer Mother        initially and spreaded to the colon and all over per patient   COPD Mother    Glaucoma Mother    Irritable bowel syndrome Mother    Heart disease Father    Hypertension Father    Diabetes Father    Diabetes Paternal Grandmother     Social History:  Social History    Socioeconomic History   Marital status: Single    Spouse name: Not on file   Number of children: Not on file   Years of education: Not on file   Highest education level: Not on file  Occupational History   Not on file  Tobacco Use   Smoking status: Every Day    Current packs/day: 0.50    Average packs/day: 0.5 packs/day for 35.0 years (17.5 ttl pk-yrs)    Types: Cigarettes   Smokeless tobacco: Never   Tobacco comments:    Pt states she started back smoking 1 month ago and is smoking 1 ppd. ALS 03/31/23  Vaping Use   Vaping status: Never Used  Substance and Sexual Activity   Alcohol use: Not Currently    Comment: 2 months sober.   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   Sexual activity: Not Currently  Other Topics Concern  Not on file  Social History Narrative   Not on file   Social Drivers of Health   Financial Resource Strain: High Risk (01/11/2022)   Overall Financial Resource Strain (CARDIA)    Difficulty of Paying Living Expenses: Hard  Food Insecurity: Low Risk  (04/28/2023)   [facility]   Hunger Vital Sign    Worried About Running Out of Food in the Last Year: Never true    Ran Out of Food in the Last Year: Never true  Transportation Needs: Not on file (04/28/2023)  Physical Activity: Inactive (01/11/2022)   Exercise Vital Sign    Days of Exercise per Week: 0 days    Minutes of Exercise per Session: 0 min  Stress: Stress Concern Present (01/11/2022)   [employer] of Occupational Health - Occupational Stress Questionnaire    Feeling of Stress : Very much  Social Connections: Socially Isolated (01/11/2022)   Social Connection and Isolation Panel [NHANES]    Frequency of Communication with Friends and Family: More than three times a week    Frequency of Social Gatherings with Friends and Family: Three times a week    Attends Religious Services: Never    Active Member of Clubs or Organizations: No     Attends Banker Meetings: Never    Marital Status: Never married    Allergies:  Allergies  Allergen Reactions   Hydrocodone  Other (See Comments)    Other reaction(s): Unknown/See Comments Feel really high    Metabolic Disorder Labs: Lab Results  Component Value Date   HGBA1C 8.5 (A) 12/01/2023   MPG 108.28 12/25/2021   No results found for: PROLACTIN Lab Results  Component Value Date   CHOL 182 12/27/2021   TRIG 114 12/27/2021   HDL 49 12/27/2021   CHOLHDL 3.7 12/27/2021   VLDL 23 12/27/2021   LDLCALC 110 (H) 12/27/2021   LDLCALC 130 (H) 12/25/2021   Lab Results  Component Value Date   TSH 0.636 12/25/2021    Therapeutic Level Labs: No results found for: LITHIUM No results found for: VALPROATE No results found for: CBMZ  Current Medications: Current Outpatient Medications  Medication Sig Dispense Refill   nicotine  (NICODERM CQ ) 21 mg/24hr patch Place 1 patch (21 mg total) onto the skin daily. 28 patch 3   risperiDONE  (RISPERDAL ) 0.5 MG tablet Take 1 tablet (0.5 mg total) by mouth 2 (two) times daily as needed. 60 tablet 3   Accu-Chek Softclix Lancets lancets Use as instructed 100 each 12   albuterol  (VENTOLIN  HFA) 108 (90 Base) MCG/ACT inhaler Inhale 2 puffs into the lungs every 6 (six) hours as needed for wheezing or shortness of breath.     benzonatate  (TESSALON ) 100 MG capsule Take 1 capsule (100 mg total) by mouth every 8 (eight) hours. 21 capsule 0   Blood Glucose Monitoring Suppl (ACCU-CHEK GUIDE) w/Device KIT use as directed once daily 1 kit 0   Blood Glucose Monitoring Suppl (TRUE METRIX METER) w/Device KIT Use to check blood sugar 2 (two) times daily. 1 kit 0   budesonide -formoterol  (SYMBICORT ) 160-4.5 MCG/ACT inhaler Inhale 2 puffs into the lungs 2 (two) times daily. 10.2 g 3   Continuous Glucose Sensor (FREESTYLE LIBRE 3 SENSOR) MISC Place 1 sensor on the skin every 14 days. Use to check glucose continuously 3 each 12    doxycycline  (VIBRAMYCIN ) 100 MG capsule Take 1 capsule (100 mg total) by mouth 2 (two) times daily for 10 days. 20 capsule 0  fluticasone  (FLONASE ) 50 MCG/ACT nasal spray Place 1 spray into both nostrils 2 (two) times daily. 16 g 3   gabapentin  (NEURONTIN ) 100 MG capsule Take 1 capsule (100 mg total) by mouth at bedtime. 90 capsule 3   glucose blood (ACCU-CHEK GUIDE) test strip use  2 (two) times daily as instructed 100 each 12   glucose blood (TRUE METRIX BLOOD GLUCOSE TEST) test strip Use as instructed 100 each 12   hydrocortisone  (ANUSOL -HC) 2.5 % rectal cream Place 1 Application rectally 2 (two) times daily. 30 g 1   ibuprofen  (ADVIL ) 800 MG tablet Take 1 tablet (800 mg total) by mouth every 6 (six) hours for mild pain/inflammation. 30 tablet 1   ipratropium-albuterol  (DUONEB) 0.5-2.5 (3) MG/3ML SOLN Take 3 mLs by nebulization every 6 (six) hours as needed. 120 mL 0   lipase/protease/amylase (CREON ) 36000 UNITS CPEP capsule Take 1 capsule (36,000 Units total) by mouth 3 (three) times daily with meals. May also take 1 capsule (36,000 Units total) as needed (with snacks - up to 4 snacks daily). 300 capsule 11   metFORMIN  (GLUCOPHAGE -XR) 500 MG 24 hr tablet Take 1 tablet (500 mg total) by mouth 2 (two) times daily with a meal. 180 tablet 0   montelukast  (SINGULAIR ) 10 MG tablet Take 1 tablet (10 mg total) by mouth daily. Needs office visit. 60 tablet 2   nystatin  (MYCOSTATIN ) 100000 UNIT/ML suspension Take 5 mLs (500,000 Units total) by mouth 4 (four) times daily. 120 mL 0   omeprazole  (PRILOSEC) 40 MG capsule Take 1 capsule (40 mg total) by mouth at bedtime. 60 capsule 2   predniSONE  (DELTASONE ) 20 MG tablet Take 3 tablets (60 mg total) by mouth daily. 15 tablet 0   risperiDONE  (RISPERDAL ) 3 MG tablet Take 1 tablet (3 mg total) by mouth daily. 30 tablet 3   solifenacin  (VESICARE ) 5 MG tablet Take 1 tablet (5 mg total) by mouth daily. 90 tablet 1   Tiotropium Bromide  Monohydrate (SPIRIVA  RESPIMAT)  2.5 MCG/ACT AERS Inhale 2 puffs into the lungs daily. 4 g 11   traZODone  (DESYREL ) 150 MG tablet Take 1 tablet (150 mg total) by mouth at bedtime as needed. 30 tablet 3   TRUEplus Lancets 28G MISC Use to check blood sugar 2 (two) times daily. 100 each 3   No current facility-administered medications for this visit.     Musculoskeletal: Strength & Muscle Tone: within normal limits telehealth visit Gait & Station: normal telehealth visit Patient leans: N/A  Psychiatric Specialty Exam: Review of Systems  There were no vitals taken for this visit.There is no height or weight on file to calculate BMI.  General Appearance: Well Groomed  Eye Contact:  Good  Speech:  Clear and Coherent and Normal Rate  Volume:  Normal  Mood:  Anxious and Depressed  Affect:  Appropriate and Congruent  Thought Process:  Coherent, Goal Directed, and Linear  Orientation:  Full (Time, Place, and Person)  Thought Content: WDL   Suicidal Thoughts:  Yes.  without intent/plan  Homicidal Thoughts:  No  Memory:  Immediate;   Good Recent;   Good Remote;   Good  Judgement:  Good  Insight:  Good  Psychomotor Activity:  Normal  Concentration:  Concentration: Good and Attention Span: Good  Recall:  Good  Fund of Knowledge: Good  Language: Good  Akathisia:  No  Handed:  Left  AIMS (if indicated): not done  Assets:  Communication Skills Desire for Improvement Financial Resources/Insurance Housing Physical Health Social Support  ADL's:  Intact  Cognition: WNL  Sleep:  Fair   Screenings: AIMS    Flowsheet Row Clinical Support from 01/11/2022 in [facility]) [facility]  AIMS Total Score 0 0      AUDIT    Flowsheet Row Ad[facility] (Discharged[facility]  Alcohol Use Disorder Identification Test Final Score (AUDIT) 0      GAD-7    Flowsheet Row  Video Visit from 05/30/2024 in [facility] Video Visit from 04/11/2024 in [facility] Video Visit from 01/18/2024 in [facility] Video Visit from 11/02/2023 in [facility] Video Visit from 08/10/2023 in [facility]  Total GAD-7 Score 14 8 6 6 5       PHQ2-9    Flowsheet Row Video Visit from 05/30/2024 in [facility] Video Visit from 04/11/2024 in [facility] Video Visit from 01/18/2024 in [facility] Video Visit from 11/02/2023 in [facility] Video Visit from 08/10/2023 in [facility]  PHQ-2 Total Score 3 1 0 2 0  PHQ-9 Total Score 15 3 1 10 6       Flowsheet Row Video Visit from 05/30/2024 in [facility] ED from 11/16/2023 in [facility] Emergency Department at [facility] Office Visit from 02/28/2023 in [facility]  C-SSRS RISK CATEGORY Error: Q7 should not be populated when Q6 is No No Risk Low Risk        Assessment and Plan: Patient informed writer that her mood, anxiety, and depression has increased. She also notes that she has poor sleep. Today patient agreeable to starting Risperdal  0.5 mg twice daily as needed to help manage anxiety and mood.  Provider informed patient that if it makes her sleepy that she can take a pill at night.  She endorsed understanding and agreed.  Patient also prescribed Nicorette  CQ 21 mg patches to help manage her tobacco dependence.Potential side effects of medication and risks vs benefits of treatment vs non-treatment were explained and discussed. All questions were answered.  Patient referred to outpatient counseling for therapy.  No other concerns at this time.    1. Anxiety state  Continue- gabapentin  (NEURONTIN )  100 MG capsule; Take 1 capsule (100 mg total) by mouth at bedtime.  Dispense: 90 capsule; Refill: 3 Continue- risperiDONE  (RISPERDAL ) 3 MG tablet; Take 1 tablet (3 mg total) by mouth daily.  Dispense: 30 tablet; Refill: 3 Start- risperiDONE  (RISPERDAL ) 0.5 MG tablet; Take 1 tablet (0.5 mg total) by mouth 2 (two) times daily as needed.  Dispense: 60 tablet; Refill: 3 - Ambulatory referral to Social Work  2. Bipolar affective disorder, current episode mixed, without psychotic features (HCC)  Continue- gabapentin  (NEURONTIN ) 100 MG capsule; Take 1 capsule (100 mg total) by mouth at bedtime.  Dispense: 90 capsule; Refill: 3 Continue- risperiDONE  (RISPERDAL ) 3 MG tablet; Take 1 tablet (3 mg total) by mouth daily.  Dispense: 30 tablet; Refill: 3 Start- risperiDONE  (RISPERDAL ) 0.5 MG tablet; Take 1 tablet (0.5 mg total) by mouth 2 (two) times daily as needed.  Dispense: 60 tablet; Refill: 3 - Ambulatory referral to Social Work  3. Tobacco dependence (Primary)  Start- nicotine  (NICODERM CQ ) 21 mg/24hr patch; Place 1 patch (21 mg total) onto the skin daily.  Dispense: 28 patch; Refill: 3    Collaboration of Care: Collaboration of Care: Other provider involved in patient's care AEB PCP  Patient/Guardian was advised Release of Information must be obtained prior to any record release in order to collaborate their care with an outside provider. Patient/Guardian was advised if they have not already done so to contact the registration department to sign all necessary forms in order for us  to release information regarding their care.   Consent: Patient/Guardian gives verbal consent for treatment and assignment of benefits for services provided during this visit. Patient/Guardian expressed understanding and agreed to proceed.   Follow-up in 2.5 months Arlyne Bering, NP 05/30/2024, 9:33 AM

## 2024-06-01 ENCOUNTER — Other Ambulatory Visit: Payer: Self-pay

## 2024-06-01 ENCOUNTER — Other Ambulatory Visit (HOSPITAL_COMMUNITY): Payer: Self-pay

## 2024-06-02 ENCOUNTER — Other Ambulatory Visit (HOSPITAL_COMMUNITY): Payer: Self-pay

## 2024-06-16 ENCOUNTER — Other Ambulatory Visit (HOSPITAL_COMMUNITY): Payer: Self-pay

## 2024-06-18 ENCOUNTER — Encounter (HOSPITAL_COMMUNITY): Payer: Self-pay | Admitting: Gastroenterology

## 2024-06-18 ENCOUNTER — Telehealth: Payer: Self-pay

## 2024-06-18 NOTE — Progress Notes (Signed)
 Attempted to obtain medical history for pre op call via telephone, unable to reach at this time. HIPAA compliant voicemail message left requesting return call to pre surgical testing department.

## 2024-06-18 NOTE — Telephone Encounter (Signed)
 Procedure ULTRASOUND Procedure date: 06/25/24 Procedure location: wl Arrival Time: 745 am Spoke with the patient Y/N: Pt phone rang busy @4 :32pm 6/30. I will call back tomorrow. Any prep concerns? .  Has the patient obtained the prep from the pharmacy ? [name] Do you have a care partner and transportation: . Any additional concerns? Candace Howard

## 2024-06-19 ENCOUNTER — Ambulatory Visit: Admitting: Internal Medicine

## 2024-06-24 NOTE — Anesthesia Preprocedure Evaluation (Signed)
 Anesthesia Evaluation  Patient identified by MRN, date of birth, ID band Patient awake    Reviewed: Allergy & Precautions, NPO status , Patient's Chart, lab work & pertinent test results  History of Anesthesia Complications Negative for: history of anesthetic complications  Airway Mallampati: II  TM Distance: >3 FB Neck ROM: Full    Dental  (+) Dental Advisory Given, Upper Dentures, Missing   Pulmonary asthma , COPD,  COPD inhaler, Current SmokerPatient did not abstain from smoking.    + decreased breath sounds      Cardiovascular + Peripheral Vascular Disease  Normal cardiovascular exam     Neuro/Psych  Headaches PSYCHIATRIC DISORDERS Anxiety Depression Bipolar Disorder    Neuromuscular disease    GI/Hepatic ,,,(+)     substance abuse  cocaine use, Hepatitis -, C IBS Pancreatitis    Endo/Other  diabetes, Type 2    Renal/GU negative Renal ROS     Musculoskeletal  (+) Arthritis ,    Abdominal   Peds  Hematology negative hematology ROS (+)   Anesthesia Other Findings Vocal cord nodules  Reproductive/Obstetrics                              Anesthesia Physical Anesthesia Plan  ASA: 3  Anesthesia Plan: MAC   Post-op Pain Management: Minimal or no pain anticipated   Induction:   PONV Risk Score and Plan: 1 and Propofol  infusion and Treatment may vary due to age or medical condition  Airway Management Planned: Nasal Cannula and Natural Airway  Additional Equipment: None  Intra-op Plan:   Post-operative Plan:   Informed Consent: I have reviewed the patients History and Physical, chart, labs and discussed the procedure including the risks, benefits and alternatives for the proposed anesthesia with the patient or authorized representative who has indicated his/her understanding and acceptance.       Plan Discussed with: CRNA and Anesthesiologist  Anesthesia Plan Comments:           Anesthesia Quick Evaluation

## 2024-06-25 ENCOUNTER — Other Ambulatory Visit (HOSPITAL_COMMUNITY): Payer: Self-pay

## 2024-06-25 ENCOUNTER — Other Ambulatory Visit: Payer: Self-pay

## 2024-06-25 ENCOUNTER — Encounter (HOSPITAL_COMMUNITY): Payer: Self-pay | Admitting: Anesthesiology

## 2024-06-25 ENCOUNTER — Encounter (HOSPITAL_COMMUNITY)
Admission: RE | Disposition: A | Discharge status: Home / Self Care | Payer: Self-pay | Source: Home / Self Care | Attending: Gastroenterology

## 2024-06-25 ENCOUNTER — Ambulatory Visit (HOSPITAL_COMMUNITY)
Admission: RE | Admit: 2024-06-25 | Discharge: 2024-06-25 | Disposition: A | Discharge status: Home / Self Care | Attending: Gastroenterology | Admitting: Gastroenterology

## 2024-06-25 ENCOUNTER — Encounter (HOSPITAL_BASED_OUTPATIENT_CLINIC_OR_DEPARTMENT_OTHER): Payer: Self-pay | Admitting: Anesthesiology

## 2024-06-25 ENCOUNTER — Encounter (HOSPITAL_COMMUNITY): Payer: Self-pay | Admitting: Gastroenterology

## 2024-06-25 DIAGNOSIS — F17210 Nicotine dependence, cigarettes, uncomplicated: Secondary | ICD-10-CM | POA: Diagnosis not present

## 2024-06-25 DIAGNOSIS — K861 Other chronic pancreatitis: Secondary | ICD-10-CM

## 2024-06-25 DIAGNOSIS — J4489 Other specified chronic obstructive pulmonary disease: Secondary | ICD-10-CM | POA: Diagnosis not present

## 2024-06-25 DIAGNOSIS — K589 Irritable bowel syndrome without diarrhea: Secondary | ICD-10-CM | POA: Diagnosis not present

## 2024-06-25 DIAGNOSIS — F419 Anxiety disorder, unspecified: Secondary | ICD-10-CM | POA: Insufficient documentation

## 2024-06-25 DIAGNOSIS — M1990 Unspecified osteoarthritis, unspecified site: Secondary | ICD-10-CM | POA: Insufficient documentation

## 2024-06-25 DIAGNOSIS — K3189 Other diseases of stomach and duodenum: Secondary | ICD-10-CM | POA: Diagnosis not present

## 2024-06-25 DIAGNOSIS — R1011 Right upper quadrant pain: Secondary | ICD-10-CM | POA: Diagnosis not present

## 2024-06-25 DIAGNOSIS — K828 Other specified diseases of gallbladder: Secondary | ICD-10-CM | POA: Diagnosis not present

## 2024-06-25 DIAGNOSIS — F418 Other specified anxiety disorders: Secondary | ICD-10-CM

## 2024-06-25 DIAGNOSIS — K449 Diaphragmatic hernia without obstruction or gangrene: Secondary | ICD-10-CM | POA: Diagnosis not present

## 2024-06-25 DIAGNOSIS — K2970 Gastritis, unspecified, without bleeding: Secondary | ICD-10-CM

## 2024-06-25 DIAGNOSIS — R1013 Epigastric pain: Secondary | ICD-10-CM | POA: Diagnosis not present

## 2024-06-25 DIAGNOSIS — K2289 Other specified disease of esophagus: Secondary | ICD-10-CM

## 2024-06-25 DIAGNOSIS — K8689 Other specified diseases of pancreas: Secondary | ICD-10-CM

## 2024-06-25 DIAGNOSIS — Z833 Family history of diabetes mellitus: Secondary | ICD-10-CM | POA: Diagnosis not present

## 2024-06-25 DIAGNOSIS — J449 Chronic obstructive pulmonary disease, unspecified: Secondary | ICD-10-CM | POA: Diagnosis not present

## 2024-06-25 DIAGNOSIS — K209 Esophagitis, unspecified without bleeding: Secondary | ICD-10-CM | POA: Diagnosis not present

## 2024-06-25 DIAGNOSIS — I899 Noninfective disorder of lymphatic vessels and lymph nodes, unspecified: Secondary | ICD-10-CM

## 2024-06-25 DIAGNOSIS — E1151 Type 2 diabetes mellitus with diabetic peripheral angiopathy without gangrene: Secondary | ICD-10-CM | POA: Diagnosis not present

## 2024-06-25 DIAGNOSIS — F319 Bipolar disorder, unspecified: Secondary | ICD-10-CM | POA: Diagnosis not present

## 2024-06-25 HISTORY — PX: ESOPHAGOGASTRODUODENOSCOPY: SHX5428

## 2024-06-25 HISTORY — PX: EUS: SHX5427

## 2024-06-25 SURGERY — ULTRASOUND, UPPER GI TRACT, ENDOSCOPIC
Anesthesia: Monitor Anesthesia Care

## 2024-06-25 MED ORDER — IPRATROPIUM-ALBUTEROL 0.5-2.5 (3) MG/3ML IN SOLN
RESPIRATORY_TRACT | Status: AC
  Filled 2024-06-25: qty 3

## 2024-06-25 MED ORDER — PANCRELIPASE (LIP-PROT-AMYL) 36000-114000 UNITS PO CPEP: ORAL_CAPSULE | ORAL | Status: AC

## 2024-06-25 MED ORDER — IPRATROPIUM-ALBUTEROL 0.5-2.5 (3) MG/3ML IN SOLN
3.0000 mL | Freq: Once | RESPIRATORY_TRACT | Status: AC
  Administered 2024-06-25: 3 mL via RESPIRATORY_TRACT

## 2024-06-25 MED ORDER — PROPOFOL 500 MG/50ML IV EMUL
INTRAVENOUS | Status: DC | PRN
  Administered 2024-06-25: 150 ug/kg/min via INTRAVENOUS

## 2024-06-25 MED ORDER — PROPOFOL 10 MG/ML IV BOLUS
INTRAVENOUS | Status: DC | PRN
  Administered 2024-06-25: 40 mg via INTRAVENOUS

## 2024-06-25 MED ORDER — GLYCOPYRROLATE PF 0.2 MG/ML IJ SOSY
PREFILLED_SYRINGE | INTRAMUSCULAR | Status: DC | PRN
  Administered 2024-06-25: .2 mg via INTRAVENOUS

## 2024-06-25 MED ORDER — DEXMEDETOMIDINE HCL IN NACL 200 MCG/50ML IV SOLN
INTRAVENOUS | Status: DC | PRN
  Administered 2024-06-25: 6 ug via INTRAVENOUS
  Administered 2024-06-25: 4 ug via INTRAVENOUS

## 2024-06-25 MED ORDER — PROPOFOL 1000 MG/100ML IV EMUL
INTRAVENOUS | Status: AC
  Filled 2024-06-25: qty 100

## 2024-06-25 MED ORDER — SODIUM CHLORIDE 0.9 % IV SOLN: INTRAVENOUS | Status: DC

## 2024-06-25 MED ORDER — PANTOPRAZOLE SODIUM 40 MG PO TBEC
40.0000 mg | DELAYED_RELEASE_TABLET | Freq: Every day | ORAL | 6 refills | Status: DC
Start: 2024-06-25 — End: 2024-11-13
  Filled 2024-06-25: qty 30, 30d supply, fill #0

## 2024-06-25 NOTE — Op Note (Addendum)
 [facility] Patient Name: Candace Howard Procedure Date: 06/25/2024 MRN: [id number] Attending MD: Aloha Finner , MD, [id number] Date of Birth: 1971-08-22 CSN: [id number] Age: 53 Admit Type: Ambulatory Procedure:                Upper EUS Indications:              Dilated pancreatic duct on CT scan, Epigastric                            abdominal pain, Abdominal pain in the right upper                            quadrant Providers:                Aloha Finner, MD, Hoy Penner, RN, [place]y [place], Technician Referring MD:             Rosario Estefana Kidney Medicines:                Monitored Anesthesia Care Complications:            No immediate complications. Estimated Blood Loss:     Estimated blood loss was minimal. Procedure:                Pre-Anesthesia Assessment:                           - Prior to the procedure, a History and Physical                            was performed, and patient medications and                            allergies were reviewed. The patient's tolerance of                            previous anesthesia was also reviewed. The risks                            and benefits of the procedure and the sedation                            options and risks were discussed with the patient.                            All questions were answered, and informed consent                            was obtained. Prior Anticoagulants: The patient has                            taken no anticoagulant or antiplatelet agents. ASA  Grade Assessment: II - A patient with mild systemic                            disease. After reviewing the risks and benefits,                            the patient was deemed in satisfactory condition to                            undergo the procedure.                           After obtaining informed consent, the endoscope was                             passed under direct vision. Throughout the                            procedure, the patient's blood pressure, pulse, and                            oxygen saturations were monitored continuously. The                            [id number]-[id number] ([id number]) Olympus endoscope was introduced                            through the mouth, and advanced to the second part                            of duodenum. The [id number]-[id number] ([id number]) Olympus                            duodenoscope was introduced through the mouth, and                            advanced to the area of papilla. The [id number]                            ([id number]) Olympus linear ultrasound scope was                            introduced through the mouth, and advanced to the                            duodenum for ultrasound examination from the                            stomach and duodenum. The upper EUS was                            accomplished without difficulty. The patient  tolerated the procedure. Scope In: Scope Out: Findings:      ENDOSCOPIC FINDING: :      No gross lesions were noted in the proximal esophagus and in the mid       esophagus.      LA Grade A (one or more mucosal breaks less than 5 mm, not extending       between tops of 2 mucosal folds) esophagitis with no bleeding was found       in the distal esophagus.      The Z-line was irregular and was found 35 cm from the incisors.      A 3 cm hiatal hernia was present.      Patchy mildly erythematous mucosa without bleeding was found in the       entire examined stomach. Biopsies were taken with a cold forceps for       histology and Helicobacter pylori testing.      No gross lesions were noted in the duodenal bulb, in the first portion       of the duodenum and in the second portion of the duodenum.      The major papilla was normal and hidden under a hood.      ENDOSONOGRAPHIC FINDING: :      Endosonographic imaging of the pancreas showed  sonographic changes       indicative of moderate-severe chronic pancreatitis in the entire       pancreas. The parenchyma had hyperechoic foci with shadowing, lobularity       without honeycombing and hyperechoic strands. The pancreatic duct had       intraductal calculi, irregularity of contour, duct dilation and a       hyperechoic duct margin (PDH = 2.2 mm, PDB = 9.0 mm, PDT = 7.1 mm).      There was no sign of significant endosonographic abnormality in the       common bile duct and in the common hepatic duct.      A small amount of hyperechoic material consistent with sludge was       visualized endosonographically in the gallbladder. No evidence of any       cholelithiasis.      Endosonographic imaging of the ampulla showed no intramural       (subepithelial) lesion.      Endosonographic imaging in the visualized portion of the liver showed no       mass.      No malignant-appearing lymph nodes were visualized in the celiac region       (level 20), peripancreatic region and porta hepatis region.      The celiac region was visualized. Impression:               EGD impression:                           - No gross lesions in the proximal esophagus and in                            the mid esophagus.                           - LA Grade A esophagitis with no bleeding found  distally.                           - Z-line irregular, 35 cm from the incisors.                           - 3 cm hiatal hernia.                           - Erythematous mucosa in the stomach. Biopsied.                           - No gross lesions in the duodenal bulb, in the                            first portion of the duodenum and in the second                            portion of the duodenum.                           - Normal major papilla (hidden under a hood).                           EUS impression:                           - Endosonographic imaging of the pancreas showed                             sonographic changes consistent with moderate-severe                            chronic pancreatitis. Pancreatic ductal dilation                            noted upstream of PD stone. Important to note                            decreased sensitivity in patient to have                            significant chronic pancreatitis changes of finding                            masses/lesions.                           - There was no sign of significant pathology in the                            common bile duct and in the common hepatic duct.                           - Hyperechoic material consistent with sludge was  visualized endosonographically in the gallbladder.                            No evidence of cholelithiasis.                           - No malignant-appearing lymph nodes were                            visualized in the celiac region (level 20),                            peripancreatic region and porta hepatis region. Moderate Sedation:      Not Applicable - Patient had care per Anesthesia. Recommendation:           - The patient will be observed post-procedure,                            until all discharge criteria are met.                           - Discharge patient to home.                           - Patient has a contact number available for                            emergencies. The signs and symptoms of potential                            delayed complications were discussed with the                            patient. Return to normal activities tomorrow.                            Written discharge instructions were provided to the                            patient.                           - Resume previous diet.                           - Observe patient's clinical course.                           - Increase Creon  to 72,000 units with each meal and                            36,000 units with each snack.                            - Initiate pantoprazole  40 mg once daily, for  healing of esophagitis and gastritis.                           - Continue present medications.                           - Follow-up pathology.                           - Can discussed with patient in future,                            consideration of pancreatic ERCP and attempt of                            trying to traverse pancreas ductal stone to see if                            improving drainage may improve pain/discomfort.                            Ability to remove this stone completely is unlikely                            and typical retrograde ERCP. As PD is dilating,                            consideration of whether a [place] procedure may be                            worth considering, if it is felt that patient's                            pain is pancreatitis driven could be considered.                            Evaluation in future for celiac block could be had.                           - Recommend MRI/MRCP in 4 to 6 months for                            monitoring of PD dilation (will forward this to                            patient's primary gastroenterologist).                           - The findings and recommendations were discussed                            with the patient.                           - The  findings and recommendations were discussed                            with the designated responsible adult. Procedure Code(s):        --- Professional ---                           [phone], Esophagogastroduodenoscopy, flexible,                            transoral; with endoscopic ultrasound examination                            limited to the esophagus, stomach or duodenum, and                            adjacent structures                           43239, Esophagogastroduodenoscopy, flexible,                            transoral; with biopsy, single  or multiple Diagnosis Code(s):        --- Professional ---                           K20.90, Esophagitis, unspecified without bleeding                           K22.89, Other specified disease of esophagus                           K44.9, Diaphragmatic hernia without obstruction or                            gangrene                           K31.89, Other diseases of stomach and duodenum                           I89.9, Noninfective disorder of lymphatic vessels                            and lymph nodes, unspecified                           K86.89, Other specified diseases of pancreas                           R10.13, Epigastric pain                           R10.11, Right upper quadrant pain                           R93.3, Abnormal findings on diagnostic imaging of  other parts of digestive tract                           K83.8, Other specified diseases of biliary tract CPT copyright 2022 American [employer] Association. All rights reserved. The codes documented in this report are preliminary and upon coder review may  be revised to meet current compliance requirements. Aloha Finner, MD 06/25/2024 10:07:37 AM Number of Addenda: 0

## 2024-06-25 NOTE — Anesthesia Postprocedure Evaluation (Signed)
 Anesthesia Post Note  Patient: Candace Howard  Procedure(s) Performed: ULTRASOUND, UPPER GI TRACT, ENDOSCOPIC EGD (ESOPHAGOGASTRODUODENOSCOPY)     Patient location during evaluation: PACU Anesthesia Type: MAC Level of consciousness: awake and alert Pain management: pain level controlled Vital Signs Assessment: post-procedure vital signs reviewed and stable Respiratory status: spontaneous breathing, nonlabored ventilation and respiratory function stable Cardiovascular status: stable and blood pressure returned to baseline Anesthetic complications: no   No notable events documented.  Last Vitals:  Vitals:   06/25/24 1020 06/25/24 1030  BP: 98/61 101/60  Pulse: 90 93  Resp: (!) 27 19  Temp:    SpO2: 95% 96%    Last Pain:  Vitals:   06/25/24 1030  TempSrc:   PainSc: 0-No pain                 Debby FORBES Like

## 2024-06-25 NOTE — H&P (Addendum)
 GASTROENTEROLOGY PROCEDURE H&P NOTE   Primary Care Physician: Delbert Clam, MD  HPI: Candace Howard is a 53 y.o. female who presents for EGD/EUS to evaluate PD dilation.  Past Medical History:  Diagnosis Date   Anemia    Anxiety    Arthritis    Asthma    Chest pain    COPD (chronic obstructive pulmonary disease) (HCC)    Depression    Difficulty breathing    Frequent headaches    History of hepatitis C    IBS (irritable bowel syndrome)    Palpitations    Pancreatitis    Poor circulation    Past Surgical History:  Procedure Laterality Date   CARPAL TUNNEL RELEASE Bilateral    COLONOSCOPY     around age 30. [facility] said they removed polyps   SKIN GRAFT Bilateral    Current Facility-Administered Medications  Medication Dose Route Frequency Provider Last Rate Last Admin   0.9 %  sodium chloride  infusion   Intravenous Continuous Mansouraty, Aloha Raddle., MD        Current Facility-Administered Medications:    0.9 %  sodium chloride  infusion, , Intravenous, Continuous, Mansouraty, Aloha Raddle., MD Allergies  Allergen Reactions   Hydrocodone  Other (See Comments)    Other reaction(s): Unknown/See Comments Feel really high   Family History  Problem Relation Age of Onset   Colon cancer Mother    Bladder Cancer Mother        initially and spreaded to the colon and all over per patient   COPD Mother    Glaucoma Mother    Irritable bowel syndrome Mother    Heart disease Father    Hypertension Father    Diabetes Father    Diabetes Paternal Grandmother    Social History   Socioeconomic History   Marital status: Single    Spouse name: Not on file   Number of children: Not on file   Years of education: Not on file   Highest education level: Not on file  Occupational History   Not on file  Tobacco Use   Smoking status: Every Day    Current packs/day: 0.50    Average packs/day: 0.5 packs/day for 35.0 years (17.5 ttl pk-yrs)    Types:  Cigarettes   Smokeless tobacco: Never   Tobacco comments:    Pt states she started back smoking 1 month ago and is smoking 1 ppd. ALS 03/31/23  Vaping Use   Vaping status: Never Used  Substance and Sexual Activity   Alcohol use: Not Currently    Comment: 2 months sober.   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   Sexual activity: Not Currently  Other Topics Concern   Not on file  Social History Narrative   Not on file   Social Drivers of Health   Financial Resource Strain: High Risk (01/11/2022)   Overall Financial Resource Strain (CARDIA)    Difficulty of Paying Living Expenses: Hard  Food Insecurity: Low Risk  (04/28/2023)   Received from [facility]   Hunger Vital Sign    Within the past 12 months, you worried that your food would run out before you got money to buy more: Never true    Within the past 12 months, the food you bought just didn't last and you didn't have money to get more. : Never true  Transportation Needs: Not on file (04/28/2023)  Physical Activity: Inactive (01/11/2022)   Exercise Vital  Sign    Days of Exercise per Week: 0 days    Minutes of Exercise per Session: 0 min  Stress: Stress Concern Present (01/11/2022)   [employer] of Occupational Health - Occupational Stress Questionnaire    Feeling of Stress : Very much  Social Connections: Socially Isolated (01/11/2022)   Social Connection and Isolation Panel    Frequency of Communication with Friends and Family: More than three times a week    Frequency of Social Gatherings with Friends and Family: Three times a week    Attends Religious Services: Never    Active Member of Clubs or Organizations: No    Attends Banker Meetings: Never    Marital Status: Never married  Intimate Partner Violence: Not At Risk (01/11/2022)   Humiliation, Afraid, Rape, and Kick questionnaire    Fear of Current or Ex-Partner: No    Emotionally Abused: No    Physically Abused: No     Sexually Abused: No    Physical Exam: Today's Vitals   06/21/24 1112  Weight: 64.9 kg   Body mass index is 24.55 kg/m. GEN: NAD EYE: Sclerae anicteric ENT: MMM CV: Non-tachycardic GI: Soft, 2-3/10 pain coming/going in the MEG/RUQ region preprocedure (occuring more frequently overnight)  NEURO:  Alert & Oriented x 3  Lab Results: No results for input(s): WBC, HGB, HCT, PLT in the last 72 hours. BMET No results for input(s): NA, K, CL, CO2, GLUCOSE, BUN, CREATININE, CALCIUM  in the last 72 hours. LFT No results for input(s): PROT, ALBUMIN, AST, ALT, ALKPHOS, BILITOT, BILIDIR, IBILI in the last 72 hours. PT/INR No results for input(s): LABPROT, INR in the last 72 hours.   Impression / Plan: This is a 53 y.o.female who presents for EGD/EUS to evaluate PD dilation.  The risks of an EUS including intestinal perforation, bleeding, infection, aspiration, and medication effects were discussed as was the possibility it may not give a definitive diagnosis if a biopsy is performed.  When a biopsy of the pancreas is done as part of the EUS, there is an additional risk of pancreatitis at the rate of about 1-2%.  It was explained that procedure related pancreatitis is typically mild, although it can be severe and even life threatening, which is why we do not perform random pancreatic biopsies and only biopsy a lesion/area we feel is concerning enough to warrant the risk.   The risks and benefits of endoscopic evaluation/treatment were discussed with the patient and/or family; these include but are not limited to the risk of perforation, infection, bleeding, missed lesions, lack of diagnosis, severe illness requiring hospitalization, as well as anesthesia and sedation related illnesses.  The patient's history has been reviewed, patient examined, no change in status, and deemed stable for procedure.  The patient and/or family is agreeable to proceed.     Aloha Finner, MD [facility] Office # [phone]

## 2024-06-25 NOTE — Discharge Instructions (Signed)

## 2024-06-25 NOTE — Transfer of Care (Signed)
 Immediate Anesthesia Transfer of Care Note  Patient: Candace Howard  Procedure(s) Performed: ULTRASOUND, UPPER GI TRACT, ENDOSCOPIC EGD (ESOPHAGOGASTRODUODENOSCOPY)  Patient Location: PACU  Anesthesia Type:MAC  Level of Consciousness: awake, alert , and oriented  Airway & Oxygen Therapy: Patient Spontanous Breathing and Patient connected to face mask oxygen  Post-op Assessment: Report given to RN and Post -op Vital signs reviewed and stable  Post vital signs: Reviewed and stable  Last Vitals:  Vitals Value Taken Time  BP    Temp    Pulse    Resp    SpO2      Last Pain:  Vitals:   06/25/24 0830  TempSrc: Temporal  PainSc: 3       Patients Stated Pain Goal: 3 (06/25/24 0830)  Complications: No notable events documented.

## 2024-06-26 ENCOUNTER — Encounter (HOSPITAL_COMMUNITY): Payer: Self-pay | Admitting: Gastroenterology

## 2024-06-26 LAB — SURGICAL PATHOLOGY

## 2024-06-28 ENCOUNTER — Ambulatory Visit: Payer: Self-pay | Admitting: Gastroenterology

## 2024-06-30 ENCOUNTER — Other Ambulatory Visit (HOSPITAL_COMMUNITY): Payer: Self-pay

## 2024-07-09 ENCOUNTER — Ambulatory Visit: Payer: Self-pay

## 2024-07-09 NOTE — Telephone Encounter (Signed)
 FYI Only or Action Required?: FYI only for provider.  Patient was last seen in primary care on 01/18/2024 by Frann Mabel Mt, DO.  Called Nurse Triage reporting Diarrhea.  Symptoms began about a month ago.  Interventions attempted: Nothing.  Symptoms are: gradually worsening.  Triage Disposition: See Physician Within 24 Hours: video appt tomorrow  Patient/caregiver understands and will follow disposition?: Yes   Copied from CRM [id number]. Topic: Clinical - Red Word Triage >> Jul 09, 2024  1:07 PM [place] B wrote: [facility] that prompted transfer to Nurse Triage: pt called in states menopausal symptoms, suicidal thoughts, sleeplessness, diarrhea and constipation   Passing feeling and thought: no plan: has talked to therapist in past. Reason for Disposition  [1] Menopause symptoms (e.g., hot flashes, irregular periods, vaginal dryness) AND [2] interfere with normal activities like work or sleep) AND [3] no improvement after using Care Advice  Patient sounds very upset or troubled to the triager  Answer Assessment - Initial Assessment Questions 1. MAIN SYMPTOM: What is your main symptom? How bad is it?  (e.g., difficulty concentrating, hot flashes, fatigue, irregular periods, vaginal dryness)     *No Answer* 2. ONSET: When did the  begin? (e.g., hours, days or weeks ago)     X 1 month 3. MENOPAUSE: When was your last menstrual period?     *No Answer* 4. MEDICINES: Are you taking any hormone prescription or over-the-counter medicines? (e.g., estrogen; estrogen/progesterone, herbal supplements, medicines for mood, new medicines, Niacin)     *No Answer* 5. OTHER SYMPTOMS: Do you have any other symptoms? (e.g., insomnia, mood swings, palpitations)     Insomnia, hot flashes,mood swings, thoughts 6. CALLER'S COPING: How are you doing? (e.g., anxiety, depression, emotional state with menopause symptoms)     *No Answer* 7. TREATMENTS AND RESPONSE: What have you done  so far to try to make this better?     *No Answer* 8. PREGNANCY: Is there any chance you are pregnant? When was your last menstrual period?     *No Answer*  Answer Assessment - Initial Assessment Questions 1. STOOL PATTERN OR FREQUENCY: How often do you have a bowel movement (BM)?  (Normal range: 3 times a day to every 3 days)  When was your last BM?       na 2. STRAINING: Do you have to strain to have a BM?      yes 3. ONSET: When did the constipation begin?     X few days 4. RECTAL PAIN: Does your rectum hurt when the stool comes out? If Yes, ask: Do you have hemorrhoids? How bad is the pain?  (Scale 1-10; or mild, moderate, severe)     moderate 5. BM COMPOSITION: Are the stools hard?      Hard stools 6. BLOOD ON STOOLS: Has there been any blood on the toilet tissue or on the surface of the BM? If Yes, ask: When was the last time?     no 7. CHRONIC CONSTIPATION: Is this a new problem for you?  If No, ask: How long have you had this problem? (days, weeks, months)      no 8. CHANGES IN DIET OR HYDRATION: Have there been any recent changes in your diet? How much fluids are you drinking on a daily basis?  How much have you had to drink today?     na 9. MEDICINES: Have you been taking any new medicines? Are you taking any narcotic pain medicines? (e.g., Dilaudid, morphine, Percocet, Vicodin)  na 10. LAXATIVES: Have you been using any stool softeners, laxatives, or enemas?  If Yes, ask What are you using, how often, and when was the last time?       no 11. ACTIVITY:  How much walking do you do every day?  Has your activity level decreased in the past week?        Very active 12. CAUSE: What do you think is causing the constipation?        unknown 13. MEDICAL HISTORY: Do you have a history of hemorrhoids, rectal fissures, rectal surgery, or rectal abscess?         na 14. OTHER SYMPTOMS: Do you have any other symptoms? (e.g., abdomen  pain, bloating, fever, vomiting)       Abd pain, , bloating 15. PREGNANCY: Is there any chance you are pregnant? When was your last menstrual period?       na  Answer Assessment - Initial Assessment Questions 1. CONCERN: Did anything happen that prompted you to call today?      Been having thought of suicide: no plan: not thinking of this actively 2. ANXIETY SYMPTOMS: Can you describe how you (your loved one; patient) have been feeling? (e.g., tense, restless, panicky, anxious, keyed up, overwhelmed, sense of impending doom).      Overwhelmed, lack of sleep,  3. ONSET: How long have you been feeling this way? (e.g., hours, days, weeks)     X 1 month 4. SEVERITY: How would you rate the level of anxiety? (e.g., 0 - 10; or mild, moderate, severe).     moderate 5. FUNCTIONAL IMPAIRMENT: How have these feelings affected your ability to do daily activities? Have you had more difficulty than usual doing your normal daily activities? (e.g., getting better, same, worse; self-care, school, work, interactions)     yes 6. HISTORY: Have you felt this way before? Have you ever been diagnosed with an anxiety problem in the past? (e.g., generalized anxiety disorder, panic attacks, PTSD). If Yes, ask: How was this problem treated? (e.g., medicines, counseling, etc.)     mo 7. RISK OF HARM - SUICIDAL IDEATION: Do you ever have thoughts of hurting or killing yourself? If Yes, ask:  Do you have these feelings now? Do you have a plan on how you would do this?     Yes - thought of but no plan 8. TREATMENT:  What has been done so far to treat this anxiety? (e.g., medicines, relaxation strategies). What has helped?     therapist 9. THERAPIST: Do you have a counselor or therapist? If Yes, ask: What is their name?     Has been talking with therapist about these thoughts 10. POTENTIAL TRIGGERS: Do you drink caffeinated beverages (e.g., coffee, colas, teas), and how much daily? Do  you drink alcohol or use any drugs? Have you started any new medicines recently?       no 11. PATIENT SUPPORT: Who is with you now? Who do you live with? Do you have family or friends who you can talk to?        yes 12. OTHER SYMPTOMS: Do you have any other symptoms? (e.g., feeling depressed, trouble concentrating, trouble sleeping, trouble breathing, palpitations or fast heartbeat, chest pain, sweating, nausea, or diarrhea)       trouble concentrating, trouble sleeping, 13. PREGNANCY: Is there any chance you are pregnant? When was your last menstrual period?       na  Protocols used: Constipation-A-AH, Menopause Symptoms and Questions-A-AH, Anxiety  and Panic Attack-A-AH

## 2024-07-09 NOTE — Telephone Encounter (Signed)
 Spoke with patient she states she is does not feel like she wants to hurt herself and feels safe.  She will not be able to come in person but is grateful for the video visit with provider Lorren tomorrow morning.   Scheduled a f/u appt. With Dr. Newlin, 2 weeks. August 5th at 9:10. Per patient request.

## 2024-07-10 ENCOUNTER — Telehealth (INDEPENDENT_AMBULATORY_CARE_PROVIDER_SITE_OTHER): Payer: Self-pay | Admitting: Family

## 2024-07-10 ENCOUNTER — Other Ambulatory Visit: Payer: Self-pay

## 2024-07-10 ENCOUNTER — Ambulatory Visit: Admitting: Family Medicine

## 2024-07-10 ENCOUNTER — Other Ambulatory Visit (HOSPITAL_COMMUNITY): Payer: Self-pay

## 2024-07-10 DIAGNOSIS — N951 Menopausal and female climacteric states: Secondary | ICD-10-CM | POA: Diagnosis not present

## 2024-07-10 DIAGNOSIS — N926 Irregular menstruation, unspecified: Secondary | ICD-10-CM | POA: Diagnosis not present

## 2024-07-10 MED ORDER — PAROXETINE MESYLATE 7.5 MG PO CAPS
1.0000 | ORAL_CAPSULE | Freq: Every day | ORAL | 0 refills | Status: DC
  Filled 2024-07-10: qty 90, fill #0
  Filled 2024-07-10: qty 90, 90d supply, fill #0

## 2024-07-10 NOTE — Progress Notes (Signed)
 Virtual Visit via Video Note  I connected with Candace Howard, on 07/10/2024 at 10:01 AM by video and verified that I am speaking with the correct person using two identifiers.  Consent: I discussed the limitations, risks, security and privacy concerns of performing an evaluation and management service by video and the availability of in person appointments. I also discussed with the patient that there may be a patient responsible charge related to this service. The patient expressed understanding and agreed to proceed.   Location of Patient: Home  Location of Provider: [facility]   Persons participating in Telemedicine visit: Candace Howard Candace Drones, NP Purvis Pepper, CMA   History of Present Illness: Candace Howard is a 53 y.o. female who presents for menopause symptoms.   Her issues/concerns for discussion today includes: - States fatigue, feeling cold, mood swings, and irregular periods for months. - States she is established with Psychiatry for mental health/insomnia management and has upcoming appointment. Reports she is taking medications as prescribed. She denies thoughts of self-harm, suicidal ideations, homicidal ideations.  - States she is established with Gastroenterology for diarrhea/constipation management. She denies red flag symptoms. States she plans to schedule an appointment with Gastroenterology soon.   Past Medical History:  Diagnosis Date   Anemia    Anxiety    Arthritis    Asthma    Chest pain    COPD (chronic obstructive pulmonary disease) (HCC)    Depression    Difficulty breathing    Frequent headaches    History of hepatitis C    IBS (irritable bowel syndrome)    Palpitations    Pancreatitis    Poor circulation    Allergies  Allergen Reactions   Hydrocodone  Other (See Comments)    Other reaction(s): Unknown/See Comments Feel really high    Current Outpatient Medications on File Prior to Visit   Medication Sig Dispense Refill   Accu-Chek Softclix Lancets lancets Use as instructed 100 each 12   albuterol  (VENTOLIN  HFA) 108 (90 Base) MCG/ACT inhaler Inhale 2 puffs into the lungs every 6 (six) hours as needed for wheezing or shortness of breath.     benzonatate  (TESSALON ) 100 MG capsule Take 1 capsule (100 mg total) by mouth every 8 (eight) hours. 21 capsule 0   Blood Glucose Monitoring Suppl (ACCU-CHEK GUIDE) w/Device KIT use as directed once daily 1 kit 0   Blood Glucose Monitoring Suppl (TRUE METRIX METER) w/Device KIT Use to check blood sugar 2 (two) times daily. 1 kit 0   budesonide -formoterol  (SYMBICORT ) 160-4.5 MCG/ACT inhaler Inhale 2 puffs into the lungs 2 (two) times daily. 10.2 g 3   Continuous Glucose Sensor (FREESTYLE LIBRE 3 SENSOR) MISC Place 1 sensor on the skin every 14 days. Use to check glucose continuously 3 each 12   gabapentin  (NEURONTIN ) 100 MG capsule Take 1 capsule (100 mg total) by mouth at bedtime. 90 capsule 3   glucose blood (ACCU-CHEK GUIDE) test strip use  2 (two) times daily as instructed 100 each 12   glucose blood (TRUE METRIX BLOOD GLUCOSE TEST) test strip Use as instructed 100 each 12   hydrocortisone  (ANUSOL -HC) 2.5 % rectal cream Place 1 Application rectally 2 (two) times daily. 30 g 1   ibuprofen  (ADVIL ) 800 MG tablet Take 1 tablet (800 mg total) by mouth every 6 (six) hours for mild pain/inflammation. 30 tablet 1   lipase/protease/amylase (CREON ) 36000 UNITS CPEP capsule Take 2 capsules (72,000 Units total) by mouth 3 (three) times  daily with meals. May also take 1 capsule (36,000 Units total) as needed (with snacks - up to 4 snacks daily).     metFORMIN  (GLUCOPHAGE -XR) 500 MG 24 hr tablet Take 1 tablet (500 mg total) by mouth 2 (two) times daily with a meal. (Patient not taking: Reported on 06/21/2024) 180 tablet 0   nicotine  (NICODERM CQ ) 21 mg/24hr patch Place 1 patch (21 mg total) onto the skin daily. 28 patch 3   nystatin  (MYCOSTATIN ) 100000 UNIT/ML  suspension Take 5 mLs (500,000 Units total) by mouth 4 (four) times daily. 120 mL 0   pantoprazole  (PROTONIX ) 40 MG tablet Take 1 tablet (40 mg total) by mouth daily. 30 tablet 6   risperiDONE  (RISPERDAL ) 0.5 MG tablet Take 1 tablet (0.5 mg total) by mouth 2 (two) times daily as needed. 60 tablet 3   risperiDONE  (RISPERDAL ) 3 MG tablet Take 1 tablet (3 mg total) by mouth daily. 30 tablet 3   traZODone  (DESYREL ) 150 MG tablet Take 1 tablet (150 mg total) by mouth at bedtime as needed. 30 tablet 3   TRUEplus Lancets 28G MISC Use to check blood sugar 2 (two) times daily. 100 each 3   No current facility-administered medications on file prior to visit.    Observations/Objective: Alert and oriented x 3. Not in acute distress. Physical examination not completed as this is a telemedicine visit.  Assessment and Plan: 1. Perimenopausal vasomotor symptoms (Primary) 2. Irregular menses - Paroxetine  Mesylate as prescribed. Counseled on medication adherence/adverse effects. - Routine screening. Patient plans to schedule a lab only appointment at later date for collection. - Referral to Gynecology for evaluation/management.  - Follow-up with primary provider as scheduled.  - Follicle stimulating hormone; Future - Luteinizing hormone; Future - PARoxetine  Mesylate 7.5 MG CAPS; Take 1 capsule by mouth daily.  Dispense: 90 capsule; Refill: 0 - Ambulatory referral to Gynecology - hCG, serum, qualitative; Future   Follow Up Instructions: Follow-up with primary provider as scheduled.    Patient was given clear instructions to go to Emergency Department or return to medical center if symptoms don't improve, worsen, or new problems develop.The patient verbalized understanding.  I discussed the assessment and treatment plan with the patient. The patient was provided an opportunity to ask questions and all were answered. The patient agreed with the plan and demonstrated an understanding of the instructions.    The patient was advised to call back or seek an in-person evaluation if the symptoms worsen or if the condition fails to improve as anticipated.     I provided 7 minutes total of non-face-to-face time during this encounter.   Candace JINNY Drones, NP  [facility] [place], [place] [phone] 07/10/2024, 10:01 AM

## 2024-07-16 ENCOUNTER — Other Ambulatory Visit: Payer: Self-pay | Admitting: Family

## 2024-07-16 ENCOUNTER — Other Ambulatory Visit (HOSPITAL_COMMUNITY): Payer: Self-pay

## 2024-07-16 DIAGNOSIS — N951 Menopausal and female climacteric states: Secondary | ICD-10-CM

## 2024-07-16 DIAGNOSIS — N926 Irregular menstruation, unspecified: Secondary | ICD-10-CM

## 2024-07-18 ENCOUNTER — Other Ambulatory Visit (HOSPITAL_COMMUNITY): Payer: Self-pay

## 2024-07-19 ENCOUNTER — Other Ambulatory Visit (HOSPITAL_COMMUNITY): Payer: Self-pay

## 2024-07-23 ENCOUNTER — Telehealth: Payer: Self-pay | Admitting: Family Medicine

## 2024-07-23 NOTE — Telephone Encounter (Signed)
 Called patient to confirm upcoming appointment 07/24/2024. Patient requested to cancel appointment as she will be out of town.

## 2024-07-24 ENCOUNTER — Ambulatory Visit: Admitting: Family Medicine

## 2024-07-27 ENCOUNTER — Other Ambulatory Visit: Payer: Self-pay

## 2024-07-28 ENCOUNTER — Other Ambulatory Visit (HOSPITAL_COMMUNITY): Payer: Self-pay

## 2024-08-02 ENCOUNTER — Other Ambulatory Visit (HOSPITAL_COMMUNITY): Payer: Self-pay

## 2024-08-03 ENCOUNTER — Other Ambulatory Visit (HOSPITAL_COMMUNITY): Payer: Self-pay

## 2024-08-10 ENCOUNTER — Encounter (HOSPITAL_COMMUNITY): Payer: Self-pay

## 2024-08-10 ENCOUNTER — Ambulatory Visit (HOSPITAL_COMMUNITY): Admitting: Clinical

## 2024-08-13 ENCOUNTER — Other Ambulatory Visit (HOSPITAL_COMMUNITY): Payer: Self-pay

## 2024-08-16 ENCOUNTER — Other Ambulatory Visit: Payer: Self-pay

## 2024-08-16 ENCOUNTER — Encounter (HOSPITAL_COMMUNITY): Payer: Self-pay | Admitting: Family

## 2024-08-16 ENCOUNTER — Telehealth (INDEPENDENT_AMBULATORY_CARE_PROVIDER_SITE_OTHER): Admitting: Family

## 2024-08-16 ENCOUNTER — Other Ambulatory Visit (HOSPITAL_COMMUNITY): Payer: Self-pay

## 2024-08-16 DIAGNOSIS — F3160 Bipolar disorder, current episode mixed, unspecified: Secondary | ICD-10-CM | POA: Diagnosis not present

## 2024-08-16 DIAGNOSIS — F17210 Nicotine dependence, cigarettes, uncomplicated: Secondary | ICD-10-CM

## 2024-08-16 DIAGNOSIS — F411 Generalized anxiety disorder: Secondary | ICD-10-CM

## 2024-08-16 DIAGNOSIS — N926 Irregular menstruation, unspecified: Secondary | ICD-10-CM

## 2024-08-16 DIAGNOSIS — N951 Menopausal and female climacteric states: Secondary | ICD-10-CM

## 2024-08-16 DIAGNOSIS — F17200 Nicotine dependence, unspecified, uncomplicated: Secondary | ICD-10-CM | POA: Diagnosis not present

## 2024-08-16 MED ORDER — TRAZODONE HCL 150 MG PO TABS
150.0000 mg | ORAL_TABLET | Freq: Every evening | ORAL | 3 refills | Status: DC | PRN
  Filled 2024-08-16 – 2024-08-27 (×2): qty 30, 30d supply, fill #0
  Filled 2024-09-27 (×2): qty 30, 30d supply, fill #1
  Filled 2024-10-25: qty 30, 30d supply, fill #2
  Filled 2024-10-25: qty 30, 30d supply, fill #0

## 2024-08-16 MED ORDER — RISPERIDONE 0.5 MG PO TABS
0.5000 mg | ORAL_TABLET | Freq: Two times a day (BID) | ORAL | 3 refills | Status: DC | PRN
  Filled 2024-08-16: qty 60, 30d supply, fill #0

## 2024-08-16 MED ORDER — RISPERIDONE 3 MG PO TABS
3.0000 mg | ORAL_TABLET | Freq: Every day | ORAL | 3 refills | Status: DC
  Filled 2024-08-16 – 2024-09-15 (×2): qty 30, 30d supply, fill #0
  Filled 2024-10-19: qty 30, 30d supply, fill #1
  Filled 2024-11-13 (×3): qty 30, 30d supply, fill #2

## 2024-08-16 MED ORDER — GABAPENTIN 100 MG PO CAPS
100.0000 mg | ORAL_CAPSULE | Freq: Every day | ORAL | 3 refills | Status: DC
  Filled 2024-08-16: qty 90, 90d supply, fill #0
  Filled 2024-08-27: qty 30, 30d supply, fill #0
  Filled 2024-09-27 (×2): qty 30, 30d supply, fill #1
  Filled 2024-10-27: qty 30, 30d supply, fill #2

## 2024-08-16 MED ORDER — PAROXETINE MESYLATE 7.5 MG PO CAPS
1.0000 | ORAL_CAPSULE | Freq: Every day | ORAL | 0 refills | Status: DC
  Filled 2024-08-16: qty 90, fill #0

## 2024-08-16 NOTE — Progress Notes (Signed)
 [facility] MD/PA/NP OP Progress Note Virtual Visit via Video Note  I connected with Candace Howard on 08/16/24 at 10:00 AM EDT by a video enabled telemedicine application and verified that I am speaking with the correct person using two identifiers.  Location: Patient: Home Provider: Clinic   I discussed the limitations of evaluation and management by telemedicine and the availability of in person appointments. The patient expressed understanding and agreed to proceed.  I provided 30 minutes of non-face-to-face time during this encounter.   08/16/2024 10:38 AM Shavonn Convey  MRN:  [id number]  Chief Complaint:My anxiety has been elevated and I have been coughing  HPI: 53 year old female seen today for follow-up psychiatric evaluation.  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  Currently she is being managed on Risperdal  3 mg nightly, gabapentin  100 mg 3 times daily (reports that she became addicted to higher doses), and trazodone  150 mg nightly as needed.  Today she reports her medications are somewhat effective in managing her psychiatric condition.  Patient presented well-groomed, pleasant, cooperative, and engaged in conversation. She reports increased anxiety, largely related to worry about her son's recent relapse on illicit substances. She also experiences stress at work, describing feelings of being overwhelmed and lacking confidence in her job performance, despite objective evidence of doing well.  She reports experiencing a nighttime cough, which began after her Risperdal  dose was increased. Provider asked about smoking habits; patient reports smoking approximately an hour before bed. She was counseled that smoking may exacerbate airway inflammation and worsen cough when lying down. She inquired if her medications could be contributing; provider reviewed that trazodone   may cause throat irritation and risperidone  may rarely contribute to cough, but tobacco use is the more likely cause. She expressed interest in Nicorette  gum/patches for smoking cessation support.  Patient continues to have a history of pancreatitis and is on Creon ; she notes improvement and denies pain. 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   She reports medication access challenges and was referred to Ms. CANDIE Louder for Medicaid assistance.  Visit Diagnosis:    ICD-10-CM   1. Anxiety state  F41.1     2. Bipolar affective disorder, current episode mixed, without psychotic features (HCC)  F31.60     3. Tobacco dependence  F17.200     4. Perimenopausal vasomotor symptoms  N95.1     5. Irregular menses  N92.6     6. Smokes 1.5-2 packs of cigarettes per day  F17.210           Past Psychiatric History: substance use (Alcohol, Tobacco, meth, and cocaine) Bipolar affective disorder, Depression, anxiety, and PTSD  Past Medical History:  Past Medical History:  Diagnosis Date   Anemia    Anxiety    Arthritis    Asthma    Chest pain    COPD (chronic obstructive pulmonary disease) (HCC)    Depression    Difficulty breathing    Frequent headaches    History of hepatitis C    IBS (irritable bowel syndrome)    Palpitations    Pancreatitis    Poor circulation     Past Surgical History:  Procedure Laterality Date   CARPAL TUNNEL RELEASE Bilateral    COLONOSCOPY     around age 31. [facility] Dr Larene said they removed polyps   ESOPHAGOGASTRODUODENOSCOPY N/A 06/25/2024   Procedure: EGD (ESOPHAGOGASTRODUODENOSCOPY);  Surgeon:  Mansouraty, Aloha Raddle., MD;  Location: [facility];  Service: Gastroenterology;  Laterality: N/A;   EUS N/A 06/25/2024   Procedure: ULTRASOUND, UPPER GI TRACT, ENDOSCOPIC;  Surgeon: Wilhelmenia Aloha Raddle., MD;  Location: [facility];  Service: Gastroenterology;  Laterality: N/A;   SKIN GRAFT Bilateral     Family  Psychiatric History:  Son Substance use, maternal 4 uncles SI, mother abuse of pill use   Family History:  Family History  Problem Relation Age of Onset   Colon cancer Mother    Bladder Cancer Mother        initially and spreaded to the colon and all over per patient   COPD Mother    Glaucoma Mother    Irritable bowel syndrome Mother    Heart disease Father    Hypertension Father    Diabetes Father    Diabetes Paternal Grandmother     Social History:  Social History   Socioeconomic History   Marital status: Single    Spouse name: Not on file   Number of children: Not on file   Years of education: Not on file   Highest education level: Not on file  Occupational History   Not on file  Tobacco Use   Smoking status: Every Day    Current packs/day: 0.50    Average packs/day: 0.5 packs/day for 35.0 years (17.5 ttl pk-yrs)    Types: Cigarettes   Smokeless tobacco: Never   Tobacco comments:    Pt states she started back smoking 1 month ago and is smoking 1 ppd. ALS 03/31/23  Vaping Use   Vaping status: Never Used  Substance and Sexual Activity   Alcohol use: Not Currently    Comment: 2 months sober.   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   Sexual activity: Not Currently  Other Topics Concern   Not on file  Social History Narrative   Not on file   Social Drivers of Health   Financial Resource Strain: High Risk (01/11/2022)   Overall Financial Resource Strain (CARDIA)    Difficulty of Paying Living Expenses: Hard  Food Insecurity: Low Risk  (04/28/2023)   Received from [facility]   Hunger Vital Sign    Within the past 12 months, you worried that your food would run out before you got money to buy more: Never true    Within the past 12 months, the food you bought just didn't last and you didn't have money to get more. : Never true  Transportation Needs: Not on file (04/28/2023)  Physical Activity: Inactive (01/11/2022)   Exercise Vital  Sign    Days of Exercise per Week: 0 days    Minutes of Exercise per Session: 0 min  Stress: Stress Concern Present (01/11/2022)   [employer] of Occupational Health - Occupational Stress Questionnaire    Feeling of Stress : Very much  Social Connections: Socially Isolated (01/11/2022)   Social Connection and Isolation Panel    Frequency of Communication with Friends and Family: More than three times a week    Frequency of Social Gatherings with Friends and Family: Three times a week    Attends Religious Services: Never    Active Member of Clubs or Organizations: No    Attends Banker Meetings: Never    Marital Status: Never married    Allergies:  Allergies  Allergen Reactions   Hydrocodone  Other (See Comments)    Other reaction(s): Unknown/See Comments Feel really high  Metabolic Disorder Labs: Lab Results  Component Value Date   HGBA1C 8.5 (A) 12/01/2023   MPG 108.28 12/25/2021   No results found for: PROLACTIN Lab Results  Component Value Date   CHOL 182 12/27/2021   TRIG 114 12/27/2021   HDL 49 12/27/2021   CHOLHDL 3.7 12/27/2021   VLDL 23 12/27/2021   LDLCALC 110 (H) 12/27/2021   LDLCALC 130 (H) 12/25/2021   Lab Results  Component Value Date   TSH 0.636 12/25/2021    Therapeutic Level Labs: No results found for: LITHIUM No results found for: VALPROATE No results found for: CBMZ  Current Medications: Current Outpatient Medications  Medication Sig Dispense Refill   Accu-Chek Softclix Lancets lancets Use as instructed 100 each 12   albuterol  (VENTOLIN  HFA) 108 (90 Base) MCG/ACT inhaler Inhale 2 puffs into the lungs every 6 (six) hours as needed for wheezing or shortness of breath.     benzonatate  (TESSALON ) 100 MG capsule Take 1 capsule (100 mg total) by mouth every 8 (eight) hours. 21 capsule 0   Blood Glucose Monitoring Suppl (ACCU-CHEK GUIDE) w/Device KIT use as directed once daily 1 kit 0   Blood Glucose Monitoring Suppl  (TRUE METRIX METER) w/Device KIT Use to check blood sugar 2 (two) times daily. 1 kit 0   budesonide -formoterol  (SYMBICORT ) 160-4.5 MCG/ACT inhaler Inhale 2 puffs into the lungs 2 (two) times daily. 10.2 g 3   Continuous Glucose Sensor (FREESTYLE LIBRE 3 SENSOR) MISC Place 1 sensor on the skin every 14 days. Use to check glucose continuously 3 each 12   gabapentin  (NEURONTIN ) 100 MG capsule Take 1 capsule (100 mg total) by mouth at bedtime. 90 capsule 3   glucose blood (ACCU-CHEK GUIDE) test strip use  2 (two) times daily as instructed 100 each 12   glucose blood (TRUE METRIX BLOOD GLUCOSE TEST) test strip Use as instructed 100 each 12   hydrocortisone  (ANUSOL -HC) 2.5 % rectal cream Place 1 Application rectally 2 (two) times daily. 30 g 1   ibuprofen  (ADVIL ) 800 MG tablet Take 1 tablet (800 mg total) by mouth every 6 (six) hours for mild pain/inflammation. 30 tablet 1   lipase/protease/amylase (CREON ) 36000 UNITS CPEP capsule Take 2 capsules (72,000 Units total) by mouth 3 (three) times daily with meals. May also take 1 capsule (36,000 Units total) as needed (with snacks - up to 4 snacks daily).     metFORMIN  (GLUCOPHAGE -XR) 500 MG 24 hr tablet Take 1 tablet (500 mg total) by mouth 2 (two) times daily with a meal. (Patient not taking: Reported on 06/21/2024) 180 tablet 0   nicotine  (NICODERM CQ ) 21 mg/24hr patch Place 1 patch (21 mg total) onto the skin daily. 28 patch 3   nystatin  (MYCOSTATIN ) 100000 UNIT/ML suspension Take 5 mLs (500,000 Units total) by mouth 4 (four) times daily. 120 mL 0   pantoprazole  (PROTONIX ) 40 MG tablet Take 1 tablet (40 mg total) by mouth daily. 30 tablet 6   PARoxetine  Mesylate 7.5 MG CAPS Take 1 capsule by mouth daily. 90 capsule 0   risperiDONE  (RISPERDAL ) 0.5 MG tablet Take 1 tablet (0.5 mg total) by mouth 2 (two) times daily as needed. 60 tablet 3   risperiDONE  (RISPERDAL ) 3 MG tablet Take 1 tablet (3 mg total) by mouth daily. 30 tablet 3   traZODone  (DESYREL ) 150 MG  tablet Take 1 tablet (150 mg total) by mouth at bedtime as needed. 30 tablet 3   TRUEplus Lancets 28G MISC Use to check blood sugar 2 (two)  times daily. 100 each 3   No current facility-administered medications for this visit.     Musculoskeletal: Strength & Muscle Tone: within normal limits telehealth visit Gait & Station: normal telehealth visit Patient leans: N/A  Psychiatric Specialty Exam: Review of Systems  Respiratory:  Negative for cough.   Psychiatric/Behavioral:  The patient is nervous/anxious.   All other systems reviewed and are negative.   There were no vitals taken for this visit.There is no height or weight on file to calculate BMI.  General Appearance: Well Groomed  Eye Contact:  Good  Speech:  Clear and Coherent and Normal Rate  Volume:  Normal  Mood:  Anxious and Depressed  Affect:  Appropriate and Congruent  Thought Process:  Coherent, Goal Directed, and Linear  Orientation:  Full (Time, Place, and Person)  Thought Content: WDL   Suicidal Thoughts:  No  Homicidal Thoughts:  No  Memory:  Immediate;   Good Recent;   Good Remote;   Good  Judgement:  Good  Insight:  Good  Psychomotor Activity:  Normal  Concentration:  Concentration: Good and Attention Span: Good  Recall:  Good  Fund of Knowledge: Good  Language: Good  Akathisia:  No  Handed:  Left  AIMS (if indicated): not done  Assets:  Communication Skills Desire for Improvement Financial Resources/Insurance Housing Physical Health Social Support  ADL's:  Intact  Cognition: WNL  Sleep:  Fair   Screenings: AIMS    Flowsheet Row Clinical Support from 01/11/2022 in [facility]  AIMS Total Score 0 0   AUDIT    Flowsheet Row [facility] (Discharged[facility]  Alcohol Use Disorder Identification Test Final Score (AUDIT) 0   GAD-7     Flowsheet Row Video Visit from 05/30/2024 in [facility] Video Visit from 04/11/2024 in [facility] Video Visit from 01/18/2024 in [facility] Video Visit from 11/02/2023 in [facility] Video Visit from 08/10/2023 in [facility]  Total GAD-7 Score 14 8 6 6 5    PHQ2-9    Flowsheet Row Video Visit from 05/30/2024 in [facility] Video Visit from 04/11/2024 in [facility] Video Visit from 01/18/2024 in [facility] Video Visit from 11/02/2023 in [facility] Video Visit from 08/10/2023 in [facility]  PHQ-2 Total Score 3 1 0 2 0  PHQ-9 Total Score 15 3 1 10 6    Flowsheet Row Video Visit from 05/30/2024 in [facility] ED from 11/16/2023 in [facility] Emergency Department at [facility] Office Visit from 02/28/2023 in [facility]  C-SSRS RISK CATEGORY Error: Q7 should not be populated when Q6 is No No Risk Low Risk     Assessment and Plan: Potential side effects of medication and risks vs benefits of treatment vs non-treatment were explained and discussed. All questions were answered.  Patient referred to outpatient counseling for therapy.  No other concerns at this time.    1. Anxiety state  Continue- gabapentin  (NEURONTIN ) 100 MG capsule; Take 1 capsule (100 mg total) by mouth at bedtime.  Dispense: 90 capsule; Refill: 3 Continue- risperiDONE  (RISPERDAL ) 3 MG tablet; Take 1 tablet (3 mg total) by mouth daily.  Dispense: 30 tablet; Refill: 3 Start-  risperiDONE  (RISPERDAL ) 0.5 MG tablet; Take 1 tablet (0.5 mg total) by mouth 2 (two) times daily as needed.  Dispense: 60 tablet; Refill: 3 - Ambulatory referral to Social Work  2. Bipolar affective disorder,  current episode mixed, without psychotic features (HCC)  Continue- gabapentin  (NEURONTIN ) 100 MG capsule; Take 1 capsule (100 mg total) by mouth at bedtime.  Dispense: 90 capsule; Refill: 3 Continue- risperiDONE  (RISPERDAL ) 3 MG tablet; Take 1 tablet (3 mg total) by mouth daily.  Dispense: 30 tablet; Refill: 3 Start- risperiDONE  (RISPERDAL ) 0.5 MG tablet; Take 1 tablet (0.5 mg total) by mouth 2 (two) times daily as needed.  Dispense: 60 tablet; Refill: 3 - Ambulatory referral to Social Work  3. Tobacco dependence (Primary)  Start- nicotine  (NICODERM CQ ) 21 mg/24hr patch; Place 1 patch (21 mg total) onto the skin daily.  Dispense: 28 patch; Refill: 3    Collaboration of Care: Collaboration of Care: Other provider involved in patient's care AEB PCP  Patient/Guardian was advised Release of Information must be obtained prior to any record release in order to collaborate their care with an outside provider. Patient/Guardian was advised if they have not already done so to contact the registration department to sign all necessary forms in order for us  to release information regarding their care.   Consent: Patient/Guardian gives verbal consent for treatment and assignment of benefits for services provided during this visit. Patient/Guardian expressed understanding and agreed to proceed.   Follow-up in 2.5 months Majel GORMAN Ramp, FNP 08/16/2024, 10:38 AM

## 2024-08-27 ENCOUNTER — Telehealth (HOSPITAL_COMMUNITY): Admitting: Psychiatry

## 2024-08-27 ENCOUNTER — Other Ambulatory Visit (HOSPITAL_COMMUNITY): Payer: Self-pay

## 2024-09-15 ENCOUNTER — Other Ambulatory Visit (HOSPITAL_COMMUNITY): Payer: Self-pay

## 2024-09-27 ENCOUNTER — Other Ambulatory Visit (HOSPITAL_COMMUNITY): Payer: Self-pay

## 2024-09-27 ENCOUNTER — Other Ambulatory Visit: Payer: Self-pay

## 2024-10-19 ENCOUNTER — Other Ambulatory Visit (HOSPITAL_COMMUNITY): Payer: Self-pay

## 2024-10-25 ENCOUNTER — Telehealth: Payer: Self-pay | Admitting: Internal Medicine

## 2024-10-25 ENCOUNTER — Other Ambulatory Visit: Payer: Self-pay

## 2024-10-25 ENCOUNTER — Other Ambulatory Visit (HOSPITAL_COMMUNITY): Payer: Self-pay

## 2024-10-25 NOTE — Telephone Encounter (Signed)
 PT is calling to speak to a nurse. She is having severe nausea along with epigastric pain. Please advise.

## 2024-10-25 NOTE — Telephone Encounter (Signed)
 Returned patient call & she is having nausea (1 episode of vomiting) and epigastric pain occasionally after meals. BM's normal, every 1-2 days. She was last on 72,000 units of creon  per meal & 36,000 units of creon /snack, however has not been taking it consistently. Advised she take medication as scheduled for at least 1 week to see if symptoms improve, can also try IBGard OTC if needed, and if no improvements then to call back. Pt verbalized all understanding.

## 2024-11-13 ENCOUNTER — Ambulatory Visit: Attending: Nurse Practitioner | Admitting: Nurse Practitioner

## 2024-11-13 ENCOUNTER — Encounter: Payer: Self-pay | Admitting: Nurse Practitioner

## 2024-11-13 ENCOUNTER — Other Ambulatory Visit (HOSPITAL_COMMUNITY): Payer: Self-pay

## 2024-11-13 VITALS — BP 108/68 | HR 84 | Resp 18 | Ht 64.0 in | Wt 133.4 lb

## 2024-11-13 DIAGNOSIS — K219 Gastro-esophageal reflux disease without esophagitis: Secondary | ICD-10-CM

## 2024-11-13 DIAGNOSIS — R7989 Other specified abnormal findings of blood chemistry: Secondary | ICD-10-CM | POA: Diagnosis not present

## 2024-11-13 DIAGNOSIS — J441 Chronic obstructive pulmonary disease with (acute) exacerbation: Secondary | ICD-10-CM

## 2024-11-13 DIAGNOSIS — E7800 Pure hypercholesterolemia, unspecified: Secondary | ICD-10-CM | POA: Diagnosis not present

## 2024-11-13 DIAGNOSIS — E1169 Type 2 diabetes mellitus with other specified complication: Secondary | ICD-10-CM

## 2024-11-13 DIAGNOSIS — E119 Type 2 diabetes mellitus without complications: Secondary | ICD-10-CM

## 2024-11-13 DIAGNOSIS — Z1231 Encounter for screening mammogram for malignant neoplasm of breast: Secondary | ICD-10-CM

## 2024-11-13 DIAGNOSIS — Z23 Encounter for immunization: Secondary | ICD-10-CM

## 2024-11-13 LAB — POCT GLYCOSYLATED HEMOGLOBIN (HGB A1C): Hemoglobin A1C: 7.7 % — AB (ref 4.0–5.6)

## 2024-11-13 MED ORDER — EMPAGLIFLOZIN 10 MG PO TABS
10.0000 mg | ORAL_TABLET | Freq: Every day | ORAL | 1 refills | Status: AC
  Filled 2024-11-13: qty 30, 30d supply, fill #0
  Filled 2024-11-13: qty 90, 90d supply, fill #0
  Filled 2024-12-24: qty 30, 30d supply, fill #1

## 2024-11-13 MED ORDER — BUDESONIDE-FORMOTEROL FUMARATE 160-4.5 MCG/ACT IN AERO
2.0000 | INHALATION_SPRAY | Freq: Two times a day (BID) | RESPIRATORY_TRACT | 3 refills | Status: AC
  Filled 2024-11-13 (×2): qty 10.2, 30d supply, fill #0
  Filled 2024-12-24: qty 10.2, 30d supply, fill #1

## 2024-11-13 MED ORDER — PANTOPRAZOLE SODIUM 40 MG PO TBEC
40.0000 mg | DELAYED_RELEASE_TABLET | Freq: Every day | ORAL | 1 refills | Status: AC
  Filled 2024-11-13 (×2): qty 90, 90d supply, fill #0

## 2024-11-13 NOTE — Progress Notes (Signed)
 Assessment & Plan:  Candace Howard was seen today for diabetes and gastroesophageal reflux.  Diagnoses and all orders for this visit:  Type 2 diabetes mellitus with other specified complication, without long-term current use of insulin (HCC) -     POCT glycosylated hemoglobin (Hb A1C) -     Urine Albumin/Creatinine with ratio (send out) [LAB689] -     CMP14+EGFR -     empagliflozin  (JARDIANCE ) 10 MG TABS tablet; Take 1 tablet (10 mg total) by mouth daily. For diabetes A1c decreased to 7.7. She is not taking metformin  due to side effects. Discussed complications of uncontrolled diabetes. Open to trying a different medication. - Prescribed Jardiance  once daily. - Checked availability of Candace Howard sensors. She will need to follow up with our pharmacist who is heading the study.   Need for influenza vaccination -     Flu vaccine trivalent PF, 6mos and older(Flulaval,Afluria,Fluarix,Fluzone)  COPD exacerbation (HCC) -     budesonide -formoterol  (SYMBICORT ) 160-4.5 MCG/ACT inhaler; Inhale 2 puffs into the lungs 2 (two) times daily.  Abnormal CBC -     CBC with Differential/Platelet  GERD without esophagitis -     pantoprazole  (PROTONIX ) 40 MG tablet; Take 1 tablet (40 mg total) by mouth daily. INSTRUCTIONS: Avoid GERD Triggers: acidic, spicy or fried foods, caffeine, coffee, sodas,  alcohol and chocolate.   Advised against ibuprofen ; recommended extra strength Tylenol . - Encouraged gastroenterology follow-up if symptoms persist.  Breast cancer screening by mammogram -     MM 3D SCREENING MAMMOGRAM BILATERAL BREAST; Future  Hypercholesterolemia -     Lipid panel    Patient has been counseled on age-appropriate routine health concerns for screening and prevention. These are reviewed and up-to-date. Referrals have been placed accordingly. Immunizations are up-to-date or declined.    Subjective:   Chief Complaint  Patient presents with   Diabetes   Gastroesophageal Reflux   Candace Howard is a 53 year old female who presents for an acute visit regarding her diabetes management.  She is a patient of Candace Howard   has a past medical history of Anemia, Anxiety, Arthritis, Asthma, Chest pain, COPD (chronic obstructive pulmonary disease) (HCC), Depression, Difficulty breathing, Frequent headaches, History of hepatitis C, IBS (irritable bowel syndrome), Palpitations, Pancreatitis, and Poor circulation.   DM She has not been taking metformin  due to significant diarrhea it caused.  Although her A1c is down from 8.5 to 7.7, diabetes is still not at goal. She is not using the Candace Howard sensors anymore as she has not been consistent with the Liberate study Lab Results  Component Value Date   HGBA1C 7.7 (A) 11/13/2024      She experiences significant gastrointestinal issues, including acid reflux and nausea after eating, which have been affecting her daily life and social engagements. She has been losing weight and has not been taking her pantoprazole  regularly. She is currently on Creon  but reports it is not helping. An endoscopy in July revealed esophagitis.   She has a history of anxiety and depression but declines refill of paxil  today.   Overdue for mammogram. Referral placed.    Review of Systems  Constitutional:  Negative for fever, malaise/fatigue and weight loss.  HENT: Negative.  Negative for nosebleeds.   Eyes: Negative.  Negative for blurred vision, double vision and photophobia.  Respiratory: Negative.  Negative for cough and shortness of breath.   Cardiovascular: Negative.  Negative for chest pain, palpitations and leg swelling.  Gastrointestinal:  Positive for heartburn. Negative for  nausea and vomiting.  Musculoskeletal: Negative.  Negative for myalgias.  Neurological: Negative.  Negative for dizziness, focal weakness, seizures and headaches.  Psychiatric/Behavioral: Negative.  Negative for suicidal ideas.     Past Medical History:  Diagnosis Date    Anemia    Anxiety    Arthritis    Asthma    Chest pain    COPD (chronic obstructive pulmonary disease) (HCC)    Depression    Difficulty breathing    Frequent headaches    History of hepatitis C    IBS (irritable bowel syndrome)    Palpitations    Pancreatitis    Poor circulation     Past Surgical History:  Procedure Laterality Date   CARPAL TUNNEL RELEASE Bilateral    COLONOSCOPY     around age 33. [facility] Dr Larene said they removed polyps   ESOPHAGOGASTRODUODENOSCOPY N/A 06/25/2024   Procedure: EGD (ESOPHAGOGASTRODUODENOSCOPY);  Surgeon: Candace Howard., MD;  Location: [facility];  Service: Gastroenterology;  Laterality: N/A;   EUS N/A 06/25/2024   Procedure: ULTRASOUND, UPPER GI TRACT, ENDOSCOPIC;  Surgeon: Candace Howard., MD;  Location: [facility];  Service: Gastroenterology;  Laterality: N/A;   SKIN GRAFT Bilateral     Family History  Problem Relation Age of Onset   Colon cancer Mother    Bladder Cancer Mother        initially and spreaded to the colon and all over per patient   COPD Mother    Glaucoma Mother    Irritable bowel syndrome Mother    Heart disease Father    Hypertension Father    Diabetes Father    Diabetes Paternal Grandmother     Social History Reviewed with no changes to be made today.   Outpatient Medications Prior to Visit  Medication Sig Dispense Refill   albuterol  (VENTOLIN  HFA) 108 (90 Base) MCG/ACT inhaler Inhale 2 puffs into the lungs every 6 (six) hours as needed for wheezing or shortness of breath.     gabapentin  (NEURONTIN ) 100 MG capsule Take 1 capsule (100 mg total) by mouth at bedtime. 90 capsule 3   risperiDONE  (RISPERDAL ) 3 MG tablet Take 1 tablet (3 mg total) by mouth daily. 30 tablet 3   traZODone  (DESYREL ) 150 MG tablet Take 1 tablet (150 mg total) by mouth at bedtime as needed. 30 tablet 3   Accu-Chek Softclix Lancets lancets Use as instructed (Patient not taking: Reported on 11/13/2024) 100 each  12   Blood Glucose Monitoring Suppl (ACCU-CHEK GUIDE) w/Device KIT use as directed once daily (Patient not taking: Reported on 11/13/2024) 1 kit 0   Blood Glucose Monitoring Suppl (TRUE METRIX METER) w/Device KIT Use to check blood sugar 2 (two) times daily. (Patient not taking: Reported on 11/13/2024) 1 kit 0   glucose blood (ACCU-CHEK GUIDE) test strip use  2 (two) times daily as instructed (Patient not taking: Reported on 11/13/2024) 100 each 12   glucose blood (TRUE METRIX BLOOD GLUCOSE TEST) test strip Use as instructed (Patient not taking: Reported on 11/13/2024) 100 each 12   hydrocortisone  (ANUSOL -HC) 2.5 % rectal cream Place 1 Application rectally 2 (two) times daily. 30 g 1   lipase/protease/amylase (CREON ) 36000 UNITS CPEP capsule Take 2 capsules (72,000 Units total) by mouth 3 (three) times daily with meals. May also take 1 capsule (36,000 Units total) as needed (with snacks - up to 4 snacks daily). (Patient not taking: No sig reported)     nystatin  (MYCOSTATIN ) 100000 UNIT/ML suspension Take 5 mLs (  500,000 Units total) by mouth 4 (four) times daily. 120 mL 0   risperiDONE  (RISPERDAL ) 0.5 MG tablet Take 1 tablet (0.5 mg total) by mouth 2 (two) times daily as needed. (Patient not taking: Reported on 11/13/2024) 60 tablet 3   TRUEplus Lancets 28G MISC Use to check blood sugar 2 (two) times daily. (Patient not taking: Reported on 11/13/2024) 100 each 3   benzonatate  (TESSALON ) 100 MG capsule Take 1 capsule (100 mg total) by mouth every 8 (eight) hours. (Patient not taking: Reported on 11/13/2024) 21 capsule 0   budesonide -formoterol  (SYMBICORT ) 160-4.5 MCG/ACT inhaler Inhale 2 puffs into the lungs 2 (two) times daily. (Patient not taking: Reported on 11/13/2024) 10.2 g 3   Continuous Glucose Sensor (FREESTYLE LIBRE 3 SENSOR) MISC Place 1 sensor on the skin every 14 days. Use to check glucose continuously 3 each 12   ibuprofen  (ADVIL ) 800 MG tablet Take 1 tablet (800 mg total) by mouth every 6  (six) hours for mild pain/inflammation. (Patient not taking: Reported on 11/13/2024) 30 tablet 1   metFORMIN  (GLUCOPHAGE -XR) 500 MG 24 hr tablet Take 1 tablet (500 mg total) by mouth 2 (two) times daily with a meal. (Patient not taking: Reported on 06/21/2024) 180 tablet 0   nicotine  (NICODERM CQ ) 21 mg/24hr patch Place 1 patch (21 mg total) onto the skin daily. (Patient not taking: Reported on 11/13/2024) 28 patch 3   pantoprazole  (PROTONIX ) 40 MG tablet Take 1 tablet (40 mg total) by mouth daily. (Patient not taking: Reported on 11/13/2024) 30 tablet 6   PARoxetine  Mesylate 7.5 MG CAPS Take 1 capsule by mouth daily. 90 capsule 0   No facility-administered medications prior to visit.    Allergies  Allergen Reactions   Hydrocodone  Other (See Comments)    Other reaction(s): Unknown/See Comments Feel really high       Objective:    BP 108/68 (BP Location: Left Arm, Patient Position: Sitting, Cuff Size: Normal)   Pulse 84   Resp 18   Ht 5' 4 (1.626 m)   Wt 133 lb 6.4 oz (60.5 kg)   SpO2 98%   BMI 22.90 kg/m  Wt Readings from Last 3 Encounters:  11/13/24 133 lb 6.4 oz (60.5 kg)  06/25/24 143 lb (64.9 kg)  02/29/24 143 lb (64.9 kg)    Physical Exam Vitals and nursing note reviewed.  Constitutional:      Appearance: She is well-developed.  HENT:     Head: Normocephalic and atraumatic.  Cardiovascular:     Rate and Rhythm: Normal rate and regular rhythm.     Heart sounds: Normal heart sounds. No murmur heard.    No friction rub. No gallop.  Pulmonary:     Effort: Pulmonary effort is normal. No tachypnea or respiratory distress.     Breath sounds: Normal breath sounds. No decreased breath sounds, wheezing, rhonchi or rales.  Chest:     Chest wall: No tenderness.  Musculoskeletal:        General: Normal range of motion.     Cervical back: Normal range of motion.  Skin:    General: Skin is warm and dry.  Neurological:     Mental Status: She is alert and oriented to  person, place, and time.     Coordination: Coordination normal.  Psychiatric:        Behavior: Behavior normal. Behavior is cooperative.        Thought Content: Thought content normal.        Judgment: Judgment normal.  Patient has been counseled extensively about nutrition and exercise as well as the importance of adherence with medications and regular follow-up. The patient was given clear instructions to go to ER or return to medical center if symptoms don't improve, worsen or new problems develop. The patient verbalized understanding.   Follow-up: Return for see luke for liberate study. See Howard in 3 months.   Haze LELON Servant, FNP-BC [facility] [place], [place] [phone]   11/13/2024, 2:27 PM

## 2024-11-15 LAB — CBC WITH DIFFERENTIAL/PLATELET

## 2024-11-18 ENCOUNTER — Ambulatory Visit: Payer: Self-pay | Admitting: Nurse Practitioner

## 2024-11-20 ENCOUNTER — Other Ambulatory Visit (HOSPITAL_COMMUNITY): Payer: Self-pay

## 2024-11-20 ENCOUNTER — Telehealth (INDEPENDENT_AMBULATORY_CARE_PROVIDER_SITE_OTHER): Admitting: Psychiatry

## 2024-11-20 ENCOUNTER — Other Ambulatory Visit: Payer: Self-pay

## 2024-11-20 ENCOUNTER — Encounter (HOSPITAL_COMMUNITY): Payer: Self-pay | Admitting: Psychiatry

## 2024-11-20 DIAGNOSIS — F3160 Bipolar disorder, current episode mixed, unspecified: Secondary | ICD-10-CM | POA: Diagnosis not present

## 2024-11-20 DIAGNOSIS — F411 Generalized anxiety disorder: Secondary | ICD-10-CM | POA: Diagnosis not present

## 2024-11-20 MED ORDER — TRAZODONE HCL 150 MG PO TABS
150.0000 mg | ORAL_TABLET | Freq: Every evening | ORAL | 3 refills | Status: DC | PRN
  Filled 2024-11-20: qty 30, 30d supply, fill #0
  Filled 2024-12-04 – 2025-01-04 (×2): qty 30, 30d supply, fill #1

## 2024-11-20 MED ORDER — GABAPENTIN 100 MG PO CAPS
100.0000 mg | ORAL_CAPSULE | Freq: Every day | ORAL | 3 refills | Status: DC
  Filled 2024-11-20: qty 90, 90d supply, fill #0
  Filled 2024-11-30 (×3): qty 30, 30d supply, fill #0
  Filled 2024-12-24 – 2024-12-28 (×2): qty 30, 30d supply, fill #1
  Filled 2025-01-24: qty 30, 30d supply, fill #2

## 2024-11-20 MED ORDER — RISPERIDONE 3 MG PO TABS
3.0000 mg | ORAL_TABLET | Freq: Every day | ORAL | 3 refills | Status: DC
  Filled 2024-11-20 – 2024-12-17 (×2): qty 30, 30d supply, fill #0
  Filled 2025-01-12: qty 30, 30d supply, fill #1

## 2024-11-20 NOTE — Progress Notes (Signed)
 [facility] MD/PA/NP OP Progress Note Virtual Visit via Video Note  I connected with Candace Howard on 11/20/24 at 11:30 AM EST by a video enabled telemedicine application and verified that I am speaking with the correct person using two identifiers.  Location: Patient: Work Provider: Clinic   I discussed the limitations of evaluation and management by telemedicine and the availability of in person appointments. The patient expressed understanding and agreed to proceed.  I provided 30 minutes of non-face-to-face time during this encounter.   11/20/2024 9:40 AM Candace Howard  MRN:  [id number]  Chief Complaint: I am a peer support specialist   HPI: 53 year old female seen today for follow-up psychiatric evaluation.  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  Currently she is being managed on Risperdal  0.5 twice daily as needed, Risperdal  3 mg nightly, gabapentin  100 mg 3 times daily (reports that she became addicted to higher doses), and trazodone  150 mg nightly as needed.  She informed clinical research associate that she is not taking her as needed Risperdal .  Today she reports her medications are effective in managing her psychiatric condition.  Today she was well-groomed, pleasant, cooperative, and engaged in conversation.  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 She notes that she enjoys her job.  Mentally patient notes that she is in a good place. She notes her mood is stable and notes that she has minimal anxiety and depression.  Today provider conducted a GAD 7 and patient scored an 8. Provider also conducted a PHQ 9 and patient scored a 5. She endorses adequate sleep and appetite.    Patient reports that she continues to have a chronic cough.  She notes that she was started on Protonix   but notes that it has not been sufficient.  She continues to smoke cigarettes.  Provider informed patient that this is likely because of smoking.  At this time she is not interested in Nicorette  patch or gum.   At this time Risperdal  0.5 mg twice daily as needed discontinued. She will continue all other medications as prescribed. No other concerns at this time.    Visit Diagnosis:    ICD-10-CM   1. Anxiety state  F41.1 risperiDONE  (RISPERDAL ) 3 MG tablet    gabapentin  (NEURONTIN ) 100 MG capsule    2. Bipolar affective disorder, current episode mixed, without psychotic features (HCC)  F31.60 risperiDONE  (RISPERDAL ) 3 MG tablet    gabapentin  (NEURONTIN ) 100 MG capsule           Past Psychiatric History: substance use (Alcohol, Tobacco, meth, and cocaine) Bipolar affective disorder, Depression, anxiety, and PTSD  Past Medical History:  Past Medical History:  Diagnosis Date   Anemia    Anxiety    Arthritis    Asthma    Chest pain    COPD (chronic obstructive pulmonary disease) (HCC)    Depression    Difficulty breathing    Frequent headaches    History of hepatitis C    IBS (irritable bowel syndrome)    Palpitations    Pancreatitis    Poor circulation     Past Surgical History:  Procedure Laterality Date   CARPAL TUNNEL RELEASE Bilateral    COLONOSCOPY     around age 67. [facility] Dr Larene said they removed polyps   ESOPHAGOGASTRODUODENOSCOPY N/A  06/25/2024   Procedure: EGD (ESOPHAGOGASTRODUODENOSCOPY);  Surgeon: Wilhelmenia Aloha Raddle., MD;  Location: [facility];  Service: Gastroenterology;  Laterality: N/A;   EUS N/A 06/25/2024   Procedure: ULTRASOUND, UPPER GI TRACT, ENDOSCOPIC;  Surgeon: Wilhelmenia Aloha Raddle., MD;  Location: [facility];  Service: Gastroenterology;  Laterality: N/A;   SKIN GRAFT Bilateral     Family Psychiatric History:  Son Substance use, maternal 4 uncles SI, mother abuse of pill use   Family History:  Family History  Problem  Relation Age of Onset   Colon cancer Mother    Bladder Cancer Mother        initially and spreaded to the colon and all over per patient   COPD Mother    Glaucoma Mother    Irritable bowel syndrome Mother    Heart disease Father    Hypertension Father    Diabetes Father    Diabetes Paternal Grandmother     Social History:  Social History   Socioeconomic History   Marital status: Single    Spouse name: Not on file   Number of children: Not on file   Years of education: Not on file   Highest education level: Not on file  Occupational History   Not on file  Tobacco Use   Smoking status: Every Day    Current packs/day: 0.50    Average packs/day: 0.5 packs/day for 35.0 years (17.5 ttl pk-yrs)    Types: Cigarettes   Smokeless tobacco: Never   Tobacco comments:    Pt states she started back smoking 1 month ago and is smoking 1 ppd. ALS 03/31/23  Vaping Use   Vaping status: Never Used  Substance and Sexual Activity   Alcohol use: Not Currently    Comment: 2 months sober.   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   Sexual activity: Not Currently  Other Topics Concern   Not on file  Social History Narrative   Not on file   Social Drivers of Health   Financial Resource Strain: High Risk (01/11/2022)   Overall Financial Resource Strain (CARDIA)    Difficulty of Paying Living Expenses: Hard  Food Insecurity: Low Risk  (04/28/2023)   Received from [facility]   Hunger Vital Sign    Within the past 12 months, you worried that your food would run out before you got money to buy more: Never true    Within the past 12 months, the food you bought just didn't last and you didn't have money to get more. : Never true  Transportation Needs: Not on file (04/28/2023)  Physical Activity: Inactive (01/11/2022)   Exercise Vital Sign    Days of Exercise per Week: 0 days    Minutes of Exercise per Session: 0 min  Stress: Stress Concern Present (01/11/2022)    [employer] of Occupational Health - Occupational Stress Questionnaire    Feeling of Stress : Very much  Social Connections: Socially Isolated (01/11/2022)   Social Connection and Isolation Panel    Frequency of Communication with Friends and Family: More than three times a week    Frequency of Social Gatherings with Friends and Family: Three times a week    Attends Religious Services: Never    Active Member of Clubs or Organizations: No    Attends Banker Meetings: Never    Marital Status: Never married    Allergies:  Allergies  Allergen Reactions   Hydrocodone  Other (See Comments)  Other reaction(s): Unknown/See Comments Feel really high    Metabolic Disorder Labs: Lab Results  Component Value Date   HGBA1C 7.7 (A) 11/13/2024   MPG 108.28 12/25/2021   No results found for: PROLACTIN Lab Results  Component Value Date   CHOL 153 11/13/2024   TRIG 155 (H) 11/13/2024   HDL 31 (L) 11/13/2024   CHOLHDL 4.9 (H) 11/13/2024   VLDL 23 12/27/2021   LDLCALC 95 11/13/2024   LDLCALC 110 (H) 12/27/2021   Lab Results  Component Value Date   TSH 0.636 12/25/2021    Therapeutic Level Labs: No results found for: LITHIUM No results found for: VALPROATE No results found for: CBMZ  Current Medications: Current Outpatient Medications  Medication Sig Dispense Refill   Accu-Chek Softclix Lancets lancets Use as instructed (Patient not taking: Reported on 11/13/2024) 100 each 12   albuterol  (VENTOLIN  HFA) 108 (90 Base) MCG/ACT inhaler Inhale 2 puffs into the lungs every 6 (six) hours as needed for wheezing or shortness of breath.     Blood Glucose Monitoring Suppl (ACCU-CHEK GUIDE) w/Device KIT use as directed once daily (Patient not taking: Reported on 11/13/2024) 1 kit 0   Blood Glucose Monitoring Suppl (TRUE METRIX METER) w/Device KIT Use to check blood sugar 2 (two) times daily. (Patient not taking: Reported on 11/13/2024) 1 kit 0   budesonide -formoterol   (SYMBICORT ) 160-4.5 MCG/ACT inhaler Inhale 2 puffs into the lungs 2 (two) times daily. 10.2 g 3   empagliflozin  (JARDIANCE ) 10 MG TABS tablet Take 1 tablet (10 mg total) by mouth daily. For diabetes 90 tablet 1   gabapentin  (NEURONTIN ) 100 MG capsule Take 1 capsule (100 mg total) by mouth at bedtime. 90 capsule 3   glucose blood (ACCU-CHEK GUIDE) test strip use  2 (two) times daily as instructed (Patient not taking: Reported on 11/13/2024) 100 each 12   glucose blood (TRUE METRIX BLOOD GLUCOSE TEST) test strip Use as instructed (Patient not taking: Reported on 11/13/2024) 100 each 12   hydrocortisone  (ANUSOL -HC) 2.5 % rectal cream Place 1 Application rectally 2 (two) times daily. 30 g 1   lipase/protease/amylase (CREON ) 36000 UNITS CPEP capsule Take 2 capsules (72,000 Units total) by mouth 3 (three) times daily with meals. May also take 1 capsule (36,000 Units total) as needed (with snacks - up to 4 snacks daily). (Patient not taking: No sig reported)     nystatin  (MYCOSTATIN ) 100000 UNIT/ML suspension Take 5 mLs (500,000 Units total) by mouth 4 (four) times daily. 120 mL 0   pantoprazole  (PROTONIX ) 40 MG tablet Take 1 tablet (40 mg total) by mouth daily. 90 tablet 1   risperiDONE  (RISPERDAL ) 0.5 MG tablet Take 1 tablet (0.5 mg total) by mouth 2 (two) times daily as needed. (Patient not taking: Reported on 11/13/2024) 60 tablet 3   risperiDONE  (RISPERDAL ) 3 MG tablet Take 1 tablet (3 mg total) by mouth daily. 30 tablet 3   traZODone  (DESYREL ) 150 MG tablet Take 1 tablet (150 mg total) by mouth at bedtime as needed. 30 tablet 3   TRUEplus Lancets 28G MISC Use to check blood sugar 2 (two) times daily. (Patient not taking: Reported on 11/13/2024) 100 each 3   No current facility-administered medications for this visit.     Musculoskeletal: Strength & Muscle Tone: within normal limits telehealth visit Gait & Station: normal telehealth visit Patient leans: N/A  Psychiatric Specialty Exam: Review  of Systems  There were no vitals taken for this visit.There is no height or weight on file to calculate  BMI.  General Appearance: Well Groomed  Eye Contact:  Good  Speech:  Clear and Coherent and Normal Rate  Volume:  Normal  Mood:  Euthymic  Affect:  Appropriate and Congruent  Thought Process:  Coherent, Goal Directed, and Linear  Orientation:  Full (Time, Place, and Person)  Thought Content: WDL   Suicidal Thoughts:  No  Homicidal Thoughts:  No  Memory:  Immediate;   Good Recent;   Good Remote;   Good  Judgement:  Good  Insight:  Good  Psychomotor Activity:  Normal  Concentration:  Concentration: Good and Attention Span: Good  Recall:  Good  Fund of Knowledge: Good  Language: Good  Akathisia:  No  Handed:  Left  AIMS (if indicated): not done  Assets:  Communication Skills Desire for Improvement Financial Resources/Insurance Housing Physical Health Social Support  ADL's:  Intact  Cognition: WNL  Sleep:  Good   Screenings: AIMS    Flowsheet Row Clinical Support from 01/11/2022 in [facility]  AIMS Total Score 0 0   AUDIT    Flowsheet Row Ad[facility] (Discharged[facility]  Alcohol Use Disorder Identification Test Final Score (AUDIT) 0   GAD-7    Flowsheet Row Video Visit from 11/20/2024 in [facility] Video Visit from 05/30/2024 in [facility] Video Visit from 04/11/2024 in [facility] Video Visit from 01/18/2024 in [facility] Video Visit from 11/02/2023 in [facility]  Total GAD-7 Score 8 14 8 6 6    PHQ2-9    Flowsheet Row Video Visit from 11/20/2024 in [facility] Video Visit from 05/30/2024 in [facility] Video Visit from 04/11/2024 in [facility] Video Visit from 01/18/2024 in [facility] Video Visit from 11/02/2023 in [facility]  PHQ-2 Total Score 0 3 1 0 2  PHQ-9 Total Score 5 15 3 1 10    Flowsheet Row Video Visit from 05/30/2024 in [facility] ED from 11/16/2023 in [facility] Emergency Department at [facility] Office Visit from 02/28/2023 in [facility]  C-SSRS RISK CATEGORY Error: Q7 should not be populated when Q6 is No No Risk Low Risk     Assessment and Plan: Patient informed writer that her mood, anxiety, and depression has improved.She has not been taking as needed Risperdal . At this time Risperdal  0.5 mg twice daily as needed discontinued. She will continue all other medications as prescribed.  1. Anxiety state  Continue- risperiDONE  (RISPERDAL ) 3 MG tablet; Take 1 tablet (3 mg total) by mouth daily.  Dispense: 30 tablet; Refill: 3 Continue- gabapentin  (NEURONTIN ) 100 MG capsule; Take 1 capsule (100 mg total) by mouth at bedtime.  Dispense: 90 capsule; Refill: 3  2. Bipolar affective disorder, current episode mixed, without psychotic features (HCC)  Continue- risperiDONE  (RISPERDAL ) 3 MG tablet; Take 1 tablet (3 mg total) by mouth daily.  Dispense: 30 tablet; Refill: 3 Continue- gabapentin  (NEURONTIN ) 100 MG capsule; Take 1 capsule (100 mg total) by mouth at bedtime.  Dispense: 90 capsule; Refill: 3  Collaboration of Care: Collaboration of Care: Other provider involved in patient's care AEB PCP  Patient/Guardian was advised Release of Information must be obtained prior to any record release in order to collaborate  their care with an outside provider. Patient/Guardian was advised if they have not already done so to contact the registration department to sign all necessary forms in order for us  to release information regarding  their care.   Consent: Patient/Guardian gives verbal consent for treatment and assignment of benefits for services provided during this visit. Patient/Guardian expressed understanding and agreed to proceed.   Follow-up in 2.5 months Zane FORBES Bach, NP 11/20/2024, 9:40 AM

## 2024-11-21 ENCOUNTER — Telehealth: Payer: Self-pay | Admitting: Family Medicine

## 2024-11-21 NOTE — Telephone Encounter (Signed)
 Lvm to confirm appt for 12/4

## 2024-11-22 ENCOUNTER — Ambulatory Visit: Attending: Family Medicine | Admitting: Pharmacist

## 2024-11-22 ENCOUNTER — Encounter: Payer: Self-pay | Admitting: Pharmacist

## 2024-11-22 DIAGNOSIS — E1169 Type 2 diabetes mellitus with other specified complication: Secondary | ICD-10-CM

## 2024-11-22 DIAGNOSIS — Z7984 Long term (current) use of oral hypoglycemic drugs: Secondary | ICD-10-CM | POA: Diagnosis not present
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
## 2024-11-26 LAB — CBC WITH DIFFERENTIAL/PLATELET

## 2024-11-26 LAB — CMP14+EGFR
ALT: 16 IU/L (ref 0–32)
AST: 20 IU/L (ref 0–40)
Albumin: 4.3 g/dL (ref 3.8–4.9)
Alkaline Phosphatase: 69 IU/L (ref 49–135)
BUN/Creatinine Ratio: 5 — ABNORMAL LOW (ref 9–23)
BUN: 4 mg/dL — ABNORMAL LOW (ref 6–24)
Bilirubin Total: 0.3 mg/dL (ref 0.0–1.2)
CO2: 20 mmol/L (ref 20–29)
Calcium: 9 mg/dL (ref 8.7–10.2)
Chloride: 94 mmol/L — ABNORMAL LOW (ref 96–106)
Creatinine, Ser: 0.81 mg/dL (ref 0.57–1.00)
Globulin, Total: 2.5 g/dL (ref 1.5–4.5)
Glucose: 198 mg/dL — ABNORMAL HIGH (ref 70–99)
Potassium: 4.4 mmol/L (ref 3.5–5.2)
Sodium: 134 mmol/L (ref 134–144)
Total Protein: 6.8 g/dL (ref 6.0–8.5)
eGFR: 88 mL/min/1.73 (ref >59)

## 2024-11-26 LAB — LIPID PANEL
Chol/HDL Ratio: 4.9 ratio — ABNORMAL HIGH (ref 0.0–4.4)
Cholesterol, Total: 153 mg/dL (ref 100–199)
HDL: 31 mg/dL — ABNORMAL LOW (ref >39)
LDL Chol Calc (NIH): 95 mg/dL (ref 0–99)
Triglycerides: 155 mg/dL — ABNORMAL HIGH (ref 0–149)
VLDL Cholesterol Cal: 27 mg/dL (ref 5–40)

## 2024-11-26 LAB — MICROALBUMIN / CREATININE URINE RATIO
Creatinine, Urine: 85.1 mg/dL
Microalb/Creat Ratio: 9 mg/g{creat} (ref 0–29)
Microalbumin, Urine: 7.9 ug/mL

## 2024-11-27 ENCOUNTER — Other Ambulatory Visit (HOSPITAL_COMMUNITY): Payer: Self-pay

## 2024-11-28 ENCOUNTER — Other Ambulatory Visit (HOSPITAL_COMMUNITY): Payer: Self-pay

## 2024-11-30 ENCOUNTER — Emergency Department (HOSPITAL_COMMUNITY)
Admission: EM | Admit: 2024-11-30 | Discharge: 2024-12-01 | Disposition: A | Attending: Emergency Medicine | Admitting: Emergency Medicine

## 2024-11-30 ENCOUNTER — Emergency Department (HOSPITAL_COMMUNITY)

## 2024-11-30 ENCOUNTER — Other Ambulatory Visit (HOSPITAL_COMMUNITY): Payer: Self-pay

## 2024-11-30 DIAGNOSIS — H53411 Scotoma involving central area, right eye: Secondary | ICD-10-CM

## 2024-11-30 DIAGNOSIS — G43109 Migraine with aura, not intractable, without status migrainosus: Secondary | ICD-10-CM

## 2024-11-30 DIAGNOSIS — I6523 Occlusion and stenosis of bilateral carotid arteries: Secondary | ICD-10-CM | POA: Diagnosis not present

## 2024-11-30 DIAGNOSIS — I10 Essential (primary) hypertension: Secondary | ICD-10-CM | POA: Diagnosis not present

## 2024-11-30 DIAGNOSIS — E871 Hypo-osmolality and hyponatremia: Secondary | ICD-10-CM | POA: Diagnosis not present

## 2024-11-30 DIAGNOSIS — R29818 Other symptoms and signs involving the nervous system: Secondary | ICD-10-CM | POA: Diagnosis not present

## 2024-11-30 DIAGNOSIS — H53451 Other localized visual field defect, right eye: Secondary | ICD-10-CM | POA: Diagnosis not present

## 2024-11-30 DIAGNOSIS — R519 Headache, unspecified: Secondary | ICD-10-CM | POA: Diagnosis not present

## 2024-11-30 DIAGNOSIS — R4182 Altered mental status, unspecified: Secondary | ICD-10-CM | POA: Diagnosis not present

## 2024-11-30 DIAGNOSIS — R410 Disorientation, unspecified: Secondary | ICD-10-CM | POA: Diagnosis not present

## 2024-11-30 DIAGNOSIS — H539 Unspecified visual disturbance: Secondary | ICD-10-CM | POA: Diagnosis not present

## 2024-11-30 LAB — COMPREHENSIVE METABOLIC PANEL WITH GFR
ALT: 12 U/L (ref 0–44)
AST: 21 U/L (ref 15–41)
Albumin: 5 g/dL (ref 3.5–5.0)
Alkaline Phosphatase: 95 U/L (ref 38–126)
Anion gap: 13 (ref 5–15)
BUN: <5 mg/dL — ABNORMAL LOW (ref 6–20)
CO2: 26 mmol/L (ref 22–32)
Calcium: 9.6 mg/dL (ref 8.9–10.3)
Chloride: 92 mmol/L — ABNORMAL LOW (ref 98–111)
Creatinine, Ser: 0.67 mg/dL (ref 0.44–1.00)
GFR, Estimated: >60 mL/min (ref >60)
Glucose, Bld: 105 mg/dL — ABNORMAL HIGH (ref 70–99)
Potassium: 3.8 mmol/L (ref 3.5–5.1)
Sodium: 131 mmol/L — ABNORMAL LOW (ref 135–145)
Total Bilirubin: 0.4 mg/dL (ref 0.0–1.2)
Total Protein: 8.8 g/dL — ABNORMAL HIGH (ref 6.5–8.1)

## 2024-11-30 LAB — URINALYSIS, ROUTINE W REFLEX MICROSCOPIC
Bilirubin Urine: NEGATIVE
Glucose, UA: NEGATIVE mg/dL
Hgb urine dipstick: NEGATIVE
Ketones, ur: NEGATIVE mg/dL
Leukocytes,Ua: NEGATIVE
Nitrite: NEGATIVE
Protein, ur: NEGATIVE mg/dL
Specific Gravity, Urine: 1.001 — ABNORMAL LOW (ref 1.005–1.030)
pH: 6 (ref 5.0–8.0)

## 2024-11-30 LAB — CBC
HCT: 48.3 % — ABNORMAL HIGH (ref 36.0–46.0)
Hemoglobin: 16.7 g/dL — ABNORMAL HIGH (ref 12.0–15.0)
MCH: 30.9 pg (ref 26.0–34.0)
MCHC: 34.6 g/dL (ref 30.0–36.0)
MCV: 89.3 fL (ref 80.0–100.0)
Platelets: 363 K/uL (ref 150–400)
RBC: 5.41 MIL/uL — ABNORMAL HIGH (ref 3.87–5.11)
RDW: 11.9 % (ref 11.5–15.5)
WBC: 10.2 K/uL (ref 4.0–10.5)
nRBC: 0 % (ref 0.0–0.2)

## 2024-11-30 LAB — I-STAT CHEM 8, ED
BUN: <3 mg/dL — ABNORMAL LOW (ref 6–20)
Calcium, Ion: 1.03 mmol/L — ABNORMAL LOW (ref 1.15–1.40)
Chloride: 94 mmol/L — ABNORMAL LOW (ref 98–111)
Creatinine, Ser: 0.7 mg/dL (ref 0.44–1.00)
Glucose, Bld: 105 mg/dL — ABNORMAL HIGH (ref 70–99)
HCT: 53 % — ABNORMAL HIGH (ref 36.0–46.0)
Hemoglobin: 18 g/dL — ABNORMAL HIGH (ref 12.0–15.0)
Potassium: 3.8 mmol/L (ref 3.5–5.1)
Sodium: 132 mmol/L — ABNORMAL LOW (ref 135–145)
TCO2: 25 mmol/L (ref 22–32)

## 2024-11-30 LAB — CBG MONITORING, ED: Glucose-Capillary: 111 mg/dL — ABNORMAL HIGH (ref 70–99)

## 2024-11-30 LAB — TROPONIN T, HIGH SENSITIVITY: Troponin T High Sensitivity: <15 ng/L (ref 0–19)

## 2024-11-30 MED ORDER — KETOROLAC TROMETHAMINE 15 MG/ML IJ SOLN
15.0000 mg | Freq: Once | INTRAMUSCULAR | Status: AC
  Administered 2024-11-30: 15 mg via INTRAVENOUS
  Filled 2024-11-30: qty 1

## 2024-11-30 MED ORDER — DIPHENHYDRAMINE HCL 25 MG PO CAPS
25.0000 mg | ORAL_CAPSULE | Freq: Once | ORAL | Status: AC
  Administered 2024-11-30: 25 mg via ORAL
  Filled 2024-11-30: qty 1

## 2024-11-30 MED ORDER — SODIUM CHLORIDE 0.9 % IV BOLUS
1000.0000 mL | Freq: Once | INTRAVENOUS | Status: AC
  Administered 2024-11-30: 1000 mL via INTRAVENOUS

## 2024-11-30 MED ORDER — IOHEXOL 350 MG/ML SOLN
75.0000 mL | Freq: Once | INTRAVENOUS | Status: AC | PRN
  Administered 2024-11-30: 75 mL via INTRAVENOUS

## 2024-11-30 MED ORDER — METOCLOPRAMIDE HCL 5 MG/ML IJ SOLN
10.0000 mg | Freq: Once | INTRAMUSCULAR | Status: AC
  Administered 2024-11-30: 10 mg via INTRAVENOUS
  Filled 2024-11-30: qty 2

## 2024-11-30 MED ORDER — DEXAMETHASONE SOD PHOSPHATE PF 10 MG/ML IJ SOLN
8.0000 mg | Freq: Once | INTRAMUSCULAR | Status: AC
  Administered 2024-11-30: 8 mg via INTRAVENOUS

## 2024-11-30 MED ORDER — MAGNESIUM SULFATE 2 GM/50ML IV SOLN
2.0000 g | Freq: Once | INTRAVENOUS | Status: AC
  Administered 2024-11-30: 2 g via INTRAVENOUS
  Filled 2024-11-30: qty 50

## 2024-11-30 NOTE — Discharge Instructions (Addendum)
 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  Your CT scan was reassuring.  Tylenol  and ibuprofen  at home and follow-up with neurology.   Please return to emergency room for new or concerning symptoms such as shoulder pain, confusion, limb weakness.  Isolated vision changes in the right eye with stated headache is consistent with a visual aura for migraine.

## 2024-11-30 NOTE — ED Triage Notes (Signed)
 Patient in today reporting sudden onset of right side facial tingling and numbness with right eye vision loss that lasted 15 min.

## 2024-11-30 NOTE — ED Provider Notes (Signed)
 [place] [facility]ENC[facility] DEPARTMENT AT [facility] Provider Note   CSN: [id number] Arrival date & time: 11/30/24  1845     Patient presents with: Loss of Vision, Numbness, and Tingling   Candace Howard is a 53 y.o. female.   HPI  Patient is a 53 year old female with past medical history significant for headaches she states she has a history of headaches with visual aura she states today she had an area of blurred vision in her right eye with onset of right-sided headache.  She denies any slurred speech, limb weakness or facial droop.  She states she felt a little disoriented during her headache.  She has taken no medications for her symptoms.  Denies any vertigo or vomiting. Does endorse some nausea.        Prior to Admission medications  Medication Sig Start Date End Date Taking? Authorizing Provider  Accu-Chek Softclix Lancets lancets Use as instructed Patient not taking: Reported on 11/13/2024 04/01/22   Mayers, Cari S, PA-C  albuterol  (VENTOLIN  HFA) 108 (90 Base) MCG/ACT inhaler Inhale 2 puffs into the lungs every 6 (six) hours as needed for wheezing or shortness of breath. 11/16/23   Raford Lenis, MD  Blood Glucose Monitoring Suppl (ACCU-CHEK GUIDE) w/Device KIT use as directed once daily Patient not taking: Reported on 11/13/2024 05/20/23   Tanda Bleacher, MD  Blood Glucose Monitoring Suppl (TRUE METRIX METER) w/Device KIT Use to check blood sugar 2 (two) times daily. Patient not taking: Reported on 11/13/2024 08/08/23   McClung, Angela M, PA-C  budesonide -formoterol  (SYMBICORT ) 160-4.5 MCG/ACT inhaler Inhale 2 puffs into the lungs 2 (two) times daily. 11/13/24   Fleming, Zelda W, NP  empagliflozin  (JARDIANCE ) 10 MG TABS tablet Take 1 tablet (10 mg total) by mouth daily. For diabetes 11/13/24   Theotis Haze ORN, NP  gabapentin  (NEURONTIN ) 100 MG capsule Take 1 capsule (100 mg total) by mouth at bedtime. 11/20/24   Harl Zane BRAVO, NP  glucose blood (ACCU-CHEK GUIDE)  test strip use  2 (two) times daily as instructed Patient not taking: Reported on 11/13/2024 05/20/23   Tanda Bleacher, MD  glucose blood (TRUE METRIX BLOOD GLUCOSE TEST) test strip Use as instructed Patient not taking: Reported on 11/13/2024 08/08/23   Danton Jon HERO, PA-C  hydrocortisone  (ANUSOL -HC) 2.5 % rectal cream Place 1 Application rectally 2 (two) times daily. 02/29/24   Federico Rosario BROCKS, MD  lipase/protease/amylase (CREON ) 36000 UNITS CPEP capsule Take 2 capsules (72,000 Units total) by mouth 3 (three) times daily with meals. May also take 1 capsule (36,000 Units total) as needed (with snacks - up to 4 snacks daily). Patient not taking: No sig reported 06/25/24   Mansouraty, Aloha Raddle., MD  nystatin  (MYCOSTATIN ) 100000 UNIT/ML suspension Take 5 mLs (500,000 Units total) by mouth 4 (four) times daily. 01/18/24   Frann Mabel Mt, DO  pantoprazole  (PROTONIX ) 40 MG tablet Take 1 tablet (40 mg total) by mouth daily. 11/13/24   Fleming, Zelda W, NP  risperiDONE  (RISPERDAL ) 0.5 MG tablet Take 1 tablet (0.5 mg total) by mouth 2 (two) times daily as needed. Patient not taking: Reported on 11/13/2024 08/16/24   Wilkie Majel RAMAN, FNP  risperiDONE  (RISPERDAL ) 3 MG tablet Take 1 tablet (3 mg total) by mouth daily. 11/20/24   Harl Zane BRAVO, NP  traZODone  (DESYREL ) 150 MG tablet Take 1 tablet (150 mg total) by mouth at bedtime as needed. 11/20/24   Harl Zane BRAVO, NP  TRUEplus Lancets 28G MISC Use to check blood sugar 2 (  two) times daily. Patient not taking: Reported on 11/13/2024 08/08/23   Danton Jon HERO, PA-C    Allergies: Hydrocodone     Review of Systems  Updated Vital Signs BP 109/74   Pulse 71   Temp 98.5 F (36.9 C) (Oral)   Resp (!) 27   Ht 5' 4 (1.626 m)   Wt 60.3 kg   SpO2 94%   BMI 22.83 kg/m   Physical Exam Vitals and nursing note reviewed.  Constitutional:      General: She is not in acute distress. HENT:     Head: Normocephalic and atraumatic.      Nose: Nose normal.  Eyes:     General: No scleral icterus. Cardiovascular:     Rate and Rhythm: Normal rate and regular rhythm.     Pulses: Normal pulses.     Heart sounds: Normal heart sounds.  Pulmonary:     Effort: Pulmonary effort is normal. No respiratory distress.     Breath sounds: No wheezing.  Abdominal:     Palpations: Abdomen is soft.     Tenderness: There is no abdominal tenderness.  Musculoskeletal:     Cervical back: Normal range of motion.     Right lower leg: No edema.     Left lower leg: No edema.  Skin:    General: Skin is warm and dry.     Capillary Refill: Capillary refill takes less than 2 seconds.  Neurological:     Mental Status: She is alert. Mental status is at baseline.     Comments: Alert and oriented to self, place, time and event.   Speech is fluent, clear without dysarthria or dysphasia.   Strength 5/5 in upper/lower extremities   Sensation intact in upper/lower extremities   Normal gait.  CN I not tested  CN II grossly intact visual fields bilaterally. Did not visualize posterior eye.  CN III, IV, VI PERRLA and EOMs intact bilaterally  CN V Intact sensation to sharp and light touch to the face  CN VII facial movements symmetric  CN VIII not tested  CN IX, X no uvula deviation, symmetric rise of soft palate  CN XI 5/5 SCM and trapezius strength bilaterally  CN XII Midline tongue protrusion, symmetric L/R movements   Psychiatric:        Mood and Affect: Mood normal.        Behavior: Behavior normal.     (all labs ordered are listed, but only abnormal results are displayed) Labs Reviewed  COMPREHENSIVE METABOLIC PANEL WITH GFR - Abnormal; Notable for the following components:      Result Value   Sodium 131 (*)    Chloride 92 (*)    Glucose, Bld 105 (*)    BUN <5 (*)    Total Protein 8.8 (*)    All other components within normal limits  CBC - Abnormal; Notable for the following components:   RBC 5.41 (*)    Hemoglobin 16.7 (*)     HCT 48.3 (*)    All other components within normal limits  URINALYSIS, ROUTINE W REFLEX MICROSCOPIC - Abnormal; Notable for the following components:   Color, Urine COLORLESS (*)    Specific Gravity, Urine 1.001 (*)    All other components within normal limits  CBG MONITORING, ED - Abnormal; Notable for the following components:   Glucose-Capillary 111 (*)    All other components within normal limits  I-STAT CHEM 8, ED - Abnormal; Notable for the following components:   Sodium  132 (*)    Chloride 94 (*)    BUN <3 (*)    Glucose, Bld 105 (*)    Calcium , Ion 1.03 (*)    Hemoglobin 18.0 (*)    HCT 53.0 (*)    All other components within normal limits  TROPONIN T, HIGH SENSITIVITY    EKG: None  Radiology: CT ANGIO HEAD NECK W WO CM Result Date: 11/30/2024 EXAM: CTA Head and Neck with Intravenous Contrast. CT Head without Contrast. CLINICAL HISTORY: Neuro deficit, acute, stroke suspected; resolved symptoms of R vision change and confusion. TECHNIQUE: Axial CTA images of the head and neck performed with intravenous contrast. MIP reconstructed images were created and reviewed. Axial computed tomography images of the head/brain performed without intravenous contrast. Note: Per PQRS, the description of internal carotid artery percent stenosis, including 0 percent or normal exam, is based on [employer] American Symptomatic Carotid Endarterectomy Trial (NASCET) criteria. Dose reduction technique was used including one or more of the following: automated exposure control, adjustment of mA and kV according to patient size, and/or iterative reconstruction. CONTRAST: With intravenous contrast for the CTA Head and Neck: 75mL iohexol  (OMNIPAQUE ) 350 MG/ML injection. Without intravenous contrast for the CT Head. COMPARISON: None provided. FINDINGS: CT HEAD: BRAIN: No acute intraparenchymal hemorrhage. No mass lesion. No CT evidence for acute territorial infarct. No midline shift or extra-axial collection.  VENTRICLES: No hydrocephalus. ORBITS: The orbits are unremarkable. SINUSES AND MASTOIDS: The paranasal sinuses and mastoid air cells are clear. CTA NECK: COMMON CAROTID ARTERIES: Minimal atherosclerotic calcification at the right carotid bifurcation. Otherwise, the common carotid arteries show no significant stenosis, dissection, or occlusion. INTERNAL CAROTID ARTERIES: Minimal atherosclerotic calcification at the right carotid bifurcation. Otherwise, the internal carotid arteries show no stenosis by NASCET criteria, dissection, or occlusion. VERTEBRAL ARTERIES: No significant stenosis. No dissection or occlusion. CTA HEAD: ANTERIOR CEREBRAL ARTERIES: No significant stenosis. No occlusion. No aneurysm. MIDDLE CEREBRAL ARTERIES: No significant stenosis. No occlusion. No aneurysm. POSTERIOR CEREBRAL ARTERIES: No significant stenosis. No occlusion. No aneurysm. BASILAR ARTERY: No significant stenosis. No occlusion. No aneurysm. OTHER: SOFT TISSUES: No acute finding. No masses or lymphadenopathy. BONES: No acute osseous abnormality. IMPRESSION: 1. No acute intracranial hemorrhage or ischemic change. 2. No significant stenosis, aneurysmal dilatation, or dissection involving the arteries of the head and neck. Electronically signed by: Franky Stanford MD 11/30/2024 10:38 PM EST RP Workstation: [id number]   DG Chest Portable 1 View Result Date: 11/30/2024 CLINICAL DATA:  Altered mental status EXAM: PORTABLE CHEST 1 VIEW COMPARISON:  11/16/2023 FINDINGS: The heart size and mediastinal contours are within normal limits. Both lungs are clear. The visualized skeletal structures are unremarkable. IMPRESSION: No active disease. Electronically Signed   By: Luke Bun M.D.   On: 11/30/2024 21:30     Procedures   Medications Ordered in the ED  magnesium  sulfate IVPB 2 g 50 mL (2 g Intravenous New Bag/Given 11/30/24 2350)  metoCLOPramide  (REGLAN ) injection 10 mg (10 mg Intravenous Given 11/30/24 2056)  diphenhydrAMINE   (BENADRYL ) capsule 25 mg (25 mg Oral Given 11/30/24 2056)  iohexol  (OMNIPAQUE ) 350 MG/ML injection 75 mL (75 mLs Intravenous Contrast Given 11/30/24 2154)  sodium chloride  0.9 % bolus 1,000 mL (1,000 mLs Intravenous New Bag/Given 11/30/24 2331)  dexamethasone  (DECADRON ) injection 8 mg (8 mg Intravenous Given 11/30/24 2345)  ketorolac  (TORADOL ) 15 MG/ML injection 15 mg (15 mg Intravenous Given 11/30/24 2344)    Clinical Course as of 11/30/24 2352  Fri Nov 30, 2024  2304 IMPRESSION: 1. No acute intracranial  hemorrhage or ischemic change. 2. No significant stenosis, aneurysmal dilatation, or dissection involving the arteries of the head and neck.   [WF]    Clinical Course User Index [WF] Neldon Hamp RAMAN, [place]                                 Medical Decision Making Amount and/or Complexity of Data Reviewed Labs: ordered. Radiology: ordered. ECG/medicine tests: ordered.  Risk Prescription drug management.   This patient presents to the ED for concern of headache, this involves a number of treatment options, and is a complaint that carries with it a moderate risk of complications and morbidity. A differential diagnosis was considered for the patient's symptoms which is discussed below:   Emergent considerations for headache include subarachnoid hemorrhage, meningitis, temporal arteritis, glaucoma, cerebral ischemia, carotid/vertebral dissection, intracranial tumor, Venous sinus thrombosis, carbon monoxide poisoning, acute or chronic subdural hemorrhage.  Other considerations include: Migraine, Cluster headache, Hypertension, Caffeine, alcohol, or drug withdrawal, Pseudotumor cerebri, Arteriovenous malformation, Head injury, Neurocysticercosis, Post-lumbar puncture, Preeclampsia, Tension headache, Sinusitis, Cervical arthritis, Refractive error causing strain, Dental abscess, Otitis media, Temporomandibular joint syndrome, Depression, Somatoform disorder (eg, somatization) Trigeminal  neuralgia, Glossopharyngeal neuralgia.    Co morbidities: Discussed in HPI   Brief History:  Patient is a 53 year old female with past medical history significant for headaches she states she has a history of headaches with visual aura she states today she had an area of blurred vision in her right eye with onset of right-sided headache.  She denies any slurred speech, limb weakness or facial droop.  She states she felt a little disoriented during her headache.  She has taken no medications for her symptoms.  Denies any vertigo or vomiting. Does endorse some nausea.     EMR reviewed including pt PMHx, past surgical history and past visits to ER.   See HPI for more details   Lab Tests:   I ordered and independently interpreted labs. Labs notable for Mild hyponatremia of 131, CBC with erythrocytosis likely from dehydration.  Urinalysis without signs of infection which was obtained after IV hydration.  Troponin undetectable  Imaging Studies:  NAD. I personally reviewed all imaging studies and no acute abnormality found. I agree with radiology interpretation. Head and neck unremarkable   Cardiac Monitoring:  The patient was maintained on a cardiac monitor.  I personally viewed and interpreted the cardiac monitored which showed an underlying rhythm of: NSR EKG non-ischemic   Medicines ordered:  I ordered medication including Decadron , Toradol , 1 L normal saline, Benadryl , Reglan , magnesium  for headache Reevaluation of the patient after these medicines showed that the patient improved I have reviewed the patients home medicines and have made adjustments as needed   Critical Interventions:     Consults/Attending Physician      Reevaluation:  After the interventions noted above I re-evaluated patient and found that they have :resolved   Social Determinants of Health:      Problem List / ED Course:  Patient with brief episode of scotoma in the right eye,  neurologically intact and with headache.  Doubt TIA given her only symptoms were scotoma in right eye and headache which is consistent with a complex migraine/a migraine with visual aura.  She is neurologically intact, feels much improved after headache cocktail here in the emergency room.  I would recommend that she follow-up with neurology.  Return to ER for new or concerning symptoms.  Dispostion:  After consideration of the diagnostic results and the patients response to treatment, I feel that the patent would benefit from outpatient follow-up.   Final diagnoses:  Migraine with visual aura  Visual field scotoma of right eye    ED Discharge Orders          Ordered    Ambulatory referral to Neurology       Comments: An appointment is requested in approximately: 2 week   11/30/24 2350               Neldon Hamp RAMAN, [place] 11/30/24 2355    Bari Roxie HERO, DO 12/03/24 [id number]

## 2024-11-30 NOTE — ED Provider Triage Note (Signed)
 Emergency Medicine Provider Triage Evaluation Note  Pamlea Howard , a 53 y.o. female  was evaluated in triage.  Pt complains of headache. Pt states she has a headache and that is right side of head and states she had a scotoma (an aurora).   No slurred speech, no sx now except for headache  States she did feel somewhat confused when sx first started.   Review of Systems  Positive: headache Negative: Fever   Physical Exam  BP (!) 153/95 (BP Location: Right Arm)   Pulse 86   Temp 98.1 F (36.7 C) (Oral)   Resp 16   Ht 5' 4 (1.626 m)   Wt 60.3 kg   SpO2 100%   BMI 22.83 kg/m  Gen:   Awake, no distress   Resp:  Normal effort  MSK:   Moves extremities without difficulty  Other:    Alert and oriented to self, place, time and event.   Speech is fluent, clear without dysarthria or dysphasia.   Strength 5/5 in upper/lower extremities   Sensation intact in upper/lower extremities   Normal gait.  CN I not tested  CN II grossly intact visual fields bilaterally. Did not visualize posterior eye.  CN III, IV, VI PERRLA and EOMs intact bilaterally  CN V Intact sensation to sharp and light touch to the face  CN VII facial movements symmetric  CN VIII not tested  CN IX, X no uvula deviation, symmetric rise of soft palate  CN XI 5/5 SCM and trapezius strength bilaterally  CN XII Midline tongue protrusion, symmetric L/R movements    Medical Decision Making  Medically screening exam initiated at 7:27 PM.  Appropriate orders placed.  Candace Howard was informed that the remainder of the evaluation will be completed by another provider, this initial triage assessment does not replace that evaluation, and the importance of remaining in the ED until their evaluation is complete.  CT head and labs   Candace Howard, [place] 11/30/24 8070

## 2024-12-04 ENCOUNTER — Other Ambulatory Visit (HOSPITAL_COMMUNITY): Payer: Self-pay

## 2024-12-10 ENCOUNTER — Other Ambulatory Visit (HOSPITAL_COMMUNITY): Payer: Self-pay

## 2024-12-10 ENCOUNTER — Other Ambulatory Visit: Payer: Self-pay

## 2024-12-11 ENCOUNTER — Other Ambulatory Visit: Payer: Self-pay

## 2024-12-11 ENCOUNTER — Other Ambulatory Visit (HOSPITAL_COMMUNITY): Payer: Self-pay

## 2024-12-11 ENCOUNTER — Telehealth: Payer: Self-pay

## 2024-12-11 ENCOUNTER — Ambulatory Visit

## 2024-12-11 NOTE — Telephone Encounter (Signed)
 Pharmacy Patient Advocate Encounter  Received notification from [employer] that Prior Authorization for JARDIANCE  has been APPROVED from 12/11/2024 to 12/11/2025   PA #/Case ID/Reference #: [id number]

## 2024-12-17 ENCOUNTER — Other Ambulatory Visit (HOSPITAL_COMMUNITY): Payer: Self-pay

## 2024-12-17 ENCOUNTER — Other Ambulatory Visit: Payer: Self-pay

## 2024-12-21 ENCOUNTER — Ambulatory Visit: Payer: Self-pay

## 2024-12-21 ENCOUNTER — Encounter: Admitting: Obstetrics and Gynecology

## 2024-12-21 ENCOUNTER — Other Ambulatory Visit: Payer: Self-pay

## 2024-12-21 NOTE — Telephone Encounter (Signed)
 Noted

## 2024-12-21 NOTE — Telephone Encounter (Signed)
 FYI Only or Action Required?: Action required by provider: request for appointment.  Patient was last seen in primary care on 11/13/2024 by Theotis Haze ORN, NP.  Called Nurse Triage reporting Migraine.  Symptoms began several weeks ago.  Interventions attempted: OTC medications: Advil  today.  Symptoms are: unchanged.Appointment made for Monday. Asking for migraine medicine to be called in or something for pain. Please advise pt. See ED note 11/30/24.  Triage Disposition: See Physician Within 24 Hours  Patient/caregiver understands and will follow disposition?: Yes        Copied from CRM [id number]. Topic: Clinical - Red Word Triage >> Dec 21, 2024  2:47 PM Amy B wrote: Red Word that prompted transfer to Nurse Triage: Migraine headache, nausea, vision loss Reason for Disposition  [1] MODERATE headache (e.g., interferes with normal activities) AND [2] present > 24 hours AND [3] unexplained  (Exceptions: Pain medicines not tried, typical migraine, or headache part of viral illness.)  Answer Assessment - Initial Assessment Questions 1. LOCATION: Where does it hurt?      front 2. ONSET: When did the headache start? (e.g., minutes, hours, days)      2 weeks 3. PATTERN: Does the pain come and go, or has it been constant since it started?     constant 4. SEVERITY: How bad is the pain? and What does it keep you from doing?  (e.g., Scale 1-10; mild, moderate, or severe)     6-7 5. RECURRENT SYMPTOM: Have you ever had headaches before? If Yes, ask: When was the last time? and What happened that time?      yes 6. CAUSE: What do you think is causing the headache?     migraines 7. MIGRAINE: Have you been diagnosed with migraine headaches? If Yes, ask: Is this headache similar?      yes 8. HEAD INJURY: Has there been any recent injury to your head?      no 9. OTHER SYMPTOMS: Do you have any other symptoms? (e.g., fever, stiff neck, eye pain, sore throat, cold  symptoms)     no 10. PREGNANCY: Is there any chance you are pregnant? When was your last menstrual period?       no  Protocols used: Headache-A-AH

## 2024-12-21 NOTE — Telephone Encounter (Signed)
 Please call patient and schedule with any provider for HFU

## 2024-12-24 ENCOUNTER — Ambulatory Visit: Payer: Self-pay | Admitting: Family

## 2024-12-24 ENCOUNTER — Other Ambulatory Visit (HOSPITAL_COMMUNITY): Payer: Self-pay

## 2024-12-24 VITALS — BP 122/76 | HR 86 | Temp 98.5°F | Resp 16 | Ht 64.0 in | Wt 132.8 lb

## 2024-12-24 DIAGNOSIS — R519 Headache, unspecified: Secondary | ICD-10-CM

## 2024-12-24 MED ORDER — SUMATRIPTAN SUCCINATE 25 MG PO TABS
ORAL_TABLET | ORAL | 0 refills | Status: AC
  Filled 2024-12-24: qty 9, 30d supply, fill #0

## 2024-12-24 NOTE — Progress Notes (Signed)
 Patient having migraine every other day, patient losing vision in both eyes
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
## 2024-12-28 ENCOUNTER — Other Ambulatory Visit (HOSPITAL_COMMUNITY): Payer: Self-pay

## 2024-12-29 ENCOUNTER — Other Ambulatory Visit (HOSPITAL_COMMUNITY): Payer: Self-pay

## 2025-01-01 ENCOUNTER — Ambulatory Visit

## 2025-01-03 ENCOUNTER — Encounter: Payer: Self-pay | Admitting: Pharmacist

## 2025-01-03 ENCOUNTER — Ambulatory Visit: Payer: Self-pay | Attending: Family Medicine | Admitting: Pharmacist

## 2025-01-03 DIAGNOSIS — Z7984 Long term (current) use of oral hypoglycemic drugs: Secondary | ICD-10-CM

## 2025-01-03 DIAGNOSIS — E1169 Type 2 diabetes mellitus with other specified complication: Secondary | ICD-10-CM
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
## 2025-01-04 ENCOUNTER — Other Ambulatory Visit (HOSPITAL_COMMUNITY): Payer: Self-pay

## 2025-01-05 ENCOUNTER — Other Ambulatory Visit (HOSPITAL_COMMUNITY): Payer: Self-pay

## 2025-01-07 NOTE — Progress Notes (Signed)
 Geoffrey Mankin                                          MRN: [id number]   01/07/2025   The [facility] Quality Team Specialist reviewed this patient medical record for the purposes of chart review for care gap closure. The following were reviewed: abstraction for care gap closure-glycemic status assessment.  Corleen Otwell                                          MRN: [id number]   01/08/2025   The [facility] Quality Team Specialist reviewed this patient medical record for the purposes of chart review for care gap closure. The following were reviewed: chart review for care gap closure-diabetic eye exam.    [facility] Quality Team    [facility] Quality Team

## 2025-01-12 ENCOUNTER — Other Ambulatory Visit (HOSPITAL_COMMUNITY): Payer: Self-pay

## 2025-01-24 ENCOUNTER — Encounter (HOSPITAL_COMMUNITY): Payer: Self-pay | Admitting: Psychiatry

## 2025-01-24 ENCOUNTER — Telehealth (HOSPITAL_COMMUNITY): Payer: Self-pay | Admitting: Psychiatry

## 2025-01-24 ENCOUNTER — Other Ambulatory Visit (HOSPITAL_COMMUNITY): Payer: Self-pay

## 2025-01-24 DIAGNOSIS — F411 Generalized anxiety disorder: Secondary | ICD-10-CM

## 2025-01-24 DIAGNOSIS — F3160 Bipolar disorder, current episode mixed, unspecified: Secondary | ICD-10-CM

## 2025-01-24 MED ORDER — RISPERIDONE 3 MG PO TABS
3.0000 mg | ORAL_TABLET | Freq: Every day | ORAL | 3 refills | Status: AC
  Filled 2025-01-24: qty 30, 30d supply, fill #0

## 2025-01-24 MED ORDER — GABAPENTIN 100 MG PO CAPS: 100.0000 mg | ORAL_CAPSULE | Freq: Every day | ORAL | 3 refills | Status: AC

## 2025-01-24 MED ORDER — TRAZODONE HCL 150 MG PO TABS
150.0000 mg | ORAL_TABLET | Freq: Every evening | ORAL | 3 refills | Status: AC | PRN
  Filled 2025-01-24: qty 30, 30d supply, fill #0

## 2025-01-24 NOTE — Progress Notes (Signed)
 [facility] MD/PA/NP OP Progress Note Virtual Visit via Video Note  I connected with Candace Howard on 01/24/25 at  2:00 PM EST by a video enabled telemedicine application and verified that I am speaking with the correct person using two identifiers.  Location: Patient: Work Provider: Clinic   I discussed the limitations of evaluation and management by telemedicine and the availability of in person appointments. The patient expressed understanding and agreed to proceed.  I provided 30 minutes of non-face-to-face time during this encounter.   01/24/2025 2:21 PM Candace Howard  MRN:  [id number]  Chief Complaint:Things are difficult   HPI: 54 year old female seen today for follow-up psychiatric evaluation.  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  Currently she is being managed on  Risperdal  3 mg nightly, gabapentin  100 mg 3 times daily (reports that she became addicted to higher doses), and trazodone  150 mg nightly as needed.  Today she reports her medications are effective in managing her psychiatric condition.  Today she was well-groomed, pleasant, cooperative, and engaged in conversation.  Patient informed clinical research associate that things are difficult. She notes that she may have to move as has gotten an eviction notice. She notes that her roommate moved out. She also notes that her car has been acting up. She is worried about finances. She has been spending conservatively.   Patient notes that to cope she smokes over a pack of cigarettes daily. At this she is notes that she is not  interested in Nicorette  patch or gum.  Patient reports that the above worsen her anxiety and depression. Today provider conducted a GAD 7 and patient scored an 9, at her last visit she scored an 8. Provider also conducted a PHQ 9 and patient scored a 9, at her last visit she scored a 5. She endorses adequate  sleep and appetite.    At this time no medication changes made today. Patient agreeable to continue medications as prescribed. Provider sent patient resources for rental assistance.  No other concerns at this time.    Visit Diagnosis:    ICD-10-CM   1. Anxiety state  F41.1 risperiDONE  (RISPERDAL ) 3 MG tablet    gabapentin  (NEURONTIN ) 100 MG capsule    2. Bipolar affective disorder, current episode mixed, without psychotic features (HCC)  F31.60 risperiDONE  (RISPERDAL ) 3 MG tablet    gabapentin  (NEURONTIN ) 100 MG capsule            Past Psychiatric History: substance use (Alcohol, Tobacco, meth, and cocaine) Bipolar affective disorder, Depression, anxiety, and PTSD  Past Medical History:  Past Medical History:  Diagnosis Date   Anemia    Anxiety    Arthritis    Asthma    Chest pain    COPD (chronic obstructive pulmonary disease) (HCC)    Depression    Difficulty breathing    Frequent headaches    History of hepatitis C    IBS (irritable bowel syndrome)    Palpitations    Pancreatitis    Poor circulation     Past Surgical History:  Procedure Laterality Date   CARPAL TUNNEL RELEASE Bilateral    COLONOSCOPY     around age 37. [facility] Larene said they removed polyps   ESOPHAGOGASTRODUODENOSCOPY N/A 06/25/2024   Procedure: EGD (ESOPHAGOGASTRODUODENOSCOPY);  Surgeon: Wilhelmenia Aloha Raddle., MD;  Location: [facility];  Service: Gastroenterology;  Laterality: N/A;   EUS N/A 06/25/2024   Procedure:  ULTRASOUND, UPPER GI TRACT, ENDOSCOPIC;  Surgeon: Mansouraty, Aloha Raddle., MD;  Location: [facility];  Service: Gastroenterology;  Laterality: N/A;   SKIN GRAFT Bilateral     Family Psychiatric History:  Son Substance use, maternal 4 uncles SI, mother abuse of pill use   Family History:  Family History  Problem Relation Age of Onset   Colon cancer Mother    Bladder Cancer Mother        initially and spreaded to the colon and all over per patient   COPD  Mother    Glaucoma Mother    Irritable bowel syndrome Mother    Heart disease Father    Hypertension Father    Diabetes Father    Diabetes Paternal Grandmother     Social History:  Social History   Socioeconomic History   Marital status: Single    Spouse name: Not on file   Number of children: Not on file   Years of education: Not on file   Highest education level: GED or equivalent  Occupational History   Not on file  Tobacco Use   Smoking status: Every Day    Current packs/day: 0.50    Average packs/day: 0.5 packs/day for 35.0 years (17.5 ttl pk-yrs)    Types: Cigarettes   Smokeless tobacco: Never   Tobacco comments:    Pt states she started back smoking 1 month ago and is smoking 1 ppd. ALS 03/31/23  Vaping Use   Vaping status: Never Used  Substance and Sexual Activity   Alcohol use: Not Currently    Comment: 2 months sober.   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   Sexual activity: Not Currently  Other Topics Concern   Not on file  Social History Narrative   Not on file   Social Drivers of Health   Tobacco Use: High Risk (01/03/2025)   Patient History    Smoking Tobacco Use: Every Day    Smokeless Tobacco Use: Never    Passive Exposure: Not on file  Financial Resource Strain: High Risk (12/24/2024)   Overall Financial Resource Strain (CARDIA)    Difficulty of Paying Living Expenses: Hard  Food Insecurity: Food Insecurity Present (12/24/2024)   Epic    Worried About Programme Researcher, Broadcasting/film/video in the Last Year: Often true    Ran Out of Food in the Last Year: Sometimes true  Transportation Needs: No Transportation Needs (12/24/2024)   Epic    Lack of Transportation (Medical): No    Lack of Transportation (Non-Medical): No  Physical Activity: Insufficiently Active (12/24/2024)   Exercise Vital Sign    Days of Exercise per Week: 1 day    Minutes of Exercise per Session: 10 min  Stress: No Stress Concern Present (12/24/2024)   [employer] - Occupational Stress Questionnaire    Feeling of Stress: Only a little  Social Connections: Unknown (12/24/2024)   Social Connection and Isolation Panel    Frequency of Communication with Friends and Family: Once a week    Frequency of Social Gatherings with Friends and Family: Three times a week    Attends Religious Services: Patient declined    Active Member of Clubs or Organizations: Patient declined    Attends Banker Meetings: Not on file    Marital Status: Never married  Depression (PHQ2-9): Medium Risk (01/24/2025)   Depression (PHQ2-9)    PHQ-2 Score: 9  Alcohol Screen: Not on file  Housing:  High Risk (12/24/2024)   Epic    Unable to Pay for Housing in the Last Year: Yes    Number of Times Moved in the Last Year: 0    Homeless in the Last Year: No  Utilities: Low Risk (04/28/2023)   Received from [facility]   Utilities    In the past 12 months has the electric, gas, oil, or water company threatened to shut off services in your home? : No  Health Literacy: Not on file    Allergies:  Allergies  Allergen Reactions   Hydrocodone  Other (See Comments)    Other reaction(s): Unknown/See Comments Feel really high    Metabolic Disorder Labs: Lab Results  Component Value Date   HGBA1C 7.7 (A) 11/13/2024   MPG 108.28 12/25/2021   No results found for: PROLACTIN Lab Results  Component Value Date   CHOL 153 11/13/2024   TRIG 155 (H) 11/13/2024   HDL 31 (L) 11/13/2024   CHOLHDL 4.9 (H) 11/13/2024   VLDL 23 12/27/2021   LDLCALC 95 11/13/2024   LDLCALC 110 (H) 12/27/2021   Lab Results  Component Value Date   TSH 0.636 12/25/2021    Therapeutic Level Labs: No results found for: LITHIUM No results found for: VALPROATE No results found for: CBMZ  Current Medications: Current Outpatient Medications  Medication Sig Dispense Refill   Accu-Chek Softclix Lancets lancets Use as instructed (Patient not taking: Reported  on 11/13/2024) 100 each 12   albuterol  (VENTOLIN  HFA) 108 (90 Base) MCG/ACT inhaler Inhale 2 puffs into the lungs every 6 (six) hours as needed for wheezing or shortness of breath.     Blood Glucose Monitoring Suppl (ACCU-CHEK GUIDE) w/Device KIT use as directed once daily (Patient not taking: Reported on 11/13/2024) 1 kit 0   Blood Glucose Monitoring Suppl (TRUE METRIX METER) w/Device KIT Use to check blood sugar 2 (two) times daily. (Patient not taking: Reported on 11/13/2024) 1 kit 0   budesonide -formoterol  (SYMBICORT ) 160-4.5 MCG/ACT inhaler Inhale 2 puffs into the lungs 2 (two) times daily. 10.2 g 3   empagliflozin  (JARDIANCE ) 10 MG TABS tablet Take 1 tablet (10 mg total) by mouth daily. For diabetes 90 tablet 1   gabapentin  (NEURONTIN ) 100 MG capsule Take 1 capsule (100 mg total) by mouth at bedtime. 90 capsule 3   glucose blood (ACCU-CHEK GUIDE) test strip use  2 (two) times daily as instructed (Patient not taking: Reported on 11/13/2024) 100 each 12   glucose blood (TRUE METRIX BLOOD GLUCOSE TEST) test strip Use as instructed (Patient not taking: Reported on 11/13/2024) 100 each 12   hydrocortisone  (ANUSOL -HC) 2.5 % rectal cream Place 1 Application rectally 2 (two) times daily. 30 g 1   lipase/protease/amylase (CREON ) 36000 UNITS CPEP capsule Take 2 capsules (72,000 Units total) by mouth 3 (three) times daily with meals. May also take 1 capsule (36,000 Units total) as needed (with snacks - up to 4 snacks daily). (Patient not taking: No sig reported)     nystatin  (MYCOSTATIN ) 100000 UNIT/ML suspension Take 5 mLs (500,000 Units total) by mouth 4 (four) times daily. 120 mL 0   pantoprazole  (PROTONIX ) 40 MG tablet Take 1 tablet (40 mg total) by mouth daily. 90 tablet 1   risperiDONE  (RISPERDAL ) 3 MG tablet Take 1 tablet (3 mg total) by mouth daily. 30 tablet 3   SUMAtriptan  (IMITREX ) 25 MG tablet Take 25 mg (1 tablet total) by mouth at the start of the headache. May repeat in 2 hours x 1 if  headache persists. Max of 2 tablets/24 hours. 90 tablet 0   traZODone  (DESYREL ) 150 MG tablet Take 1 tablet (150 mg total) by mouth at bedtime as needed. 30 tablet 3   TRUEplus Lancets 28G MISC Use to check blood sugar 2 (two) times daily. (Patient not taking: Reported on 11/13/2024) 100 each 3   No current facility-administered medications for this visit.     Musculoskeletal: Strength & Muscle Tone: within normal limits telehealth visit Gait & Station: normal telehealth visit Patient leans: N/A  Psychiatric Specialty Exam: Review of Systems  There were no vitals taken for this visit.There is no height or weight on file to calculate BMI.  General Appearance: Well Groomed  Eye Contact:  Good  Speech:  Clear and Coherent and Normal Rate  Volume:  Normal  Mood:  Euthymic  Affect:  Appropriate and Congruent  Thought Process:  Coherent, Goal Directed, and Linear  Orientation:  Full (Time, Place, and Person)  Thought Content: WDL   Suicidal Thoughts:  No  Homicidal Thoughts:  No  Memory:  Immediate;   Good Recent;   Good Remote;   Good  Judgement:  Good  Insight:  Good  Psychomotor Activity:  Normal  Concentration:  Concentration: Good and Attention Span: Good  Recall:  Good  Fund of Knowledge: Good  Language: Good  Akathisia:  No  Handed:  Left  AIMS (if indicated): not done  Assets:  Communication Skills Desire for Improvement Financial Resources/Insurance Housing Physical Health Social Support  ADL's:  Intact  Cognition: WNL  Sleep:  Good   Screenings: AIMS    Flowsheet Row Clinical Support from 01/11/2022 in [facility]  AIMS Total Score 0 0   AUDIT    Flowsheet Row Ad[facility] (Discharged[facility]  Alcohol Use Disorder Identification Test Final Score (AUDIT) 0   GAD-7    Flowsheet Row Video  Visit from 01/24/2025 in [facility] Video Visit from 11/20/2024 in [facility] Video Visit from 05/30/2024 in [facility] Video Visit from 04/11/2024 in [facility] Video Visit from 01/18/2024 in [facility]  Total GAD-7 Score 9 8 14 8 6    PHQ2-9    Flowsheet Row Video Visit from 01/24/2025 in [facility] Office Visit from 12/24/2024 in [facility] Video Visit from 11/20/2024 in [facility] Video Visit from 05/30/2024 in [facility] Video Visit from 04/11/2024 in [facility]  PHQ-2 Total Score 3 0 0 3 1  PHQ-9 Total Score 9 -- 5 15 3    Flowsheet Row Video Visit from 01/24/2025 in [facility] ED from 11/30/2024 in [facility] Emergency Department at [facility] Video Visit from 05/30/2024 in [facility]  C-SSRS RISK CATEGORY Error: Q7 should not be populated when Q6 is No No Risk Error: Q7 should not be populated when Q6 is No     Assessment and Plan: Patient endorses mild anxiety and depression due to situational stressors.  At this time no medication changes made.  Patient agreeable to continue medication as prescribed. Provider sent patient resources for rental assistance. 1. Anxiety state  Continue- risperiDONE  (RISPERDAL ) 3 MG tablet; Take 1 tablet (3 mg total) by mouth daily.  Dispense: 30 tablet; Refill: 3 Continue- gabapentin  (NEURONTIN ) 100 MG capsule; Take 1 capsule (100 mg total) by mouth at bedtime.  Dispense: 90 capsule; Refill: 3  2. Bipolar affective disorder, current episode mixed, without psychotic features (HCC)  Continue- risperiDONE  (RISPERDAL ) 3 MG tablet; Take 1 tablet (3 mg total) by mouth daily.  Dispense: 30 tablet; Refill:  3 Continue- gabapentin  (NEURONTIN ) 100 MG capsule; Take 1 capsule (100 mg total) by mouth at bedtime.  Dispense: 90 capsule; Refill: 3  Collaboration of Care: Collaboration of Care: Other provider involved in patient's care AEB PCP  Patient/Guardian was advised Release of Information must be obtained prior to any record release in order to collaborate their care with an outside provider. Patient/Guardian was advised if they have not already done so to contact the registration department to sign all necessary forms in order for us  to release information regarding their care.   Consent: Patient/Guardian gives verbal consent for treatment and assignment of benefits for services provided during this visit. Patient/Guardian expressed understanding and agreed to proceed.   Follow-up in 2.5 months Zane FORBES Bach, NP 01/24/2025, 2:21 PM

## 2025-01-28 ENCOUNTER — Encounter: Admitting: Obstetrics and Gynecology

## 2025-02-13 ENCOUNTER — Ambulatory Visit: Admitting: Family Medicine

## 2025-03-07 ENCOUNTER — Institutional Professional Consult (permissible substitution): Admitting: Neurology

## 2025-04-03 ENCOUNTER — Telehealth (HOSPITAL_COMMUNITY): Admitting: Psychiatry
